# Patient Record
Sex: Male | Born: 1940 | Race: White | Hispanic: No | Smoking: Former smoker
Health system: Southern US, Community
[De-identification: ages and names within clinical notes are randomized; demographics above are authoritative.]

## PROBLEM LIST (undated history)

## (undated) DIAGNOSIS — E559 Vitamin D deficiency, unspecified: Secondary | ICD-10-CM

## (undated) DIAGNOSIS — E875 Hyperkalemia: Secondary | ICD-10-CM

## (undated) DIAGNOSIS — IMO0001 Reserved for inherently not codable concepts without codable children: Secondary | ICD-10-CM

## (undated) DIAGNOSIS — I10 Essential (primary) hypertension: Secondary | ICD-10-CM

## (undated) DIAGNOSIS — Q539 Undescended testicle, unspecified: Secondary | ICD-10-CM

## (undated) DIAGNOSIS — N184 Chronic kidney disease, stage 4 (severe): Secondary | ICD-10-CM

## (undated) DIAGNOSIS — E119 Type 2 diabetes mellitus without complications: Secondary | ICD-10-CM

## (undated) DIAGNOSIS — C61 Malignant neoplasm of prostate: Secondary | ICD-10-CM

## (undated) HISTORY — DX: Undescended testicle, unspecified: Q53.9

## (undated) HISTORY — PX: COLONOSCOPY: SHX174

## (undated) HISTORY — DX: Essential (primary) hypertension: I10

## (undated) HISTORY — DX: Malignant neoplasm of prostate: C61

## (undated) HISTORY — DX: Reserved for inherently not codable concepts without codable children: IMO0001

## (undated) HISTORY — DX: Hyperkalemia: E87.5

## (undated) HISTORY — DX: Vitamin D deficiency, unspecified: E55.9

## (undated) HISTORY — DX: Type 2 diabetes mellitus without complications: E11.9

---

## 1999-01-24 HISTORY — PX: REFRACTIVE SURGERY: SHX103

## 2008-05-25 HISTORY — PX: FOOT AMPUTATION: SHX951

## 2009-01-30 ENCOUNTER — Ambulatory Visit: Payer: Self-pay | Admitting: Family Medicine

## 2009-01-30 ENCOUNTER — Inpatient Hospital Stay (HOSPITAL_COMMUNITY): Admission: EM | Admit: 2009-01-30 | Discharge: 2009-02-08 | Payer: Self-pay | Admitting: Family Medicine

## 2009-02-01 ENCOUNTER — Encounter (INDEPENDENT_AMBULATORY_CARE_PROVIDER_SITE_OTHER): Payer: Self-pay | Admitting: Orthopedic Surgery

## 2009-02-06 ENCOUNTER — Encounter (INDEPENDENT_AMBULATORY_CARE_PROVIDER_SITE_OTHER): Payer: Self-pay | Admitting: Orthopedic Surgery

## 2009-12-10 ENCOUNTER — Encounter: Payer: Self-pay | Admitting: Gastroenterology

## 2010-01-06 ENCOUNTER — Encounter (INDEPENDENT_AMBULATORY_CARE_PROVIDER_SITE_OTHER): Payer: Self-pay | Admitting: *Deleted

## 2010-01-09 ENCOUNTER — Encounter (INDEPENDENT_AMBULATORY_CARE_PROVIDER_SITE_OTHER): Payer: Self-pay | Admitting: *Deleted

## 2010-01-09 ENCOUNTER — Ambulatory Visit: Payer: Self-pay | Admitting: Gastroenterology

## 2010-01-23 ENCOUNTER — Ambulatory Visit: Payer: Self-pay | Admitting: Gastroenterology

## 2010-02-03 ENCOUNTER — Telehealth: Payer: Self-pay | Admitting: Gastroenterology

## 2010-02-04 ENCOUNTER — Ambulatory Visit: Payer: Self-pay | Admitting: Gastroenterology

## 2010-02-04 ENCOUNTER — Encounter (INDEPENDENT_AMBULATORY_CARE_PROVIDER_SITE_OTHER): Payer: Self-pay | Admitting: *Deleted

## 2010-02-18 ENCOUNTER — Ambulatory Visit: Payer: Self-pay | Admitting: Gastroenterology

## 2010-02-19 LAB — HM COLONOSCOPY

## 2010-06-24 NOTE — Procedures (Signed)
Summary: Colonoscopy  Patient: Phillip Herring Note: All result statuses are Final unless otherwise noted.  Tests: (1) Colonoscopy (COL)   COL Colonoscopy           DONE     [facility]     [address]     [place], [place]  [address]           COLONOSCOPY PROCEDURE REPORT           PATIENT:  [name], [name]  MR#:  [id number]     BIRTHDATE:  1941-02-01, 69 yrs. old  GENDER:  male     ENDOSCOPIST:  Judie Petit T. Russella Dar, MD, [facility]     Referred by:  Lynnea Ferrier, M.D.     PROCEDURE DATE:  02/18/2010     PROCEDURE:  Average-risk screening colonoscopy G0121     ASA CLASS:  Class II     INDICATIONS:  1) Routine Risk Screening     MEDICATIONS:   Fentanyl 50 mcg IV, Versed 8 mg IV     DESCRIPTION OF PROCEDURE:   After the risks benefits and     alternatives of the procedure were thoroughly explained, informed     consent was obtained.  Digital rectal exam was performed and     revealed no abnormalities.  The LB PCF-H180AL [id number] endoscope     was introduced through the anus and advanced to the cecum, which     was identified by both the appendix and ileocecal valve, limited     by a tortuous and redundant colon, fair prep.    The quality of     the prep was fair, using Colyte, after extensive rinsing and     suctioning during the procedure. The instrument was then slowly     withdrawn as the colon was fully examined.     <<PROCEDUREIMAGES>>     FINDINGS:  A normal appearing cecum, ileocecal valve, and     appendiceal orifice were identified. The ascending, hepatic     flexure, transverse, splenic flexure, descending, sigmoid colon,     and rectum appeared unremarkable.   Retroflexed views in the     rectum revealed no abnormalities.    The time to cecum =  16.67     minutes. The scope was then withdrawn (time =  18  min) from the     patient and the procedure completed.           COMPLICATIONS:  None           ENDOSCOPIC IMPRESSION:     1) Normal colon            RECOMMENDATIONS:     1) Repeat Colonoscopy in 5 years for routine CRC screening, due     to fair prep, with an extended bowel prep     2) High fiber diet with liberal fluid intake.           Venita Lick. Russella Dar, MD, [facility]           n.     eSIGNED:   Venita Lick. Stark at 02/18/2010 03:29 PM           Hammack, [name], [id number]  Note: An exclamation mark (!) indicates a result that was not dispersed into the flowsheet. Document Creation Date: 02/18/2010 3:30 PM _______________________________________________________________________  (1) Order result status: Final Collection or observation date-time: 02/18/2010 15:25 Requested date-time:  Receipt date-time:  Reported date-time:  Referring Physician:   Ordering Physician: Judie Petit  Russella Dar [id number]) Specimen Source:  Source: Launa Grill Order Number: [id number] Lab site:   Appended Document: Colonoscopy    Clinical Lists Changes  Observations: Added new observation of COLONNXTDUE: 01/2015 (02/18/2010 16:00)

## 2010-06-24 NOTE — Miscellaneous (Signed)
Summary: [facility] Previsit/prep  Clinical Lists Changes  Medications: Added new medication of COLYTE WITH FLAVOR PACKS 240 GM  SOLR (PEG 3350-KCL-NABCB-NACL-NASULF) As per prep instructions. - Signed Rx of COLYTE WITH FLAVOR PACKS 240 GM  SOLR (PEG 3350-KCL-NABCB-NACL-NASULF) As per prep instructions.;  #1 x 0;  Signed;  Entered by: Wyona Almas RN;  Authorized by: Meryl Dare MD [facility];  Method used: [facility]. [phone]*, [address], [place], [place], [place]  [address], Ph: [phone] or [phone], Fax: [phone] Observations: Added new observation of NKA: T (02/04/2010 13:48)    Prescriptions: COLYTE WITH FLAVOR PACKS 240 GM  SOLR (PEG 3350-KCL-NABCB-NACL-NASULF) As per prep instructions.  #1 x 0   Entered by:   Wyona Almas RN   Authorized by:   Meryl Dare MD [facility]   Signed by:   Wyona Almas RN on 02/04/2010   Method used:   Electronically to        [facility]. [phone]* (retail)       [address]       [place], [place]  [address]       Ph: [phone] or [phone]       Fax: [phone]   RxID:   [id number]

## 2010-06-24 NOTE — Letter (Signed)
Summary: Previsit letter  [facility]  [address] [place], [place] [address]   Phone: [phone]  Fax: [phone]       12/10/2009 MRN: [id number]  [facility] [address] [place], [place]  [address]  Dear Mr. Berringer,  Welcome to the Gastroenterology Division at [facility].    You are scheduled to see a nurse for your pre-procedure visit on 01-09-10 at 9am on the 3rd floor at [facility], [address]  We ask that you try to arrive at our office 15 minutes prior to your appointment time to allow for check-in.  Your nurse visit will consist of discussing your medical and surgical history, your immediate family medical history, and your medications.    Please bring a complete list of all your medications or, if you prefer, bring the medication bottles and we will list them.  We will need to be aware of both prescribed and over the counter drugs.  We will need to know exact dosage information as well.  If you are on blood thinners (Coumadin, Plavix, Aggrenox, Ticlid, etc.) please call our office today/prior to your appointment, as we need to consult with your physician about holding your medication.   Please be prepared to read and sign documents such as consent forms, a financial agreement, and acknowledgement forms.  If necessary, and with your consent, a friend or relative is welcome to sit-in on the nurse visit with you.  Please bring your insurance card so that we may make a copy of it.  If your insurance requires a referral to see a specialist, please bring your referral form from your primary care physician.  No co-pay is required for this nurse visit.     If you cannot keep your appointment, please call [phone] to cancel or reschedule prior to your appointment date.  This allows [place] the opportunity to schedule an appointment for another patient in need of care.    Thank you for choosing [facility] for your medical needs.  We  appreciate the opportunity to care for you.  Please visit [place] at our website  to learn more about our practice.                     Sincerely.                                                                                                                   The Gastroenterology Division

## 2010-06-24 NOTE — Letter (Signed)
Summary: Diabetic Instructions  [facility]  [address] [place], [place] [address]   Phone: [phone]  Fax: [phone]    KYI Leard March 03, 1941 MRN: [id number]   _X  _   ORAL DIABETIC MEDICATION INSTRUCTIONS  The day before your procedure:   Take your diabetic pill as you do normally  The day of your procedure:   Do not take your diabetic pill    We will check your blood sugar levels during the admission process and again in Recovery before discharging you home  ________________________________________________________________________

## 2010-06-24 NOTE — Procedures (Signed)
Summary: Colonoscopy  Patient: Phillip Herring Note: All result statuses are Final unless otherwise noted.  Tests: (1) Colonoscopy (COL)   COL Colonoscopy           DONE (C)     [facility]     [address]     [place], [place]  [address]           COLONOSCOPY PROCEDURE REPORT           PATIENT:  [name], [name]  MR#:  [id number]     BIRTHDATE:  1941/02/22, 69 yrs. old  GENDER:  male     ENDOSCOPIST:  Judie Petit T. Russella Dar, MD, [facility]     Referred by:  Lynnea Ferrier, M.D.     PROCEDURE DATE:  01/23/2010     PROCEDURE:  Colonoscopy 09[address]     ASA CLASS:  Class II     INDICATIONS:  1) Routine Risk Screening     MEDICATIONS:   Fentanyl 50 mcg IV, Versed 6 mg IV     DESCRIPTION OF PROCEDURE:   After the risks benefits and     alternatives of the procedure were thoroughly explained, informed     consent was obtained.  Digital rectal exam was performed and     revealed no abnormalities.  The LB PCF-H180AL [id number] endoscope     was introduced through the anus and advanced to the mid transverse     colon, limited by poor preparation.  The quality of the prep was     poor, using MoviPrep. The instrument was then slowly withdrawn as     the colon was examined.     <<PROCEDUREIMAGES>>     FINDINGS:  A normal appearing transverse, splenic flexure,     descending, sigmoid colon, and rectum appeared unremarkable     however the views were severely limited by retained solid and     liquid stool such that the exam was stopped at the transverse     colon. Although large lesions in the sigmoid and rectum were     adeqautely excluded, the exam from the descending to the mid     transverse was inadequate.  Retroflexed views in the rectum     revealed no abnormalities. The time to cecum =  minutes. The scope     was then withdrawn (time =  min) from the patient and the     procedure completed.           COMPLICATIONS:  None           ENDOSCOPIC IMPRESSION:     1) Incomplete colonoscopy exam due to  a poor prep           RECOMMENDATIONS:     1) Repeat Colonoscopy with a more extensive bowel prep           Malcolm T. Russella Dar, MD, [facility]           n.     REVISED:  01/28/2010 08:36 AM     eSIGNED:   Venita Lick. Stark at 01/28/2010 08:36 AM           [name], [name], [phone]  Note: An exclamation mark (!) indicates a result that was not dispersed into the flowsheet. Document Creation Date: 01/28/2010 8:37 AM _______________________________________________________________________  (1) Order result status: Final Collection or observation date-time: 01/23/2010 12:05 Requested date-time:  Receipt date-time:  Reported date-time:  Referring Physician:   Ordering Physician: Claudette Head [phone]) Specimen Source:  Source: End[facility]  Filler Order Number: [id number] Lab site:

## 2010-06-24 NOTE — Miscellaneous (Signed)
Summary: [facility] Previsit/prep  Clinical Lists Changes  Medications: Added new medication of MOVIPREP 100 GM  SOLR (PEG-KCL-NACL-NASULF-NA ASC-C) As per prep instructions. - Signed Rx of MOVIPREP 100 GM  SOLR (PEG-KCL-NACL-NASULF-NA ASC-C) As per prep instructions.;  #1 x 0;  Signed;  Entered by: Wyona Almas RN;  Authorized by: Meryl Dare MD [facility];  Method used: [facility] to [facility]. [phone]*, [address], [place], [place], [place]  [address], Ph: [phone] or [phone], Fax: [phone] Observations: Added new observation of NKA: T (01/09/2010 8:54)    Prescriptions: MOVIPREP 100 GM  SOLR (PEG-KCL-NACL-NASULF-NA ASC-C) As per prep instructions.  #1 x 0   Entered by:   Wyona Almas RN   Authorized by:   Meryl Dare MD [facility]   Signed by:   Wyona Almas RN on 01/09/2010   Method used:   Electronically to        [facility]. [phone]* (retail)       [address]       [place], [place]  [address]       Ph: [phone] or [phone]       Fax: [phone]   RxID:   [id number]

## 2010-06-24 NOTE — Progress Notes (Signed)
Summary: more extensive bowel prep  Phone Note Other Incoming   Summary of Call: Mag Citirate, one bottle, in the evening 3 days before colonoscopy Clear liquid diet and one bottle of Mag Citrate starting after 4pm on the day before the full day of clear liquids, 2 days before colonoscopy Mag Citrate the morning of the full day of clear liquids, 1 day before colonoscopy GoLytely one gallon over 3-4 hours the evening before colonoscopy If stool not completely clear after above take one bottle of Mag Citrate 4-6 hours before the colonoscopy Initial call taken by: Meryl Dare MD FACG,  February 03, 2010 8:08 PM    Dr. Russella Dar, Mr. Seymour is coming in for his previsit tomorrow 02/04/10 at 2:00pm.  You said in his colon report to give him more extensive prep this time.  What do you suggest?  Go-Lytely with dulcolax, and extra day of clear liquids, Two day prep with Miralax 2 days before and Movi the day before and morning of exam ...? Thank you, Alvino Chapel

## 2010-06-24 NOTE — Letter (Signed)
Summary: [facility] Instructions  [facility]  [address] [place], [place] [address]   Phone: [phone]  Fax: [phone]       Phillip Herring    1941-04-15    MRN: [id number]       Procedure Day Dorna Bloom:  Jake Shark  02/18/10     Arrival Time: 2:00PM     Procedure Time:  3:00PM     Location of Procedure:                    _X _  [facility] (4th Floor)    PREPARATION FOR COLONOSCOPY WITH EXTENSIVE PREP  Starting 5 days prior to your procedure 02/13/10 do not eat nuts, seeds, popcorn, corn, beans, peas,  salads, or any raw vegetables.  Do not take any fiber supplements (e.g. Metamucil, Citrucel, and Benefiber). ____________________________________________________________________________________________________   02/15/10 SATURDAY  1.  5:00PM drink 1 bottle of Magnesium Citrate.    02/16/10 SUNDAY  1. Drink clear liquids the entire day -NO SOLID FOOD                               2. 5:00PM  Drink 1 bottle of Magnesium Citrate.    THE DAY BEFORE YOUR PROCEDURE         DATE: 02/17/10 DAY: MONDAY  1   Drink clear liquids the entire day-NO SOLID FOOD  2   Do not drink anything colored red or purple.  Avoid juices with pulp.  No orange juice.  3   Drink at least 64 oz. (8 glasses) of fluid/clear liquids during the day to prevent dehydration and help the prep work efficiently.  CLEAR LIQUIDS INCLUDE: Water Jello Ice Popsicles Tea (sugar ok, no milk/cream) Powdered fruit flavored drinks Coffee (sugar ok, no milk/cream) Gatorade Juice: apple, white grape, white cranberry  Lemonade Clear bullion, consomm, broth Carbonated beverages (any kind) Strained chicken noodle soup Hard Candy  4   Mix the solution (GoLytely) with water to the fill line   in the morning and put in the refrigerator to chill.  5   At 3:00  drink the GoLytely over a 3-4 hour period.  Drink all of the liquid.         THE DAY OF YOUR PROCEDURE      DATE:  02/18/10  DAY:  TUESDAY  If your stool is not completely clear, drink 1 bottle of Magnesium Citrate at 10:00AM.  You may drink clear liquids until 1:00pm  (2 HOURS BEFORE PROCEDURE).   MEDICATION INSTRUCTIONS  Unless otherwise instructed, you should take regular prescription medications with a small sip of water as early as possible the morning of your procedure.  Diabetic patients - see separate instructions.          OTHER INSTRUCTIONS  You will need a responsible adult at least 68 years of age to accompany you and drive you home.   This person must remain in the waiting room during your procedure.  Wear loose fitting clothing that is easily removed.  Leave jewelry and other valuables at home.  However, you may wish to bring a book to read or an iPod/MP3 player to listen to music as you wait for your procedure to start.  Remove all body piercing jewelry and leave at home.  Total time from sign-in until discharge is approximately 2-3 hours.  You should go home directly after your procedure and rest.  You  can resume normal activities the day after your procedure.  The day of your procedure you should not:   Drive   Make legal decisions   Operate machinery   Drink alcohol   Return to work  You will receive specific instructions about eating, activities and medications before you leave.   The above instructions have been reviewed and explained to me by  Wyona Almas RN  February 04, 2010 2:25 PM     I fully understand and can verbalize these instructions _____________________________ Date _______

## 2010-06-24 NOTE — Letter (Signed)
Summary: Diabetic Instructions  [facility]  [address] [place], [place] [address]   Phone: [phone]  Fax: [phone]    Phillip Herring Wheat [birth year] MRN: [id number]   _x  _   ORAL DIABETIC MEDICATION INSTRUCTIONS  The day before your procedure:   Take your diabetic pill as you do normally  The day of your procedure:   Do not take your diabetic pill    We will check your blood sugar levels during the admission process and again in Recovery before discharging you home  ________________________________________________________________________

## 2010-06-24 NOTE — Letter (Signed)
Summary: [facility] Instructions  [facility]  [address] [place], [place] [address]   Phone: [phone]  Fax: [phone]       Phillip Herring    09-14-1940    MRN: [id number]        Procedure Day /Date:  01/23/10 Thursday     Arrival Time:  10:30am     Procedure Time:  11:30am     Location of Procedure:                    _ x_   [facility] (4th Floor)                        PREPARATION FOR COLONOSCOPY WITH MOVIPREP   Starting 5 days prior to your procedure 01/18/10  do not eat nuts, seeds, popcorn, corn, beans, peas,  salads, or any raw vegetables.  Do not take any fiber supplements (e.g. Metamucil, Citrucel, and Benefiber).  THE DAY BEFORE YOUR PROCEDURE         DATE:  01/22/10  DAY:  Wednesday  1.  Drink clear liquids the entire day-NO SOLID FOOD  2.  Do not drink anything colored red or purple.  Avoid juices with pulp.  No orange juice.  3.  Drink at least 64 oz. (8 glasses) of fluid/clear liquids during the day to prevent dehydration and help the prep work efficiently.  CLEAR LIQUIDS INCLUDE: Water Jello Ice Popsicles Tea (sugar ok, no milk/cream) Powdered fruit flavored drinks Coffee (sugar ok, no milk/cream) Gatorade Juice: apple, white grape, white cranberry  Lemonade Clear bullion, consomm, broth Carbonated beverages (any kind) Strained chicken noodle soup Hard Candy                             4.  In the morning, mix first dose of MoviPrep solution:    Empty 1 Pouch A and 1 Pouch B into the disposable container    Add lukewarm drinking water to the top line of the container. Mix to dissolve    Refrigerate (mixed solution should be used within 24 hrs)  5.  Begin drinking the prep at 5:00 p.m. The MoviPrep container is divided by 4 marks.   Every 15 minutes drink the solution down to the next mark (approximately 8 oz) until the full liter is complete.   6.  Follow completed prep with 16 oz of clear liquid of your choice  (Nothing red or purple).  Continue to drink clear liquids until bedtime.  7.  Before going to bed, mix second dose of MoviPrep solution:    Empty 1 Pouch A and 1 Pouch B into the disposable container    Add lukewarm drinking water to the top line of the container. Mix to dissolve    Refrigerate  THE DAY OF YOUR PROCEDURE      DATE:  01/23/10  DAY:  Thursday  Beginning at    6:30 a.m. (5 hours before procedure):         1. Every 15 minutes, drink the solution down to the next mark (approx 8 oz) until the full liter is complete.  2. Follow completed prep with 16 oz. of clear liquid of your choice.    3. You may drink clear liquids until  9:30am  (2 HOURS BEFORE PROCEDURE).   MEDICATION INSTRUCTIONS  Unless otherwise instructed, you should take regular prescription medications with a small sip  of water   as early as possible the morning of your procedure.  Diabetic patients - see separate instructions.        OTHER INSTRUCTIONS  You will need a responsible adult at least 68 years of age to accompany you and drive you home.   This person must remain in the waiting room during your procedure.  Wear loose fitting clothing that is easily removed.  Leave jewelry and other valuables at home.  However, you may wish to bring a book to read or  an iPod/MP3 player to listen to music as you wait for your procedure to start.  Remove all body piercing jewelry and leave at home.  Total time from sign-in until discharge is approximately 2-3 hours.  You should go home directly after your procedure and rest.  You can resume normal activities the  day after your procedure.  The day of your procedure you should not:   Drive   Make legal decisions   Operate machinery   Drink alcohol   Return to work  You will receive specific instructions about eating, activities and medications before you leave.    The above instructions have been reviewed and explained to me by   Wyona Almas RN  January 09, 2010 9:23 AM     I fully understand and can verbalize these instructions _____________________________ Date _________

## 2010-08-07 LAB — GLUCOSE, CAPILLARY
Glucose-Capillary: 101 mg/dL — ABNORMAL HIGH (ref 70–99)
Glucose-Capillary: 92 mg/dL (ref 70–99)
Glucose-Capillary: 80 mg/dL (ref 70–99)

## 2010-08-26 ENCOUNTER — Encounter: Payer: Self-pay | Admitting: Physician Assistant

## 2010-08-29 LAB — BASIC METABOLIC PANEL
BUN: 10 mg/dL (ref 6–23)
BUN: 36 mg/dL — ABNORMAL HIGH (ref 6–23)
BUN: 40 mg/dL — ABNORMAL HIGH (ref 6–23)
CO2: 28 mEq/L (ref 19–32)
Calcium: 8.4 mg/dL (ref 8.4–10.5)
Chloride: 101 mEq/L (ref 96–112)
Chloride: 102 mEq/L (ref 96–112)
Creatinine, Ser: 1.1 mg/dL (ref 0.4–1.5)
Creatinine, Ser: 1.1 mg/dL (ref 0.4–1.5)
GFR calc Af Amer: >60 mL/min (ref >60)
GFR calc Af Amer: >60 mL/min (ref >60)
GFR calc Af Amer: >60 mL/min (ref >60)
GFR calc non Af Amer: 41 mL/min — ABNORMAL LOW (ref >60)
GFR calc non Af Amer: >60 mL/min (ref >60)
GFR calc non Af Amer: >60 mL/min (ref >60)
GFR calc non Af Amer: >60 mL/min (ref >60)
Glucose, Bld: 158 mg/dL — ABNORMAL HIGH (ref 70–99)
Glucose, Bld: 175 mg/dL — ABNORMAL HIGH (ref 70–99)
Glucose, Bld: 352 mg/dL — ABNORMAL HIGH (ref 70–99)
Potassium: 4.1 mEq/L (ref 3.5–5.1)
Potassium: 4.2 mEq/L (ref 3.5–5.1)
Potassium: 4.4 mEq/L (ref 3.5–5.1)
Potassium: 4.4 mEq/L (ref 3.5–5.1)
Sodium: 134 mEq/L — ABNORMAL LOW (ref 135–145)
Sodium: 138 mEq/L (ref 135–145)

## 2010-08-29 LAB — GLUCOSE, CAPILLARY
Glucose-Capillary: 127 mg/dL — ABNORMAL HIGH (ref 70–99)
Glucose-Capillary: 144 mg/dL — ABNORMAL HIGH (ref 70–99)
Glucose-Capillary: 188 mg/dL — ABNORMAL HIGH (ref 70–99)
Glucose-Capillary: 130 mg/dL — ABNORMAL HIGH (ref 70–99)
Glucose-Capillary: 130 mg/dL — ABNORMAL HIGH (ref 70–99)
Glucose-Capillary: 135 mg/dL — ABNORMAL HIGH (ref 70–99)
Glucose-Capillary: 137 mg/dL — ABNORMAL HIGH (ref 70–99)
Glucose-Capillary: 142 mg/dL — ABNORMAL HIGH (ref 70–99)
Glucose-Capillary: 155 mg/dL — ABNORMAL HIGH (ref 70–99)
Glucose-Capillary: 157 mg/dL — ABNORMAL HIGH (ref 70–99)
Glucose-Capillary: 159 mg/dL — ABNORMAL HIGH (ref 70–99)
Glucose-Capillary: 177 mg/dL — ABNORMAL HIGH (ref 70–99)
Glucose-Capillary: 184 mg/dL — ABNORMAL HIGH (ref 70–99)
Glucose-Capillary: 191 mg/dL — ABNORMAL HIGH (ref 70–99)
Glucose-Capillary: 210 mg/dL — ABNORMAL HIGH (ref 70–99)

## 2010-08-29 LAB — WOUND CULTURE

## 2010-08-29 LAB — CBC
HCT: 28.3 % — ABNORMAL LOW (ref 39.0–52.0)
HCT: 30.5 % — ABNORMAL LOW (ref 39.0–52.0)
Hemoglobin: 10.4 g/dL — ABNORMAL LOW (ref 13.0–17.0)
MCHC: 33.6 g/dL (ref 30.0–36.0)
MCV: 94.4 fL (ref 78.0–100.0)
MCV: 94.4 fL (ref 78.0–100.0)
MCV: 94.9 fL (ref 78.0–100.0)
MCV: 95.2 fL (ref 78.0–100.0)
Platelets: 244 10*3/uL (ref 150–400)
Platelets: 244 10*3/uL (ref 150–400)
Platelets: 372 10*3/uL (ref 150–400)
Platelets: 423 10*3/uL — ABNORMAL HIGH (ref 150–400)
RBC: 3.32 MIL/uL — ABNORMAL LOW (ref 4.22–5.81)
RBC: 3.38 MIL/uL — ABNORMAL LOW (ref 4.22–5.81)
RDW: 12.2 % (ref 11.5–15.5)
RDW: 12.6 % (ref 11.5–15.5)
WBC: 12.5 10*3/uL — ABNORMAL HIGH (ref 4.0–10.5)
WBC: 14.8 10*3/uL — ABNORMAL HIGH (ref 4.0–10.5)
WBC: 20.1 10*3/uL — ABNORMAL HIGH (ref 4.0–10.5)

## 2010-08-29 LAB — CULTURE, BLOOD (ROUTINE X 2)
Culture: NO GROWTH
Culture: NO GROWTH

## 2010-08-29 LAB — CULTURE, ROUTINE-ABSCESS

## 2010-08-29 LAB — ANAEROBIC CULTURE

## 2010-10-17 ENCOUNTER — Encounter (INDEPENDENT_AMBULATORY_CARE_PROVIDER_SITE_OTHER): Payer: Medicare Other

## 2010-10-17 DIAGNOSIS — I739 Peripheral vascular disease, unspecified: Secondary | ICD-10-CM

## 2010-10-19 IMAGING — CR DG CHEST 2V
2 series · 2 of 2 positions shown · non-contrast
Comparison: None available.

CLINICAL DATA: Fever, abnormal physical exam.

CHEST - 2 VIEW

[w chest pa]
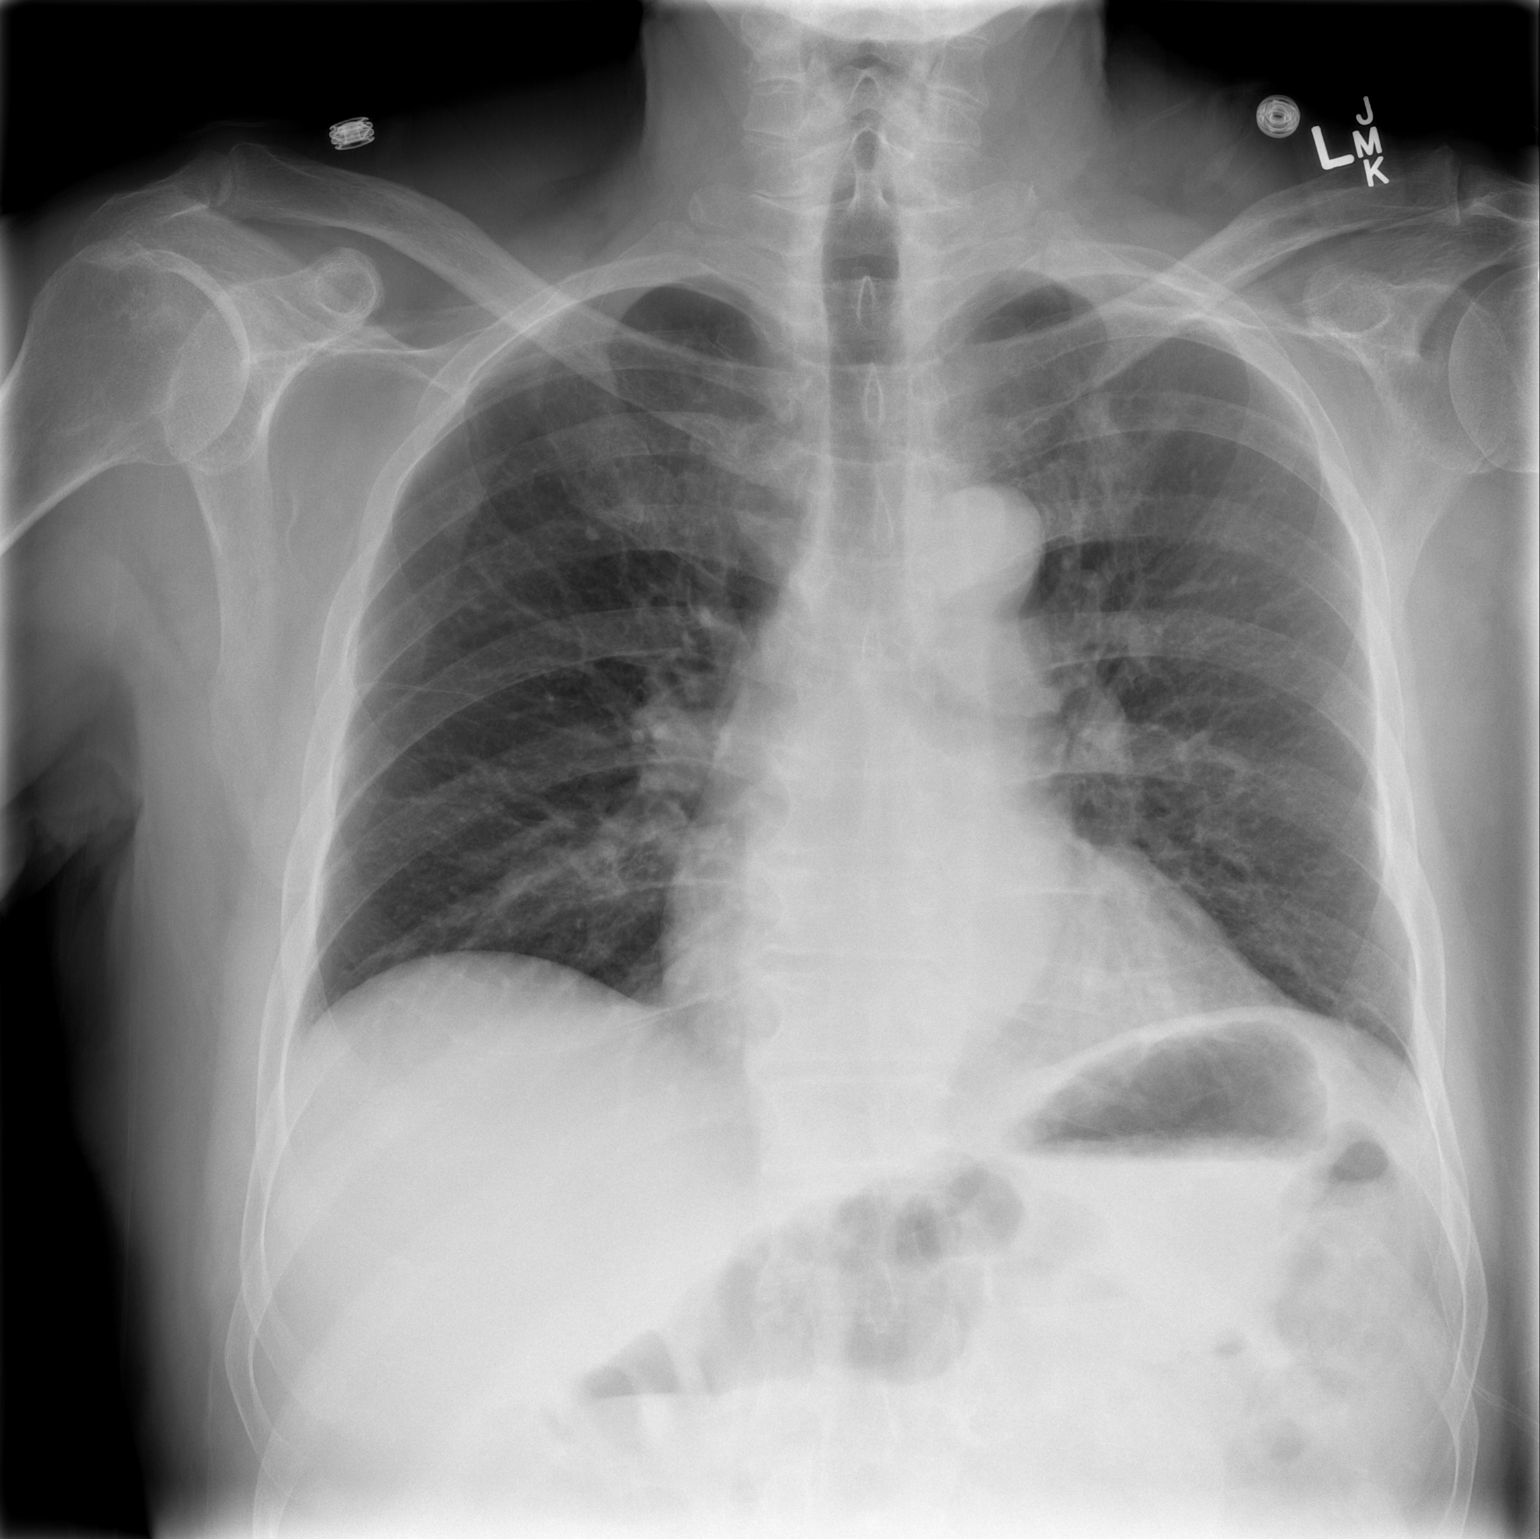

[w chest lat]
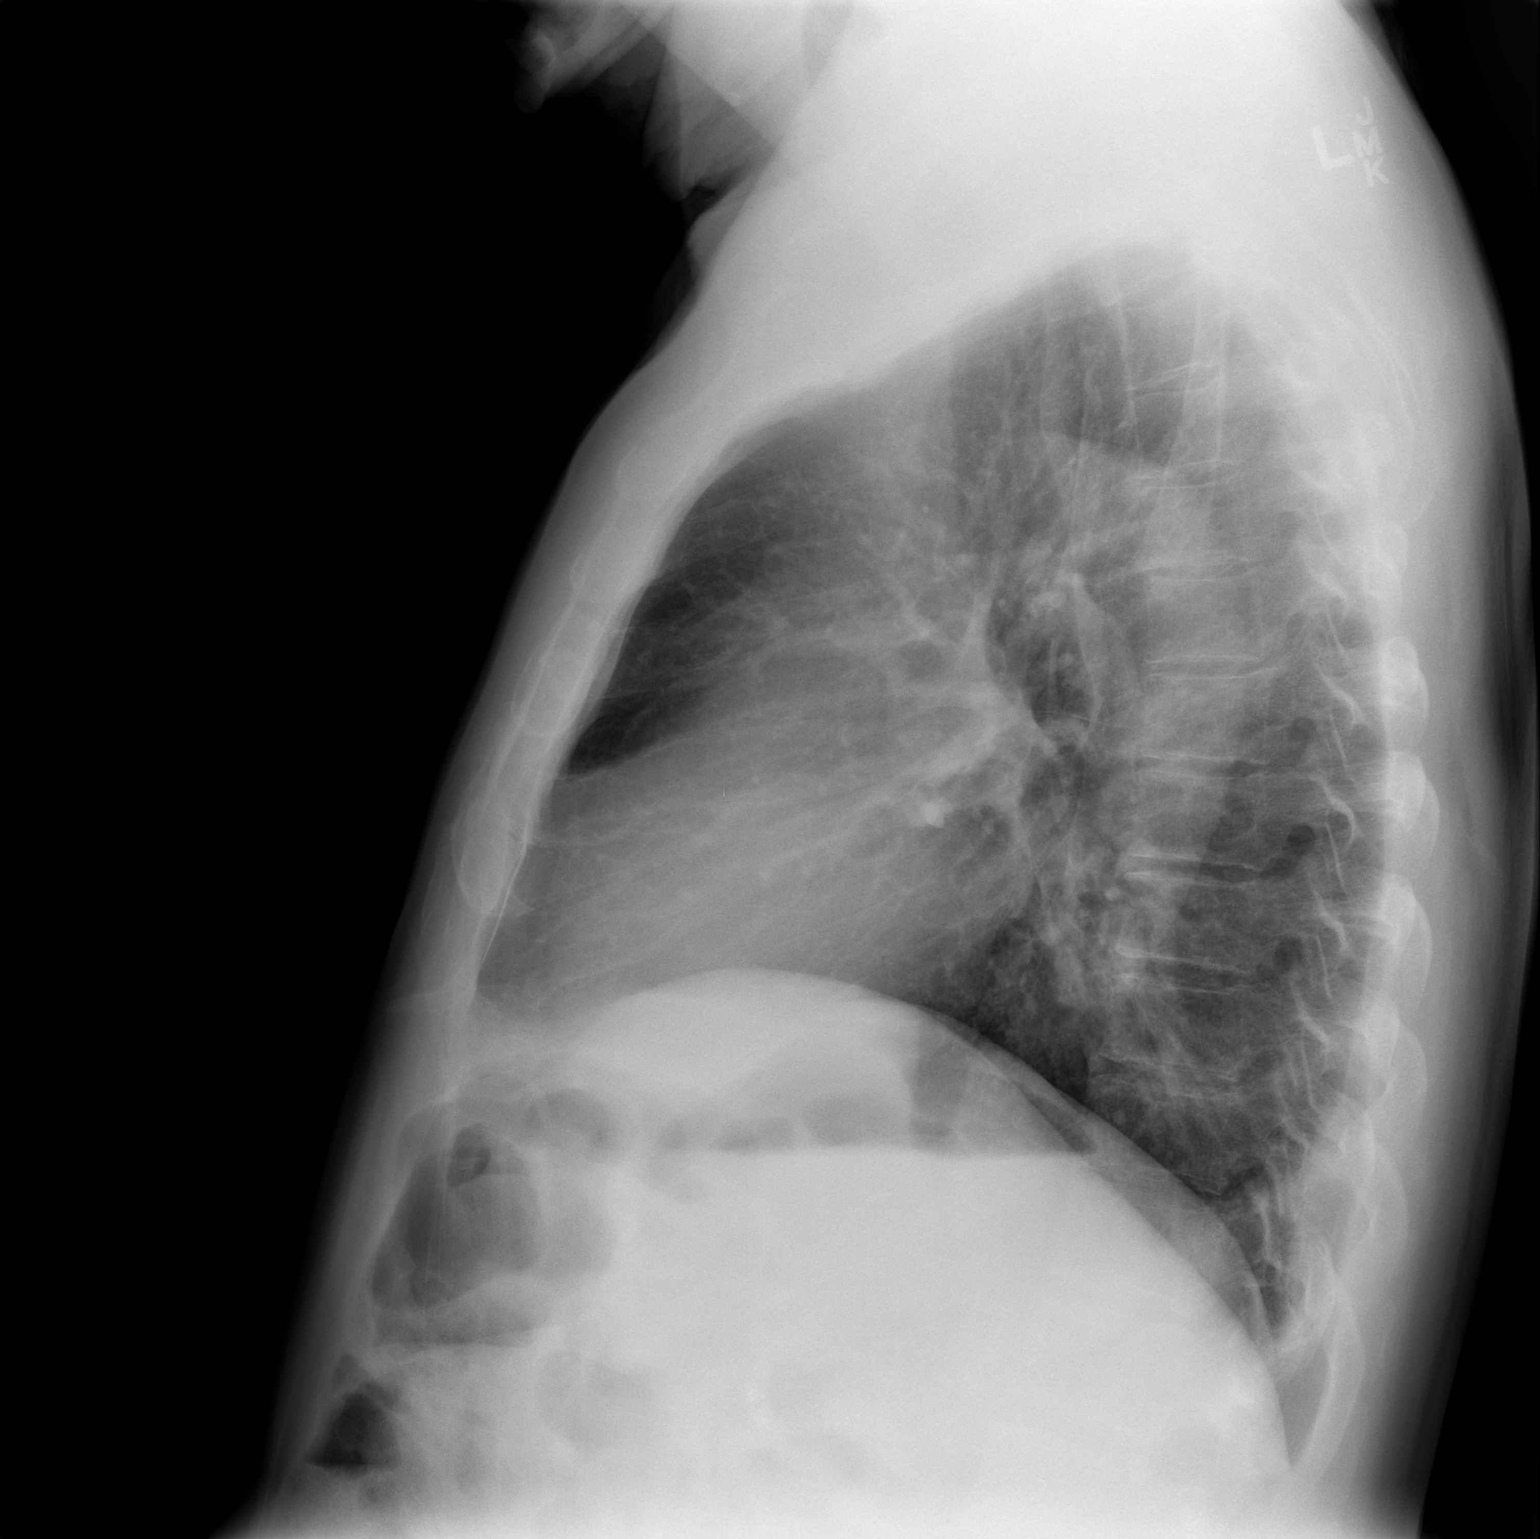

[2 of 2 positions shown; findings below may reference images not displayed]

FINDINGS: The lungs are clear.  Heart size is upper normal.  No
pleural effusion or focal bony abnormality.
IMPRESSION: No acute disease.

## 2010-11-25 ENCOUNTER — Encounter: Payer: Self-pay | Admitting: Family Medicine

## 2010-11-25 DIAGNOSIS — E118 Type 2 diabetes mellitus with unspecified complications: Secondary | ICD-10-CM | POA: Insufficient documentation

## 2010-11-25 DIAGNOSIS — Q539 Undescended testicle, unspecified: Secondary | ICD-10-CM | POA: Insufficient documentation

## 2010-11-25 DIAGNOSIS — C61 Malignant neoplasm of prostate: Secondary | ICD-10-CM | POA: Insufficient documentation

## 2010-11-25 DIAGNOSIS — I10 Essential (primary) hypertension: Secondary | ICD-10-CM | POA: Insufficient documentation

## 2011-02-09 ENCOUNTER — Encounter (HOSPITAL_BASED_OUTPATIENT_CLINIC_OR_DEPARTMENT_OTHER): Payer: Medicare Other | Attending: Internal Medicine

## 2011-02-09 DIAGNOSIS — G609 Hereditary and idiopathic neuropathy, unspecified: Secondary | ICD-10-CM | POA: Insufficient documentation

## 2011-02-09 DIAGNOSIS — E1169 Type 2 diabetes mellitus with other specified complication: Secondary | ICD-10-CM | POA: Insufficient documentation

## 2011-02-09 DIAGNOSIS — L97509 Non-pressure chronic ulcer of other part of unspecified foot with unspecified severity: Secondary | ICD-10-CM | POA: Insufficient documentation

## 2011-02-09 DIAGNOSIS — Z79899 Other long term (current) drug therapy: Secondary | ICD-10-CM | POA: Insufficient documentation

## 2011-02-16 ENCOUNTER — Ambulatory Visit (HOSPITAL_COMMUNITY)
Admission: RE | Admit: 2011-02-16 | Discharge: 2011-02-16 | Disposition: A | Discharge status: Home / Self Care | Payer: Medicare Other | Source: Ambulatory Visit | Attending: Internal Medicine | Admitting: Internal Medicine

## 2011-02-16 ENCOUNTER — Other Ambulatory Visit (HOSPITAL_BASED_OUTPATIENT_CLINIC_OR_DEPARTMENT_OTHER): Payer: Self-pay | Admitting: Internal Medicine

## 2011-02-16 DIAGNOSIS — X58XXXA Exposure to other specified factors, initial encounter: Secondary | ICD-10-CM | POA: Insufficient documentation

## 2011-02-16 DIAGNOSIS — M869 Osteomyelitis, unspecified: Secondary | ICD-10-CM

## 2011-02-16 DIAGNOSIS — M899 Disorder of bone, unspecified: Secondary | ICD-10-CM | POA: Insufficient documentation

## 2011-02-16 DIAGNOSIS — IMO0002 Reserved for concepts with insufficient information to code with codable children: Secondary | ICD-10-CM | POA: Insufficient documentation

## 2011-02-23 ENCOUNTER — Encounter (HOSPITAL_BASED_OUTPATIENT_CLINIC_OR_DEPARTMENT_OTHER): Payer: Medicare Other | Attending: Internal Medicine

## 2011-02-23 DIAGNOSIS — Z79899 Other long term (current) drug therapy: Secondary | ICD-10-CM | POA: Insufficient documentation

## 2011-02-23 DIAGNOSIS — L97509 Non-pressure chronic ulcer of other part of unspecified foot with unspecified severity: Secondary | ICD-10-CM | POA: Insufficient documentation

## 2011-02-23 DIAGNOSIS — E1169 Type 2 diabetes mellitus with other specified complication: Secondary | ICD-10-CM | POA: Insufficient documentation

## 2011-02-23 DIAGNOSIS — G609 Hereditary and idiopathic neuropathy, unspecified: Secondary | ICD-10-CM | POA: Insufficient documentation

## 2011-04-13 NOTE — H&P (Signed)
  NAME:  Phillip Herring, Phillip Herring NO.:  [id number]  MEDICAL RECORD NO.:  [id number]  LOCATION:  FOOT                         FACILITY:  [facility]  PHYSICIAN:  Ardath Sax, M.D.     DATE OF BIRTH:  24-Apr-1941  DATE OF ADMISSION:  02/09/2011 DATE OF DISCHARGE:                             HISTORY & PHYSICAL   The patient came here on February 09, 2011, for evaluation of his ulcers on the tip of his toes on the left foot.  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  On the tips of all the other toes are little scabs of Wagner I ulcers and I did not debride these off.  He is very edematous, and I gave him a prescription for Lasix and we noted that his ABI was about 1 and we will plan on wrapping him in Unna boots when he comes back next time.  We will also on this occasion put Silvercel on the diabetic foot ulcers.  He will come back in 1 week.  He is on several medicines including metoprolol.  He is also on gabapentin for his peripheral neuropathy and he has also been placed on Keflex by his private doctor, I listened to him with the Doppler and he has got good pulses, so he will come back in 1 week and we will see how he does with the Lasix and elevation and perhaps we can wrap him on his next visit.     Ardath Sax, M.D.     PP/MEDQ  D:  02/09/2011  T:  02/09/2011  Job:  [id number]  Electronically Signed by Ardath Sax  on 04/13/2011 03:08:36 PM

## 2012-07-29 ENCOUNTER — Encounter: Payer: Self-pay | Admitting: Family Medicine

## 2012-07-29 DIAGNOSIS — E559 Vitamin D deficiency, unspecified: Secondary | ICD-10-CM | POA: Insufficient documentation

## 2012-08-09 ENCOUNTER — Telehealth: Payer: Self-pay | Admitting: Family Medicine

## 2012-08-09 DIAGNOSIS — I10 Essential (primary) hypertension: Secondary | ICD-10-CM

## 2012-08-09 DIAGNOSIS — E119 Type 2 diabetes mellitus without complications: Secondary | ICD-10-CM

## 2012-08-09 MED ORDER — AMLODIPINE BESYLATE 10 MG PO TABS: 10.0000 mg | ORAL_TABLET | Freq: Every day | ORAL | Status: DC

## 2012-08-09 MED ORDER — GLIPIZIDE ER 10 MG PO TB24: 10.0000 mg | ORAL_TABLET | Freq: Every day | ORAL | Status: DC

## 2012-08-09 MED ORDER — NEBIVOLOL HCL 5 MG PO TABS: 5.0000 mg | ORAL_TABLET | Freq: Every day | ORAL | Status: DC

## 2012-08-09 NOTE — Telephone Encounter (Signed)
Medication refilled per protocol. 

## 2012-10-18 ENCOUNTER — Encounter: Payer: Self-pay | Admitting: Physician Assistant

## 2012-10-27 ENCOUNTER — Ambulatory Visit (INDEPENDENT_AMBULATORY_CARE_PROVIDER_SITE_OTHER): Payer: Medicare PPO | Admitting: Physician Assistant

## 2012-10-27 ENCOUNTER — Encounter: Payer: Self-pay | Admitting: Physician Assistant

## 2012-10-27 VITALS — BP 140/94 | HR 68 | Temp 98.0°F | Resp 18 | Ht 69.0 in | Wt 226.0 lb

## 2012-10-27 DIAGNOSIS — C61 Malignant neoplasm of prostate: Secondary | ICD-10-CM

## 2012-10-27 DIAGNOSIS — E1159 Type 2 diabetes mellitus with other circulatory complications: Secondary | ICD-10-CM

## 2012-10-27 DIAGNOSIS — N184 Chronic kidney disease, stage 4 (severe): Secondary | ICD-10-CM

## 2012-10-27 DIAGNOSIS — I10 Essential (primary) hypertension: Secondary | ICD-10-CM

## 2012-10-27 DIAGNOSIS — E559 Vitamin D deficiency, unspecified: Secondary | ICD-10-CM

## 2012-10-27 LAB — COMPLETE METABOLIC PANEL WITH GFR
ALT: 9 U/L (ref 0–53)
AST: 13 U/L (ref 0–37)
Albumin: 3.9 g/dL (ref 3.5–5.2)
BUN: 36 mg/dL — ABNORMAL HIGH (ref 6–23)
CO2: 23 mEq/L (ref 19–32)
Calcium: 9.2 mg/dL (ref 8.4–10.5)
Chloride: 109 mEq/L (ref 96–112)
Creat: 2.14 mg/dL — ABNORMAL HIGH (ref 0.50–1.35)
GFR, Est African American: 35 mL/min — ABNORMAL LOW
Potassium: 6.4 mEq/L (ref 3.5–5.3)

## 2012-10-27 LAB — LIPID PANEL
HDL: 34 mg/dL — ABNORMAL LOW (ref >39)
LDL Cholesterol: 70 mg/dL (ref 0–99)
Triglycerides: 271 mg/dL — ABNORMAL HIGH (ref <150)
VLDL: 54 mg/dL — ABNORMAL HIGH (ref 0–40)

## 2012-10-27 LAB — HEMOGLOBIN A1C: Hgb A1c MFr Bld: 6.7 % — ABNORMAL HIGH (ref <5.7)

## 2012-10-27 MED ORDER — NEBIVOLOL HCL 10 MG PO TABS: 10.0000 mg | ORAL_TABLET | Freq: Every day | ORAL | Status: DC

## 2012-10-27 NOTE — Progress Notes (Signed)
Patient ID: Phillip Herring MRN: [id number], DOB: 1941-03-11, 72 y.o. Date of Encounter: @DATE @  Chief Complaint:  Chief Complaint  Patient presents with  . 3 mth check up    HPI: 72 y.o. year old white male  presents for routine f/u OV. He says he has been feeling good. Has no complaints today. At LOV his mother had recently passed away so he had busy taking care of her etc. Today he says everything has settled back down to "normal." Has had no chest pressure, heaviness, tightness, or SOB even with exertion.   1-DM: Checks BS at different times of day on different days. Did not bring BS log with him. Says BS has been good. Fasting a.m. 90s. 120-130 at other times of day.   He DID add Actos after last labs-as directed.   He is very compliant with diet. Says he "can tell what foods to limit-eats only part of a apple b/c knows if eats whole apple sugar will go up."   2-HTN: Taking meds as directed. No adv effects.   Past Medical History  Diagnosis Date  . NIDDM (non-insulin dependent diabetes mellitus)   . Hypertension   . Undescended testicle   . CKD (chronic kidney disease)   . Vitamin D deficiency   . Prostate cancer      Home Meds: See attached medication section for current medication list. Any medications entered into computer today will not appear on this note's list. The medications listed below were entered prior to today. Current Outpatient Prescriptions on File Prior to Visit  Medication Sig Dispense Refill  . amLODipine (NORVASC) 10 MG tablet Take 1 tablet (10 mg total) by mouth daily.  30 tablet  5  . aspirin 81 MG tablet Take 81 mg by mouth daily.        . finasteride (PROSCAR) 5 MG tablet Take 5 mg by mouth daily.        Marland Kitchen glipiZIDE (GLUCOTROL XL) 10 MG 24 hr tablet Take 1 tablet (10 mg total) by mouth daily.  30 tablet  5  . pioglitazone (ACTOS) 15 MG tablet Take 15 mg by mouth daily.      . ergocalciferol (VITAMIN D2) 50000 UNITS capsule Take 50,000 Units by mouth  once a week.       No current facility-administered medications on file prior to visit.    Allergies:  Allergies  Allergen Reactions  . Enalapril     Hyperkalemia.  . Metformin And Related Other (See Comments)    Renal insufficiency   . Tekturna (Aliskiren)     hyperkalemia    History   Social History  . Marital Status: Single    Spouse Name: N/A    Number of Children: N/A  . Years of Education: N/A   Occupational History  . Not on file.   Social History Main Topics  . Smoking status: Former Smoker    Quit date: 10/28/1982  . Smokeless tobacco: Never Used  . Alcohol Use: No  . Drug Use: No  . Sexually Active: Not on file   Other Topics Concern  . Not on file   Social History Narrative  . No narrative on file    History reviewed. No pertinent family history.   Review of Systems:  See HPI for pertinent ROS. All other ROS negative.    Physical Exam: Blood pressure 140/94, pulse 68, temperature 98 F (36.7 C), temperature source Oral, resp. rate 18, height 5\' 9"  (1.753 m), weight 226  lb (102.513 kg)., Body mass index is 33.36 kg/(m^2). General:WNWD WM. Appears in no acute distress. Neck: Supple. No thyromegaly. No lymphadenopathy. No carotid bruits. Lungs: Clear bilaterally to auscultation without wheezes, rales, or rhonchi. Breathing is unlabored. Heart: RRR with S1 S2. No murmurs, rubs, or gallops. Abdomen: Soft, non-tender, non-distended with normoactive bowel sounds. No hepatomegaly. No rebound/guarding. No obvious abdominal masses. Musculoskeletal:  Strength and tone normal for age. Extremities/Skin: Warm and dry. No clubbing or cyanosis. No edema. No rashes or suspicious lesions. Neuro: Alert and oriented X 3. Moves all extremities spontaneously. Gait is normal. CNII-XII grossly in tact. Psych:  Responds to questions appropriately with a normal affect. DIABETIC FOOT EXAM;SEE ATTACHED SECTION     ASSESSMENT AND PLAN:  72 y.o. year old male with  1.  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 No ACE Inh or ARB sec to hyperkalemia  2. Hypertension BP suboptimal. It has been borderline high at recent OVs as well. Will increase Bystolic to 10mg  and cont all other meds same.  Avoid ACE Inh and ARB sec to hyperkalemia.  - nebivolol (BYSTOLIC) 10 MG tablet; Take 1 tablet (10 mg total) by mouth daily.  Dispense: 30 tablet; Refill: 11 - COMPLETE METABOLIC PANEL WITH GFR  3. Type II or unspecified type diabetes mellitus with peripheral circulatory disorders, uncontrolled(250.72) - COMPLETE METABOLIC PANEL WITH GFR - Hemoglobin A1c Last MicroAlbumin 06/2012 very elevated.  He still sees Dr.Sanders Environmental consultant) routinely-has f/u appt scheduled.  He reports that he has some tingling in toes when he first wakes up but says this goes away once he gets up and starts walking around. I discussed that there are meds that will treat this. He defers now. "Not that bad."   4. H/O Right Foot "Pirgoff" Amputation.  5. H/O Wounds Left foot 5 /2012--LE Arterial Dopplers 09/2010 showed "no evidence of arterial insufficiency"   6. H/O Favorable FLP 09/2010. Recheck now.   7. Prostate cancer Sees Urology Q 6 months still per pt  8. Vitamin D deficiency      06/2012 Rx Vit D-pt says he has been taking this as directed. Will recheck level then tell him what dose to take now.  - Vitamin D 25 hydroxy  9. Screening Colonoscopy 01/2010-Repeat 5 years sec to poor prep 10. Immunizations:   Pneumovax: 2010  Tetanus: 2010  Zostavax: Discussed 07/22/12. He was to check with his insurance reg cost. He reports that he forgot. Will f/u again at next ov.   [address] Rafael Gonzalez, [place], [facility] 10/27/2012 1:52 PM

## 2012-10-28 ENCOUNTER — Ambulatory Visit: Payer: Self-pay | Admitting: Physician Assistant

## 2012-10-28 ENCOUNTER — Telehealth: Payer: Self-pay | Admitting: Family Medicine

## 2012-10-28 DIAGNOSIS — E875 Hyperkalemia: Secondary | ICD-10-CM

## 2012-10-28 MED ORDER — SODIUM POLYSTYRENE SULFONATE PO POWD: Freq: Once | ORAL | Status: DC

## 2012-10-28 NOTE — Telephone Encounter (Signed)
Trying to reach pt.  Home phone No Answer. Left mess at emergency contact number

## 2012-10-28 NOTE — Telephone Encounter (Signed)
Message copied by Donne Anon on Fri Oct 28, 2012  9:14 AM ------      Message from: Lynnea Ferrier      Created: Fri Oct 28, 2012  7:20 AM       In case MBD does not see, start Kayexalate 15 g pox1 and recheck potassium Monday.  His potassium is too high.  Also increase water consumption. ------

## 2012-10-28 NOTE — Telephone Encounter (Signed)
Pt came to office.  Informed of high potassium.  Avoid high potassium foods and take kayexalate as ordered.  Return Monday for repeat labs.  Pt acknowledged understanding

## 2012-10-31 ENCOUNTER — Other Ambulatory Visit: Payer: Medicare PPO

## 2012-11-01 ENCOUNTER — Other Ambulatory Visit (INDEPENDENT_AMBULATORY_CARE_PROVIDER_SITE_OTHER): Payer: Medicare PPO

## 2012-11-01 DIAGNOSIS — E875 Hyperkalemia: Secondary | ICD-10-CM

## 2012-11-01 LAB — BASIC METABOLIC PANEL
Calcium: 8.8 mg/dL (ref 8.4–10.5)
Creat: 2.09 mg/dL — ABNORMAL HIGH (ref 0.50–1.35)
Sodium: 142 mEq/L (ref 135–145)

## 2012-11-04 ENCOUNTER — Other Ambulatory Visit: Payer: Self-pay | Admitting: Nephrology

## 2012-11-04 NOTE — Progress Notes (Signed)
Mailbox has not been set up yet06/03/14/ss

## 2012-11-07 ENCOUNTER — Ambulatory Visit
Admission: RE | Admit: 2012-11-07 | Discharge: 2012-11-07 | Disposition: A | Discharge status: Home / Self Care | Payer: Medicare PPO | Source: Ambulatory Visit | Attending: Nephrology | Admitting: Nephrology

## 2012-11-07 ENCOUNTER — Encounter: Payer: Self-pay | Admitting: Family Medicine

## 2012-11-08 ENCOUNTER — Telehealth: Payer: Self-pay | Admitting: Family Medicine

## 2012-11-08 NOTE — Telephone Encounter (Signed)
Message copied by Donne Anon on Tue Nov 08, 2012  8:47 AM ------      Message from: Lynnea Ferrier      Created: Tue Nov 08, 2012  7:30 AM       There are no blockages or masses on his kidneys.  Follow up with nephrology as planned.  Prostate gland is enlarged on [place].  Follow up as planned with urology. ------

## 2012-11-08 NOTE — Telephone Encounter (Signed)
Pt was called.  Told about renal studies.  States has seen nephrologist on June 13th.  Has renal ultrasounds done this week.  Has follow up appt with them.  Also has had labs done at Urology and provider appt pending

## 2012-11-17 ENCOUNTER — Other Ambulatory Visit (INDEPENDENT_AMBULATORY_CARE_PROVIDER_SITE_OTHER): Payer: Medicare PPO

## 2012-11-17 DIAGNOSIS — E875 Hyperkalemia: Secondary | ICD-10-CM

## 2012-11-17 LAB — BASIC METABOLIC PANEL
CO2: 24 mEq/L (ref 19–32)
Chloride: 111 mEq/L (ref 96–112)
Potassium: 5.6 mEq/L — ABNORMAL HIGH (ref 3.5–5.3)
Sodium: 142 mEq/L (ref 135–145)

## 2012-11-18 ENCOUNTER — Telehealth: Payer: Self-pay | Admitting: Family Medicine

## 2012-11-18 NOTE — Telephone Encounter (Signed)
Pt called.  He has seen Nephrology.  Has follow up appt for beginning of August.  Is taking 40 mg furosemide daily

## 2012-11-18 NOTE — Telephone Encounter (Signed)
Message copied by Donne Anon on Fri Nov 18, 2012 12:38 PM ------      Message from: Allayne Butcher      Created: Thu Nov 17, 2012  5:54 PM       I looked in Epic ([facility] not on Epic apparently)-No notes from Renal in Epic. I looked in paper chart-no notes by renal in paper chart. Has pt seen kidney doctor? If so, when? And when is next appt there? Actually, at some point, I did see somehting from Renal b/c I think I remember them starting some furosemide. Find out what dose of furosemide he is on in addition to above information also. ------

## 2012-12-23 DIAGNOSIS — E875 Hyperkalemia: Secondary | ICD-10-CM

## 2012-12-23 HISTORY — DX: Hyperkalemia: E87.5

## 2013-02-01 ENCOUNTER — Encounter: Payer: Self-pay | Admitting: Physician Assistant

## 2013-02-01 ENCOUNTER — Ambulatory Visit (INDEPENDENT_AMBULATORY_CARE_PROVIDER_SITE_OTHER): Payer: Medicare PPO | Admitting: Physician Assistant

## 2013-02-01 ENCOUNTER — Telehealth: Payer: Self-pay | Admitting: Family Medicine

## 2013-02-01 VITALS — BP 138/84 | HR 76 | Temp 98.5°F | Resp 18 | Wt 223.0 lb

## 2013-02-01 DIAGNOSIS — I10 Essential (primary) hypertension: Secondary | ICD-10-CM

## 2013-02-01 DIAGNOSIS — Q539 Undescended testicle, unspecified: Secondary | ICD-10-CM

## 2013-02-01 DIAGNOSIS — C61 Malignant neoplasm of prostate: Secondary | ICD-10-CM

## 2013-02-01 DIAGNOSIS — E1159 Type 2 diabetes mellitus with other circulatory complications: Secondary | ICD-10-CM

## 2013-02-01 DIAGNOSIS — E559 Vitamin D deficiency, unspecified: Secondary | ICD-10-CM

## 2013-02-01 DIAGNOSIS — N189 Chronic kidney disease, unspecified: Secondary | ICD-10-CM

## 2013-02-01 MED ORDER — PIOGLITAZONE HCL 45 MG PO TABS: 45.0000 mg | ORAL_TABLET | Freq: Every day | ORAL | Status: DC

## 2013-02-01 NOTE — Progress Notes (Signed)
Patient ID: Phillip Herring MRN: [id number], DOB: Nov 27, 1940, 72 y.o. Date of Encounter: @DATE @  Chief Complaint:  Chief Complaint  Patient presents with  . 3 month check up    wants to ask about Phillip Herring note    HPI: 72 y.o. year old male  presents for routine followup office visit. He says he's been feeling good. He has no complaints today.  When he does some physical exertion he has no chest pressure heaviness tightness or shortness of breath/dyspnea on exertion.  He has been seeing Dr. Eliott Herring regarding his severe kidney disease. He says his last office visit with her was on 01/16/2013. As well she has been checking labs and adjusting medications. She has been sending me her notes and lab results and a should be scanned in. She has been adjusting his Lasix dose his Kayexalate and his sodium bicarbonate. He is taking these as directed. As well he says he has made diet changes as she had recommended. I do recall that her night mentioned giving him a list of foods containing high potassium to avoid. He is very compliant. He says his last visit with her was 01/16/13 and that he was told to followup with her in 2 months for followup visit. He has had labs in the interim.  Diabetes: He checks his blood sugar at different times on different days. He did not bring in the blood sugar log with him. However he does report that the blood sugars are continued to be good. Fasting morning readings are in the 90s. He is still getting 120 to 1:30 at other times of the day. He is taking all medications as directed. He is very compliant with his diet  Hypertension: He is taking medicines as directed. I reviewed that at the last office visit with me on 10/27/12 his blood pressure was suboptimal and we increased nonsolid to 10 mg. We reviewed this is specifically today and he did increase this dose and is taking as directed he is having no adverse effects and no lightheadedness and no lower extremity edema.  Lipids: These  have been good in the past. I did repeat these at his last visit in June and they were still excellent with LDL of 70 despite being on no cholesterol meds.  Vitamin D deficiency: February 2014 his level was low and I gave him prescription vitamin D to take weekly for 3 months. He says he completed that prescription he has been taking a vitamin D.   Past Medical History  Diagnosis Date  . NIDDM (non-insulin dependent diabetes mellitus)   . Hypertension   . Undescended testicle   . CKD (chronic kidney disease)   . Vitamin D deficiency   . Prostate cancer      Home Meds: See attached medication section for current medication list. Any medications entered into computer today will not appear on this note's list. The medications listed below were entered prior to today. Current Outpatient Prescriptions on File Prior to Visit  Medication Sig Dispense Refill  . amLODipine (NORVASC) 10 MG tablet Take 1 tablet (10 mg total) by mouth daily.  30 tablet  5  . aspirin 81 MG tablet Take 81 mg by mouth daily.        . finasteride (PROSCAR) 5 MG tablet Take 5 mg by mouth daily.        Marland Kitchen glipiZIDE (GLUCOTROL XL) 10 MG 24 hr tablet Take 1 tablet (10 mg total) by mouth daily.  30 tablet  5  .  nebivolol (BYSTOLIC) 10 MG tablet Take 1 tablet (10 mg total) by mouth daily.  30 tablet  11  . pioglitazone (ACTOS) 15 MG tablet Take 15 mg by mouth daily.       No current facility-administered medications on file prior to visit.    Allergies:  Allergies  Allergen Reactions  . Enalapril     Hyperkalemia.  . Metformin And Related Other (See Comments)    Renal insufficiency   . Tekturna [Aliskiren]     hyperkalemia    History   Social History  . Marital Status: Single    Spouse Name: N/A    Number of Children: N/A  . Years of Education: N/A   Occupational History  . Not on file.   Social History Main Topics  . Smoking status: Former Smoker    Quit date: 10/28/1982  . Smokeless tobacco: Never  Used  . Alcohol Use: No  . Drug Use: No  . Sexual Activity: Not on file   Other Topics Concern  . Not on file   Social History Narrative  . No narrative on file    History reviewed. No pertinent family history.   Review of Systems:  See HPI for pertinent ROS. All other ROS negative.    Physical Exam: Blood pressure 138/84, pulse 76, temperature 98.5 F (36.9 C), temperature source Oral, resp. rate 18, weight 223 lb (101.152 kg)., Body mass index is 32.92 kg/(m^2). General: Well-nourished well-developed white male very pleasant .Appears in no acute distress.  Neck: Supple. No thyromegaly. No lymphadenopathy. No carotid bruits. Lungs: Clear bilaterally to auscultation without wheezes, rales, or rhonchi. Breathing is unlabored. Heart: RRR with S1 S2. No murmurs, rubs, or gallops. Abdomen: Soft, non-tender, non-distended with normoactive bowel sounds. No hepatomegaly. No rebound/guarding. No obvious abdominal masses. Musculoskeletal:  Strength and tone normal for age. Extremities/Skin: Warm and dry. No clubbing or cyanosis. No edema. No rashes or suspicious lesions. Neuro: Alert and oriented X 3. Moves all extremities spontaneously. Gait is normal. CNII-XII grossly in tact. Psych:  Responds to questions appropriately with a normal affect. Diabetic foot exam: The attached section. 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 Left foot has good sensation. There are no lesions or calluses on problem areas. His posterior tibial pulse is 2+ and bounding. However I cannot palpate a dorsalis pedis pulse.      ASSESSMENT AND PLAN:  72 y.o. year old male with  1. Type II or unspecified type diabetes mellitus with peripheral circulatory disorders, uncontrolled(250.72) - Hemoglobin A1C, fingerstick  Diabetic nephropathy: Dr. Eliott Herring is managing this. He is taking his Lasix Kayexalate and sodium bicarbonate as she directed. She has discussed with him upcoming dialysis in the future. Diabetic retinopathy  evaluation: He [facility]ne [facility]. Diabetic neuropathy: He does report that the bottom of his left foot feels somewhat numb and tingly when he first stands up. However he says this improves when seen his around. I have discussed with him that there is medication to help with this but he continues to defer this again today.  No metformin secondary to elevated creatinine/chronic kidney disease No ACE inhibitor or ARB secondary to hyperkalemia  2. CKD (chronic kidney disease) Per Dr. Eliott Herring. She is managing this. He is on Lasix Kayexalate and sodium bicarbonate and she has discussed possible future dialysis with him.  3. Hypertension At goal. He did increase his [facility] as directed at the last office visit.  4. Vitamin D deficiency He was  given prescription vitamin D after I reviewed his labs in February 2014. He did take his prescription weekly for the 3 months. However when he completed that prescription he has been on and vitamin D. On lab 10/28/2012 vitamin D was 33. I will have him start taking over-the-counter vitamin D 1000 units daily.  5. Prostate cancer     #5 and #60 per urology 6. Undescended testicle  #7 lipids: Surprisingly his lipid profile has always been excellent. Doubt medication. I repeated is to followup again on 10/28/12. Total cholesterol 158 triglyceride 271 HDL 34 LDL 70.  #8 history of right foot g amputation  #9 history of wounds to the left foot 09/2010. Lower surety arterial Dopplers 09/2010 showed no evidence of arterial insufficiency.  #10 screening colonoscopy: Done 01/2010. Repeat 5 years secondary to poor prep  #11 immunizations: Pneumovax: 2010 Tetanus: 2010 Zostavax I discussed this on 07/22/2012. He was to check with his insurance regarding cost. He has forgotten to check on this. We'll need to follow up with [place] in the future.  Diabetic foot exam, fall risk screen, depression screen were all  entered.   Murray Hodgkins [place], [place], [facility] 02/01/2013 8:43 AM

## 2013-02-01 NOTE — Telephone Encounter (Signed)
Message copied by Donne Anon on Wed Feb 01, 2013  3:54 PM ------      Message from: Allayne Butcher      Created: Wed Feb 01, 2013  2:07 PM       A1C has gotten higher.       Last check 10/27/12 A1C was 6.7      Now A1C is 7.7            Increase Actos to 45mg  one po QD  # 30 / 5      (currnetly on 15 mg--if has a lot of these in current bottle, cna take 3 a day then take one a day whne starts this new Rx bottle            Cont all other meds the same. ------

## 2013-02-01 NOTE — Telephone Encounter (Signed)
Pt aware of elevated A1C.  To start taking Actos 45 mg daily. (If still has 15 mg at home can take three a day to use them up)  New Rx to pharmacy.  Needs to be seen again 3 months

## 2013-02-13 ENCOUNTER — Encounter: Payer: Self-pay | Admitting: Physician Assistant

## 2013-02-13 DIAGNOSIS — E875 Hyperkalemia: Secondary | ICD-10-CM | POA: Insufficient documentation

## 2013-03-27 ENCOUNTER — Other Ambulatory Visit: Payer: Self-pay | Admitting: *Deleted

## 2013-03-27 DIAGNOSIS — N184 Chronic kidney disease, stage 4 (severe): Secondary | ICD-10-CM

## 2013-03-27 DIAGNOSIS — Z01810 Encounter for preprocedural cardiovascular examination: Secondary | ICD-10-CM

## 2013-04-10 ENCOUNTER — Encounter: Payer: Self-pay | Admitting: Vascular Surgery

## 2013-04-11 ENCOUNTER — Other Ambulatory Visit: Payer: Self-pay

## 2013-04-11 ENCOUNTER — Ambulatory Visit (INDEPENDENT_AMBULATORY_CARE_PROVIDER_SITE_OTHER): Payer: Medicare PPO | Admitting: Vascular Surgery

## 2013-04-11 ENCOUNTER — Encounter (INDEPENDENT_AMBULATORY_CARE_PROVIDER_SITE_OTHER): Payer: Self-pay

## 2013-04-11 ENCOUNTER — Encounter: Payer: Self-pay | Admitting: Vascular Surgery

## 2013-04-11 ENCOUNTER — Ambulatory Visit (HOSPITAL_COMMUNITY)
Admission: RE | Admit: 2013-04-11 | Discharge: 2013-04-11 | Disposition: A | Discharge status: Home / Self Care | Payer: Medicare PPO | Source: Ambulatory Visit | Attending: Vascular Surgery | Admitting: Vascular Surgery

## 2013-04-11 ENCOUNTER — Ambulatory Visit (INDEPENDENT_AMBULATORY_CARE_PROVIDER_SITE_OTHER)
Admission: RE | Admit: 2013-04-11 | Discharge: 2013-04-11 | Disposition: A | Discharge status: Home / Self Care | Payer: Medicare PPO | Source: Ambulatory Visit | Attending: Vascular Surgery | Admitting: Vascular Surgery

## 2013-04-11 VITALS — BP 142/85 | HR 76 | Ht 69.0 in | Wt 222.4 lb

## 2013-04-11 DIAGNOSIS — Z01810 Encounter for preprocedural cardiovascular examination: Secondary | ICD-10-CM | POA: Insufficient documentation

## 2013-04-11 DIAGNOSIS — N184 Chronic kidney disease, stage 4 (severe): Secondary | ICD-10-CM

## 2013-04-11 DIAGNOSIS — N186 End stage renal disease: Secondary | ICD-10-CM

## 2013-04-11 NOTE — Progress Notes (Signed)
Subjective:     Patient ID: Phillip Herring, male   DOB: 01-26-1941, 72 y.o.   MRN: [id number]  HPI this 72 year old male was referred by Dr. Camille Bal for evaluation for vascular access. The patient is right-handed. He has chronic renal insufficiency but has never been on hemodialysis. He does have a history of hypertension and type 2 diabetes mellitus. He denies any pain or numbness in either upper extremity.  Past Medical History  Diagnosis Date  . NIDDM (non-insulin dependent diabetes mellitus)   . Hypertension   . Undescended testicle   . CKD (chronic kidney disease)   . Vitamin D deficiency   . Prostate cancer   . Chronic hyperkalemia 12/23/2012    Sees Renal    History  Substance Use Topics  . Smoking status: Former Smoker    Quit date: 10/28/1982  . Smokeless tobacco: Never Used  . Alcohol Use: No    Family History  Problem Relation Age of Onset  . Cancer Sister     Allergies  Allergen Reactions  . Enalapril     Hyperkalemia.  . Metformin And Related Other (See Comments)    Renal insufficiency   . Tekturna [Aliskiren]     hyperkalemia    Current outpatient prescriptions:amLODipine (NORVASC) 10 MG tablet, Take 1 tablet (10 mg total) by mouth daily., Disp: 30 tablet, Rfl: 5;  aspirin 81 MG tablet, Take 81 mg by mouth daily.  , Disp: , Rfl: ;  cholecalciferol (VITAMIN D) 1000 UNITS tablet, Take 1,000 Units by mouth daily., Disp: , Rfl: ;  finasteride (PROSCAR) 5 MG tablet, Take 5 mg by mouth daily.  , Disp: , Rfl:  furosemide (LASIX) 80 MG tablet, Take 1 tablet by mouth daily., Disp: , Rfl: ;  glipiZIDE (GLUCOTROL XL) 10 MG 24 hr tablet, Take 1 tablet (10 mg total) by mouth daily., Disp: 30 tablet, Rfl: 5;  nebivolol (BYSTOLIC) 10 MG tablet, Take 1 tablet (10 mg total) by mouth daily., Disp: 30 tablet, Rfl: 11;  pioglitazone (ACTOS) 45 MG tablet, Take 1 tablet (45 mg total) by mouth daily., Disp: 30 tablet, Rfl: 5 sodium bicarbonate 650 MG tablet, Take 1 tablet by mouth 3  (three) times daily., Disp: , Rfl: ;  sodium polystyrene (KAYEXALATE) powder, Take 15 g by mouth once. Take 15 grams by mouth 2 times per week--on Mondays and Fridays, Disp: , Rfl:   BP 142/85  Pulse 76  Ht 5\' 9"  (1.753 m)  Wt 222 lb 6.4 oz (100.88 kg)  BMI 32.83 kg/m2  SpO2 97%  Body mass index is 32.83 kg/(m^2).         Review of Systems patient denies chest pain, dyspnea on exertion, PND, orthopnea, hemoptysis. Patient does complain of bilateral lower extremity edema. All other systems negative and a complete review of systems    Objective:   Physical Exam BP 142/85  Pulse 76  Ht 5\' 9"  (1.753 m)  Wt 222 lb 6.4 oz (100.88 kg)  BMI 32.83 kg/m2  SpO2 97%  Gen.-alert and oriented x3 in no apparent distress HEENT normal for age Lungs no rhonchi or wheezing Cardiovascular regular rhythm no murmurs carotid pulses 3+ palpable no bruits audible Abdomen soft nontender no palpable masses Musculoskeletal free of  major deformities Skin clear -no rashes Neurologic normal Lower extremities 3+ femoral and dorsalis pedis pulses palpable bilaterally with no edema Bilateral cephalic veins appear adequate on physical exam for AV fistula creation  Today I ordered bilateral upper extremity vein mapping which  are reviewed and interpreted. Cephalic veins appear adequate for fistula creation bilaterally.        Assessment:     Chronic renal insufficiency-stage IV needs vascular access    Plan:     Plan creation left radial cephalic AV fistula on Monday, November 24 by Dr. early Risks and benefits thoroughly discussed with patient including lack of maturation of fistula

## 2013-04-13 ENCOUNTER — Encounter (HOSPITAL_COMMUNITY)
Admission: RE | Admit: 2013-04-13 | Discharge: 2013-04-13 | Disposition: A | Discharge status: Home / Self Care | Payer: Medicare PPO | Source: Ambulatory Visit | Attending: Vascular Surgery | Admitting: Vascular Surgery

## 2013-04-13 ENCOUNTER — Encounter (HOSPITAL_COMMUNITY): Payer: Self-pay

## 2013-04-13 ENCOUNTER — Other Ambulatory Visit (HOSPITAL_COMMUNITY): Payer: Medicare PPO

## 2013-04-13 ENCOUNTER — Encounter (HOSPITAL_COMMUNITY)
Admission: RE | Admit: 2013-04-13 | Discharge: 2013-04-13 | Disposition: A | Discharge status: Home / Self Care | Payer: Medicare PPO | Source: Ambulatory Visit | Attending: Anesthesiology | Admitting: Anesthesiology

## 2013-04-13 DIAGNOSIS — Z01810 Encounter for preprocedural cardiovascular examination: Secondary | ICD-10-CM | POA: Insufficient documentation

## 2013-04-13 DIAGNOSIS — Z01818 Encounter for other preprocedural examination: Secondary | ICD-10-CM | POA: Insufficient documentation

## 2013-04-13 DIAGNOSIS — Z01812 Encounter for preprocedural laboratory examination: Secondary | ICD-10-CM | POA: Insufficient documentation

## 2013-04-13 NOTE — Pre-Procedure Instructions (Signed)
Phillip Herring  04/13/2013   Your procedure is scheduled on:  Monday November 24 th at 0924 AM  Report to [facility] Main Entrance "A" at [phone] AM.  Call this number if you have problems the morning of surgery: [phone]   Remember:   Do not eat food or drink liquids after midnight.   Take these medicines the morning of surgery with A SIP OF WATER: Amlodipine(Norvasc), and Nebivolol(Bystolic)   Do not wear jewelry.  Do not wear lotions, powders, or perfumes. You may wear deodorant.  Do not shave 48 hours prior to surgery. Men may shave face and neck.  Do not bring valuables to the hospital.  [facility] is not responsible for any belongings or valuables.               Contacts, dentures or bridgework may not be worn into surgery.  Leave suitcase in the car. After surgery it may be brought to your room.  For patients admitted to the hospital, discharge time is determined by your  treatment team.               Patients discharged the day of surgery will not be allowed to drive home.  Name and phone number of your driver:   Special Instructions: Shower using CHG 2 nights before surgery and the night before surgery.  If you shower the day of surgery use CHG.  Use special wash - you have one bottle of CHG for all showers.  You should use approximately 1/3 of the bottle for each shower.   Please read over the following fact sheets that you were given: Pain Booklet, Coughing and Deep Breathing and Surgical Site Infection Prevention

## 2013-04-14 NOTE — Progress Notes (Signed)
Anesthesia Chart Review:  Patient is a 72 year old male scheduled for creation of left radiocephalic AVF on 04/17/13 by Dr. Arbie Cookey.  History includes CKD not yet on hemodialysis, DM2, HTN, prostate cancer, undescended testicle, former smoker, right foot amputation '10.  Nephrologist is Dr. Camille Bal. PCP is Allayne Butcher, PA-C.  EKG on 04/13/13 showed NSR, PAC's, cannot rule out anterior infarct (age undetermined).  Consider V2-3 lead reversal.  No CV symptoms documented at PAT or H&P visit.  CXR on 04/13/13 showed: No active cardiopulmonary disease. Linear scarring or atelectasis at the left lung base.  He will get an ISTAT on arrival. A1C on 10/27/12 was 6.7.  If labs are acceptable and no new CV symptoms then I would anticipate that he could proceed as planned.  Velna Ochs [facility] Short Stay Center/Anesthesiology Phone [phone] 04/14/2013 10:19 AM

## 2013-04-16 MED ORDER — DEXTROSE 5 % IV SOLN
1.5000 g | INTRAVENOUS | Status: AC
  Administered 2013-04-17: 1.5 g via INTRAVENOUS
  Filled 2013-04-16: qty 1.5

## 2013-04-17 ENCOUNTER — Ambulatory Visit (HOSPITAL_COMMUNITY): Payer: Medicare PPO | Admitting: Anesthesiology

## 2013-04-17 ENCOUNTER — Encounter (HOSPITAL_COMMUNITY)
Admission: RE | Disposition: A | Discharge status: Home / Self Care | Payer: Self-pay | Source: Ambulatory Visit | Attending: Vascular Surgery

## 2013-04-17 ENCOUNTER — Encounter (HOSPITAL_COMMUNITY): Payer: Medicare PPO | Admitting: Vascular Surgery

## 2013-04-17 ENCOUNTER — Telehealth: Payer: Self-pay | Admitting: Vascular Surgery

## 2013-04-17 ENCOUNTER — Encounter (HOSPITAL_COMMUNITY): Payer: Self-pay | Admitting: Anesthesiology

## 2013-04-17 ENCOUNTER — Ambulatory Visit (HOSPITAL_COMMUNITY)
Admission: RE | Admit: 2013-04-17 | Discharge: 2013-04-17 | Disposition: A | Discharge status: Home / Self Care | Payer: Medicare PPO | Source: Ambulatory Visit | Attending: Vascular Surgery | Admitting: Vascular Surgery

## 2013-04-17 DIAGNOSIS — N186 End stage renal disease: Secondary | ICD-10-CM

## 2013-04-17 DIAGNOSIS — E119 Type 2 diabetes mellitus without complications: Secondary | ICD-10-CM | POA: Insufficient documentation

## 2013-04-17 DIAGNOSIS — Z87891 Personal history of nicotine dependence: Secondary | ICD-10-CM | POA: Insufficient documentation

## 2013-04-17 DIAGNOSIS — N184 Chronic kidney disease, stage 4 (severe): Secondary | ICD-10-CM | POA: Insufficient documentation

## 2013-04-17 DIAGNOSIS — I129 Hypertensive chronic kidney disease with stage 1 through stage 4 chronic kidney disease, or unspecified chronic kidney disease: Secondary | ICD-10-CM | POA: Insufficient documentation

## 2013-04-17 HISTORY — PX: AV FISTULA PLACEMENT: SHX1204

## 2013-04-17 LAB — GLUCOSE, CAPILLARY: Glucose-Capillary: 100 mg/dL — ABNORMAL HIGH (ref 70–99)

## 2013-04-17 LAB — POCT I-STAT 4, (NA,K, GLUC, HGB,HCT)
Glucose, Bld: 99 mg/dL (ref 70–99)
HCT: 36 % — ABNORMAL LOW (ref 39.0–52.0)
Hemoglobin: 12.2 g/dL — ABNORMAL LOW (ref 13.0–17.0)
Sodium: 143 mEq/L (ref 135–145)

## 2013-04-17 SURGERY — ARTERIOVENOUS (AV) FISTULA CREATION
Anesthesia: Monitor Anesthesia Care | Site: Arm Lower | Laterality: Left | Wound class: Clean

## 2013-04-17 MED ORDER — PROPOFOL INFUSION 10 MG/ML OPTIME
INTRAVENOUS | Status: DC | PRN
  Administered 2013-04-17: 75 ug/kg/min via INTRAVENOUS

## 2013-04-17 MED ORDER — FENTANYL CITRATE 0.05 MG/ML IJ SOLN
INTRAMUSCULAR | Status: DC | PRN
  Administered 2013-04-17 (×3): 25 ug via INTRAVENOUS

## 2013-04-17 MED ORDER — 0.9 % SODIUM CHLORIDE (POUR BTL) OPTIME
TOPICAL | Status: DC | PRN
  Administered 2013-04-17: 1000 mL

## 2013-04-17 MED ORDER — OXYCODONE HCL 5 MG PO TABS: 5.0000 mg | ORAL_TABLET | Freq: Four times a day (QID) | ORAL | Status: DC | PRN

## 2013-04-17 MED ORDER — MIDAZOLAM HCL 5 MG/5ML IJ SOLN
INTRAMUSCULAR | Status: DC | PRN
  Administered 2013-04-17: 2 mg via INTRAVENOUS

## 2013-04-17 MED ORDER — LIDOCAINE-EPINEPHRINE 0.5 %-1:200000 IJ SOLN
INTRAMUSCULAR | Status: DC | PRN
  Administered 2013-04-17: 5 mL via INTRADERMAL

## 2013-04-17 MED ORDER — LIDOCAINE-EPINEPHRINE 0.5 %-1:200000 IJ SOLN
INTRAMUSCULAR | Status: AC
  Filled 2013-04-17: qty 1

## 2013-04-17 MED ORDER — SODIUM CHLORIDE 0.9 % IV SOLN
INTRAVENOUS | Status: DC | PRN
  Administered 2013-04-17: 09:00:00 via INTRAVENOUS

## 2013-04-17 MED ORDER — SODIUM CHLORIDE 0.9 % IV SOLN
INTRAVENOUS | Status: DC
  Administered 2013-04-17: 08:00:00 via INTRAVENOUS

## 2013-04-17 MED ORDER — SODIUM CHLORIDE 0.9 % IR SOLN
Status: DC | PRN
  Administered 2013-04-17: 10:00:00

## 2013-04-17 SURGICAL SUPPLY — 33 items
ARMBAND PINK RESTRICT EXTREMIT (MISCELLANEOUS) ×2 IMPLANT
BENZOIN TINCTURE PRP APPL 2/3 (GAUZE/BANDAGES/DRESSINGS) ×2 IMPLANT
CANISTER SUCTION 2500CC (MISCELLANEOUS) ×2 IMPLANT
CLIP LIGATING EXTRA MED SLVR (CLIP) ×2 IMPLANT
CLIP LIGATING EXTRA SM BLUE (MISCELLANEOUS) ×2 IMPLANT
CLSR STERI-STRIP ANTIMIC 1/2X4 (GAUZE/BANDAGES/DRESSINGS) ×2 IMPLANT
COVER PROBE W GEL 5X96 (DRAPES) ×2 IMPLANT
COVER SURGICAL LIGHT HANDLE (MISCELLANEOUS) ×2 IMPLANT
DECANTER SPIKE VIAL GLASS SM (MISCELLANEOUS) ×2 IMPLANT
ELECT REM PT RETURN 9FT ADLT (ELECTROSURGICAL) ×2
ELECTRODE REM PT RTRN 9FT ADLT (ELECTROSURGICAL) ×1 IMPLANT
GEL ULTRASOUND 20GR AQUASONIC (MISCELLANEOUS) IMPLANT
GLOVE SS BIOGEL STRL SZ 7.5 (GLOVE) ×1 IMPLANT
GLOVE SUPERSENSE BIOGEL SZ 7.5 (GLOVE) ×1
GOWN STRL NON-REIN LRG LVL3 (GOWN DISPOSABLE) ×6 IMPLANT
KIT BASIN OR (CUSTOM PROCEDURE TRAY) ×2 IMPLANT
KIT ROOM TURNOVER OR (KITS) ×2 IMPLANT
NEEDLE HYPO 25GX1X1/2 BEV (NEEDLE) ×2 IMPLANT
NS IRRIG 1000ML POUR BTL (IV SOLUTION) ×2 IMPLANT
PACK CV ACCESS (CUSTOM PROCEDURE TRAY) ×2 IMPLANT
PAD ARMBOARD 7.5X6 YLW CONV (MISCELLANEOUS) ×4 IMPLANT
SPONGE GAUZE 4X4 12PLY (GAUZE/BANDAGES/DRESSINGS) ×2 IMPLANT
STRIP CLOSURE SKIN 1/2X4 (GAUZE/BANDAGES/DRESSINGS) ×2 IMPLANT
SUT PROLENE 6 0 CC (SUTURE) ×2 IMPLANT
SUT SILK 2 0 (SUTURE) ×1
SUT SILK 2-0 18XBRD TIE 12 (SUTURE) ×1 IMPLANT
SUT VIC AB 3-0 SH 27 (SUTURE) ×1
SUT VIC AB 3-0 SH 27X BRD (SUTURE) ×1 IMPLANT
TAPE CLOTH SURG 4X10 WHT LF (GAUZE/BANDAGES/DRESSINGS) ×2 IMPLANT
TOWEL OR 17X24 6PK STRL BLUE (TOWEL DISPOSABLE) ×2 IMPLANT
TOWEL OR 17X26 10 PK STRL BLUE (TOWEL DISPOSABLE) ×2 IMPLANT
UNDERPAD 30X30 INCONTINENT (UNDERPADS AND DIAPERS) ×2 IMPLANT
WATER STERILE IRR 1000ML POUR (IV SOLUTION) ×2 IMPLANT

## 2013-04-17 NOTE — Op Note (Signed)
    OPERATIVE REPORT  DATE OF SURGERY: 04/17/2013  PATIENT: Phillip Herring, 72 y.o. male MRN: [id number]  DOB: 03/21/1941  PRE-OPERATIVE DIAGNOSIS: Chronic renal insufficiency  POST-OPERATIVE DIAGNOSIS:  Same  PROCEDURE: Left radiocephalic Cimino fistula  SURGEON:  Gretta Began, M.D.  PHYSICIAN ASSISTANT: Rhyne  ANESTHESIA:  Local with sedation  EBL: Minimal ml  Total I/O In: 300 [I.V.:300] Out: -   BLOOD ADMINISTERED: None  DRAINS: None  SPECIMEN: None  COUNTS CORRECT:  YES  PLAN OF CARE: PACU   PATIENT DISPOSITION:  PACU - hemodynamically stable  PROCEDURE DETAILS: The patient was taken to the operating placed supine position where the area of the left arm straight AV sterile fashion. SonoSite ultrasound revealed good caliber cephalic vein at the wrist and forearm. Using local anesthesia incision made to the level of the radial artery and the cephalic vein. The cephalic vein was mobilized with stricture branches being ligated with 30 and 4-0 silk ties and divided. The vein was ligated distally and was gently dilated and was of good caliber. The vein was mobilized to the level of the radial artery. The radial artery was occluded proximal and distally was opened with an 11 blade and sent longitudinally with Potts scissors. The vein was cut to appropriate length and spatulated and sewn end-to-side to the artery with a running 6-0 Prolene suture. Clamps removed and excellent thrill was noted. Wound irrigated with saline. Hemostasis was obtained with cautery. Wounds were closed with 3-0 Vicryl in the subcutaneous and subcuticular tissue. Benzoin and Steri-Strips were applied.   Gretta Began, M.D. 04/17/2013 10:43 AM

## 2013-04-17 NOTE — Telephone Encounter (Addendum)
Message copied by Fredrich Birks on Mon Apr 17, 2013  2:22 PM ------      Message from: Melene Plan      Created: Mon Apr 17, 2013 12:02 PM       There are 2 pts on here and I could not delete Apple. Sorry      ----- Message -----         From: Larina Earthly, MD         Sent: 04/17/2013  10:45 AM           To: Reuel Derby, Melene Plan, RN            Left cephalic vein fistula. Risk manager. He does see him in one month.                ------  04/17/13: voicemail is not set up- mailed letter, dpm

## 2013-04-17 NOTE — Anesthesia Postprocedure Evaluation (Signed)
Anesthesia Post Note  Patient: Phillip Herring  Procedure(s) Performed: Procedure(s) (LRB): ARTERIOVENOUS (AV) FISTULA CREATION- LEFT RADIOCEPHALIC (Left)  Anesthesia type: general  Patient location: PACU  Post pain: Pain level controlled  Post assessment: Patient's Cardiovascular Status Stable  Last Vitals:  Filed Vitals:   04/17/13 1055  BP: 120/73  Pulse: 71  Temp:   Resp: 15    Post vital signs: Reviewed and stable  Level of consciousness: sedated  Complications: No apparent anesthesia complications

## 2013-04-17 NOTE — Transfer of Care (Signed)
Immediate Anesthesia Transfer of Care Note  Patient: Phillip Herring  Procedure(s) Performed: Procedure(s): ARTERIOVENOUS (AV) FISTULA CREATION- LEFT RADIOCEPHALIC (Left)  Patient Location: PACU  Anesthesia Type:MAC  Level of Consciousness: awake, alert , oriented and patient cooperative  Airway & Oxygen Therapy: Patient Spontanous Breathing and Patient connected to nasal cannula oxygen  Post-op Assessment: Report given to PACU RN and Post -op Vital signs reviewed and stable  Post vital signs: Reviewed and stable  Complications: No apparent anesthesia complications

## 2013-04-17 NOTE — Anesthesia Procedure Notes (Signed)
Procedure Name: MAC Date/Time: 04/17/2013 9:20 AM Performed by: Tyrone Nine Pre-anesthesia Checklist: Patient identified, Emergency Drugs available, Suction available, Patient being monitored and Timeout performed Patient Re-evaluated:Patient Re-evaluated prior to inductionOxygen Delivery Method: Nasal cannula Intubation Type: IV induction Placement Confirmation: positive ETCO2

## 2013-04-17 NOTE — Preoperative (Addendum)
Beta Blockers  Bystolic 5mg  po taken on 04-17-13 @6 :20

## 2013-04-17 NOTE — Interval H&P Note (Signed)
History and Physical Interval Note:  04/17/2013 8:49 AM  Phillip Herring  has presented today for surgery, with the diagnosis of End Stage Renal Disease  The various methods of treatment have been discussed with the patient and family. After consideration of risks, benefits and other options for treatment, the patient has consented to  Procedure(s): ARTERIOVENOUS (AV) FISTULA CREATION- LEFT RADIOCEPHALIC (Left) as a surgical intervention .  The patient's history has been reviewed, patient examined, no change in status, stable for surgery.  I have reviewed the patient's chart and labs.  Questions were answered to the patient's satisfaction.     EARLY, TODD

## 2013-04-17 NOTE — H&P (View-Only) (Signed)
Subjective:     Patient ID: Phillip Herring, male   DOB: 03/04/1941, 72 y.o.   MRN: [id number]  HPI this 72-year-old male was referred by Dr. Cynthia Dunham for evaluation for vascular access. The patient is right-handed. He has chronic renal insufficiency but has never been on hemodialysis. He does have a history of hypertension and type 2 diabetes mellitus. He denies any pain or numbness in either upper extremity.  Past Medical History  Diagnosis Date  . NIDDM (non-insulin dependent diabetes mellitus)   . Hypertension   . Undescended testicle   . CKD (chronic kidney disease)   . Vitamin D deficiency   . Prostate cancer   . Chronic hyperkalemia 12/23/2012    Sees Renal    History  Substance Use Topics  . Smoking status: Former Smoker    Quit date: 10/28/1982  . Smokeless tobacco: Never Used  . Alcohol Use: No    Family History  Problem Relation Age of Onset  . Cancer Sister     Allergies  Allergen Reactions  . Enalapril     Hyperkalemia.  . Metformin And Related Other (See Comments)    Renal insufficiency   . Tekturna [Aliskiren]     hyperkalemia    Current outpatient prescriptions:amLODipine (NORVASC) 10 MG tablet, Take 1 tablet (10 mg total) by mouth daily., Disp: 30 tablet, Rfl: 5;  aspirin 81 MG tablet, Take 81 mg by mouth daily.  , Disp: , Rfl: ;  cholecalciferol (VITAMIN D) 1000 UNITS tablet, Take 1,000 Units by mouth daily., Disp: , Rfl: ;  finasteride (PROSCAR) 5 MG tablet, Take 5 mg by mouth daily.  , Disp: , Rfl:  furosemide (LASIX) 80 MG tablet, Take 1 tablet by mouth daily., Disp: , Rfl: ;  glipiZIDE (GLUCOTROL XL) 10 MG 24 hr tablet, Take 1 tablet (10 mg total) by mouth daily., Disp: 30 tablet, Rfl: 5;  nebivolol (BYSTOLIC) 10 MG tablet, Take 1 tablet (10 mg total) by mouth daily., Disp: 30 tablet, Rfl: 11;  pioglitazone (ACTOS) 45 MG tablet, Take 1 tablet (45 mg total) by mouth daily., Disp: 30 tablet, Rfl: 5 sodium bicarbonate 650 MG tablet, Take 1 tablet by mouth 3  (three) times daily., Disp: , Rfl: ;  sodium polystyrene (KAYEXALATE) powder, Take 15 g by mouth once. Take 15 grams by mouth 2 times per week--on Mondays and Fridays, Disp: , Rfl:   BP 142/85  Pulse 76  Ht 5' 9" (1.753 m)  Wt 222 lb 6.4 oz (100.88 kg)  BMI 32.83 kg/m2  SpO2 97%  Body mass index is 32.83 kg/(m^2).         Review of Systems patient denies chest pain, dyspnea on exertion, PND, orthopnea, hemoptysis. Patient does complain of bilateral lower extremity edema. All other systems negative and a complete review of systems    Objective:   Physical Exam BP 142/85  Pulse 76  Ht 5' 9" (1.753 m)  Wt 222 lb 6.4 oz (100.88 kg)  BMI 32.83 kg/m2  SpO2 97%  Gen.-alert and oriented x3 in no apparent distress HEENT normal for age Lungs no rhonchi or wheezing Cardiovascular regular rhythm no murmurs carotid pulses 3+ palpable no bruits audible Abdomen soft nontender no palpable masses Musculoskeletal free of  major deformities Skin clear -no rashes Neurologic normal Lower extremities 3+ femoral and dorsalis pedis pulses palpable bilaterally with no edema Bilateral cephalic veins appear adequate on physical exam for AV fistula creation  Today I ordered bilateral upper extremity vein mapping which   are reviewed and interpreted. Cephalic veins appear adequate for fistula creation bilaterally.        Assessment:     Chronic renal insufficiency-stage IV needs vascular access    Plan:     Plan creation left radial cephalic AV fistula on Monday, November 24 by Dr. early Risks and benefits thoroughly discussed with patient including lack of maturation of fistula      

## 2013-04-17 NOTE — Anesthesia Preprocedure Evaluation (Addendum)
Anesthesia Evaluation  Patient identified by MRN, date of birth, ID band Patient awake    Reviewed: Allergy & Precautions, H&P , NPO status , Patient's Chart, lab work & pertinent test results, reviewed documented beta blocker date and time   Airway Mallampati: II TM Distance: >3 FB Neck ROM: Full    Dental  (+) Edentulous Upper and Edentulous Lower   Pulmonary former smoker,    Pulmonary exam normal       Cardiovascular Exercise Tolerance: Good hypertension, Pt. on medications and Pt. on home beta blockers + Peripheral Vascular Disease Rhythm:Regular Rate:Normal  13-Apr-2013 15:57:36 [facility] ROUTINE RECORD Sinus rhythm with Premature atrial complexes Cannot rule out Anterior infarct , age undetermined   Neuro/Psych  Headaches,    GI/Hepatic negative GI ROS, Neg liver ROS,   Endo/Other  diabetes, Well Controlled, Type 2, Oral Hypoglycemic Agents  Renal/GU ESRFRenal disease     Musculoskeletal  (+) Arthritis -, Osteoarthritis,    Abdominal   Peds  Hematology   Anesthesia Other Findings   Reproductive/Obstetrics                        Anesthesia Physical Anesthesia Plan  ASA: III  Anesthesia Plan: MAC   Post-op Pain Management:    Induction: Intravenous  Airway Management Planned: Nasal Cannula  Additional Equipment:   Intra-op Plan:   Post-operative Plan:   Informed Consent: I have reviewed the patients History and Physical, chart, labs and discussed the procedure including the risks, benefits and alternatives for the proposed anesthesia with the patient or authorized representative who has indicated his/her understanding and acceptance.   Dental advisory given  Plan Discussed with: CRNA and Anesthesiologist  Anesthesia Plan Comments:         Anesthesia Quick Evaluation

## 2013-04-19 ENCOUNTER — Encounter (HOSPITAL_COMMUNITY): Payer: Self-pay | Admitting: Vascular Surgery

## 2013-05-02 ENCOUNTER — Other Ambulatory Visit: Payer: Self-pay | Admitting: *Deleted

## 2013-05-02 DIAGNOSIS — N186 End stage renal disease: Secondary | ICD-10-CM

## 2013-05-02 DIAGNOSIS — Z4931 Encounter for adequacy testing for hemodialysis: Secondary | ICD-10-CM

## 2013-05-04 ENCOUNTER — Ambulatory Visit (INDEPENDENT_AMBULATORY_CARE_PROVIDER_SITE_OTHER): Payer: Medicare PPO | Admitting: Physician Assistant

## 2013-05-04 ENCOUNTER — Encounter: Payer: Self-pay | Admitting: Physician Assistant

## 2013-05-04 VITALS — BP 126/78 | HR 80 | Temp 98.4°F | Resp 18 | Wt 220.0 lb

## 2013-05-04 DIAGNOSIS — E559 Vitamin D deficiency, unspecified: Secondary | ICD-10-CM

## 2013-05-04 DIAGNOSIS — Z23 Encounter for immunization: Secondary | ICD-10-CM

## 2013-05-04 DIAGNOSIS — Q539 Undescended testicle, unspecified: Secondary | ICD-10-CM

## 2013-05-04 DIAGNOSIS — C61 Malignant neoplasm of prostate: Secondary | ICD-10-CM

## 2013-05-04 DIAGNOSIS — E1159 Type 2 diabetes mellitus with other circulatory complications: Secondary | ICD-10-CM

## 2013-05-04 DIAGNOSIS — N186 End stage renal disease: Secondary | ICD-10-CM

## 2013-05-04 DIAGNOSIS — E875 Hyperkalemia: Secondary | ICD-10-CM

## 2013-05-04 DIAGNOSIS — I10 Essential (primary) hypertension: Secondary | ICD-10-CM

## 2013-05-04 DIAGNOSIS — N189 Chronic kidney disease, unspecified: Secondary | ICD-10-CM

## 2013-05-04 NOTE — Progress Notes (Signed)
Patient ID: Phillip Herring MRN: [id number], DOB: 12-28-1940, 72 y.o. Date of Encounter: @DATE @  Chief Complaint: No chief complaint on file.   HPI: 72 y.o. year old male  Presents for a regular office visit.  He says he's been feeling good. Asked what he had done for Thanksgiving and what he will be going for Christmas. He says that if his fistula continues to heal ok he plans to go down to the beach for a few days. He has a good friend who used to live here who now [place] at  [place]-- he goes and stays with him fairly often. Also he has 4 brothers and 3 sisters who all live very close by.   He recently had AV fistula placed. He says he has follow up appointment with Dr. Arbie Cookey regarding this on 05/16/13. Says his next appointment with Dr. Eliott Nine is 05/26/13.  Says his blood pressure has been running 120 to 130 systolic. Says he is eating quit eating pork and other things that he knows was causing it to be elevated. Also continues to be compliant with low carbohydrate diet and oral potassium/renal diet.  He says that even with exertion he still has no chest pressure heaviness tightness or increased shortness of breath. He has seen no sores on his foot.  He did not bring in her blood sugar log sheet. However again he reports that he continues to check his sugar at different times different days. Says that he always gets it reading close to 100 no matter what time of day. Has had no problems with hypoglycemia and has not been getting any real high readings.   Past Medical History  Diagnosis Date  . NIDDM (non-insulin dependent diabetes mellitus)   . Hypertension   . Undescended testicle   . CKD (chronic kidney disease)   . Vitamin D deficiency   . Prostate cancer   . Chronic hyperkalemia 12/23/2012    Sees Renal  . Headache(784.0)      Home Meds: See attached medication section for current medication list. Any medications entered into computer today will not appear on this note's  list. The medications listed below were entered prior to today. Current Outpatient Prescriptions on File Prior to Visit  Medication Sig Dispense Refill  . amLODipine (NORVASC) 10 MG tablet Take 10 mg by mouth daily.      Marland Kitchen aspirin 81 MG chewable tablet Chew 81 mg by mouth daily.      . calcitRIOL (ROCALTROL) 0.25 MCG capsule Take 0.25 mcg by mouth every Monday, Wednesday, and Friday.      . cholecalciferol (VITAMIN D) 1000 UNITS tablet Take 1,000 Units by mouth daily.      . finasteride (PROSCAR) 5 MG tablet Take 5 mg by mouth daily.        . furosemide (LASIX) 80 MG tablet Take 80 mg by mouth daily.       Marland Kitchen glipiZIDE (GLUCOTROL XL) 10 MG 24 hr tablet Take 10 mg by mouth daily with breakfast.      . nebivolol (BYSTOLIC) 5 MG tablet Take 5 mg by mouth daily.      Marland Kitchen oxyCODONE (ROXICODONE) 5 MG immediate release tablet Take 1 tablet (5 mg total) by mouth every 6 (six) hours as needed for severe pain.  30 tablet  0  . pioglitazone (ACTOS) 45 MG tablet Take 45 mg by mouth daily.      . sodium bicarbonate 650 MG tablet Take 650 mg by mouth  3 (three) times daily.       . sodium polystyrene (KAYEXALATE) 15 GM/60ML suspension Take 15 g by mouth 2 (two) times a week. *does on Monday and Friday       No current facility-administered medications on file prior to visit.    Allergies:  Allergies  Allergen Reactions  . Enalapril     Hyperkalemia.  . Metformin And Related Other (See Comments)    Renal insufficiency   . Tekturna [Aliskiren]     hyperkalemia    History   Social History  . Marital Status: Single    Spouse Name: N/A    Number of Children: N/A  . Years of Education: N/A   Occupational History  . Not on file.   Social History Main Topics  . Smoking status: Former Smoker -- 1.00 packs/day for 25 years    Types: Cigarettes    Quit date: 10/28/1982  . Smokeless tobacco: Never Used  . Alcohol Use: No  . Drug Use: No  . Sexual Activity: Not on file   Other Topics Concern  .  Not on file   Social History Narrative  . No narrative on file    Family History  Problem Relation Age of Onset  . Cancer Sister      Review of Systems:  See HPI for pertinent ROS. All other ROS negative.    Physical Exam: There were no vitals taken for this visit., There is no weight on file to calculate BMI. General: WNWD WM Appears in no acute distress. Neck: Supple. No thyromegaly. No lymphadenopathy. No carotid bruits. Lungs: Clear bilaterally to auscultation without wheezes, rales, or rhonchi. Breathing is unlabored. Heart: RRR with S1 S2. No murmurs, rubs, or gallops. Abdomen: Soft, non-tender, non-distended with normoactive bowel sounds. No hepatomegaly. No rebound/guarding. No obvious abdominal masses. Musculoskeletal:  Strength and tone normal for age. Extremities/Skin: Warm and dry. No clubbing or cyanosis. No edema. No rashes or suspicious lesions. Neuro: Alert and oriented X 3. Moves all extremities spontaneously. Gait is normal. CNII-XII grossly in tact. Psych:  Responds to questions appropriately with a normal affect. Diabetic foot exam: See attached section. 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 Left foot has good sensation. There are no lesions or calluses her problem areas. He does have severe onychomycosis and also has some scaling and desquamation of his entire foot. His posterior tibial pulses 2+ and bounding. However I cannot palpate a dorsalis pedis pulse.      ASSESSMENT AND PLAN:  72 y.o. year old male with  1. Type II or unspecified type diabetes mellitus with peripheral circulatory disorders, uncontrolled(250.72) - Hemoglobin A1C, fingerstick  Diabetic nephropathy: Dr. Eliott Nine is managing this. See #3 below Diabetic retinopathy evaluation: He sees Dr. Allyne Gee at the retina specialist routinely. Diabetic neuropathy: He does report that the bottom of his foot left foot feels somewhat numb and tingly when he first stands up. However he says this improves when he  walks around. I have discussed with him that there is medication available to help with the symptoms but he continues to defer this again today.  No metformin secondary to kidney disease No ACE inhibitor or ARB secondary to hyperkalemia/kidney disease  2. Hypertension Blood pressure is at goal and well-controlled. No 80s or orbit secondary to hyperkalemia.  3. CKD (chronic kidney disease)  Dr. Eliott Nine has been sending me copies of her office notes and labs. Her last office note was dated 03/22/13. It Documents the following: Chronic kidney disease stage IV.  Secondary to diabetes. Disease complications include: Hyperkalemia  Metabolic acidosis  Hyperparathyroidism. Recent medication changes have included:  increasing  furosemide from 40 mg to 80 mg per day. Kayexalate twice a week. Sodium bicarbonate 3 times a day.  He had renal ultrasound 11/07/2012 which was within normal limits. Slightly enlarged prostate gland. Normal appearing kidneys. Right 11.9 cm left 10.8 cm  She sent a copy of his labs dated 03/22/13. CBC was completely normal. Hemoglobin 12.5 hematocrit 37.4. BMET showed sodium 138 potassium 4.7. Chloride and CO2 were normal. Calcium was borderline low at 8.5. Phosphorus  And Albumin were normal. BUN 45    Creatinine 2.2        GFR 22. PTH was 128 which is elevated.   4. Chronic hyperkalemia Controlled and managed by Dr. Eliott Nine as above.  5. End stage renal disease See #3 above. As well he recently got AV fistula. Has follow up with Dr. early 12/23.  6. H/O Prostate cancer  7.H/O Undescended testicle   8. Vitamin D deficiency Verified with patient he is now taking over-the-counter vitamin D 1000 units daily. This was started at the last office visit. He had been given prescription vitamin D after he had labs that were 2014. Did take that prescription weekly for 3 months. However when he completed the prescription he was taking no over-the-counter vitamin D until  last office visit. Followup lab on 10/28/12 had shown vitamin D 33.  9. lipids: Presently his lipid profile has always been excellent. This is even without medication.  I repeated this again for followup on 10/28/12. Total cholesterol 158 triglycerides 271 HDL 34 LDL 70  10. History of right foot Pirgoff amputation  11. History of wounds on the left foot May 2012. Lower extremity arterial Dopplers May 2012 showed no evidence of arterial insufficiency.  12. Screening colonoscopy: Done 01/2010. Repeat 5 years secondary to poor prep.  13. Immunizations:  Influenza vaccine: He has not had one for this season. He is agreeable to get one today. Pneumonia vaccine: He had Pneumovax in 2010. Up-date with the Prevnar 13 today. Tetanus: Given in 2010 Zostavax: I discussed this with him 07/22/12 and again on 02/01/13. He is still forgotten to followup with checking with insurance. I wrote on his AVS today for him to call his insurance company and find out the cause of this.  Diabetic foot exam will be entered into the attached section.   Routine office visit with me in 3 months or sooner if needed.   Murray Hodgkins [place], [place], [facility] 05/04/2013 8:26 AM

## 2013-05-15 ENCOUNTER — Encounter: Payer: Self-pay | Admitting: Vascular Surgery

## 2013-05-16 ENCOUNTER — Ambulatory Visit (INDEPENDENT_AMBULATORY_CARE_PROVIDER_SITE_OTHER): Payer: Self-pay | Admitting: Vascular Surgery

## 2013-05-16 ENCOUNTER — Encounter: Payer: Self-pay | Admitting: Vascular Surgery

## 2013-05-16 VITALS — BP 163/82 | HR 64 | Resp 16 | Ht 72.0 in | Wt 224.0 lb

## 2013-05-16 DIAGNOSIS — N186 End stage renal disease: Secondary | ICD-10-CM

## 2013-05-16 NOTE — Progress Notes (Signed)
The patient presents today for followup of his left Cimino AV fistula creation by myself on 04/17/2013. He is complete healing of his wrist incision. He does have good early dilatation of the cephalic vein. He does have an area that appears to be more pulsatile near the wrist anastomosis. I feel that there may be some narrowing in the vein just above this area. He does have a thrill and bruit throughout the forearm fistula. He will continue his usual activities without limitations. We will see him again in one month for further followup. I explained that he may require a shuntogram and possible angioplasty of this area that appears to be slowing his maturation.

## 2013-06-19 ENCOUNTER — Encounter: Payer: Self-pay | Admitting: Vascular Surgery

## 2013-06-20 ENCOUNTER — Encounter: Payer: Self-pay | Admitting: *Deleted

## 2013-06-20 ENCOUNTER — Other Ambulatory Visit: Payer: Self-pay | Admitting: *Deleted

## 2013-06-20 ENCOUNTER — Encounter: Payer: Self-pay | Admitting: Vascular Surgery

## 2013-06-20 ENCOUNTER — Ambulatory Visit (INDEPENDENT_AMBULATORY_CARE_PROVIDER_SITE_OTHER): Payer: Self-pay | Admitting: Vascular Surgery

## 2013-06-20 VITALS — BP 156/95 | HR 78 | Ht 72.0 in | Wt 225.0 lb

## 2013-06-20 DIAGNOSIS — N186 End stage renal disease: Secondary | ICD-10-CM

## 2013-06-20 NOTE — Progress Notes (Signed)
The patient presents today for continued followup of his left radiocephalic AV fistula placement by myself on 04/17/2013. I seen in one month ago. He had good Taison Celani maturation but did have an area of possible stenosis in the vein in the distal forearm above the venous anastomosis. He is seen today for further followup. He has had complete resolution of any soreness around the time of surgery. He does return to his usual activities. He reports that he has had no progression of his renal insufficiency  Past Medical History  Diagnosis Date  . NIDDM (non-insulin dependent diabetes mellitus)   . Hypertension   . Undescended testicle   . CKD (chronic kidney disease)   . Vitamin D deficiency   . Prostate cancer   . Chronic hyperkalemia 12/23/2012    Sees Renal  . Headache(784.0)     History  Substance Use Topics  . Smoking status: Former Smoker -- 1.00 packs/day for 25 years    Types: Cigarettes    Quit date: 10/28/1982  . Smokeless tobacco: Never Used  . Alcohol Use: No    Family History  Problem Relation Age of Onset  . Cancer Sister     Allergies  Allergen Reactions  . Enalapril     Hyperkalemia.  . Metformin And Related Other (See Comments)    Renal insufficiency   . Tekturna [Aliskiren]     hyperkalemia    Current outpatient prescriptions:amLODipine (NORVASC) 10 MG tablet, Take 10 mg by mouth daily., Disp: , Rfl: ;  aspirin 81 MG chewable tablet, Chew 81 mg by mouth daily., Disp: , Rfl: ;  calcitRIOL (ROCALTROL) 0.25 MCG capsule, Take 0.25 mcg by mouth every Monday, Wednesday, and Friday., Disp: , Rfl: ;  cholecalciferol (VITAMIN D) 1000 UNITS tablet, Take 1,000 Units by mouth daily., Disp: , Rfl:  finasteride (PROSCAR) 5 MG tablet, Take 5 mg by mouth daily.  , Disp: , Rfl: ;  furosemide (LASIX) 80 MG tablet, Take 80 mg by mouth daily. , Disp: , Rfl: ;  glipiZIDE (GLUCOTROL XL) 10 MG 24 hr tablet, Take 10 mg by mouth daily with breakfast., Disp: , Rfl: ;  nebivolol (BYSTOLIC) 5 MG  tablet, Take 5 mg by mouth daily., Disp: , Rfl:  oxyCODONE (ROXICODONE) 5 MG immediate release tablet, Take 1 tablet (5 mg total) by mouth every 6 (six) hours as needed for severe pain., Disp: 30 tablet, Rfl: 0;  pioglitazone (ACTOS) 45 MG tablet, Take 45 mg by mouth daily., Disp: , Rfl: ;  sodium bicarbonate 650 MG tablet, Take 650 mg by mouth 3 (three) times daily. , Disp: , Rfl:  sodium polystyrene (KAYEXALATE) 15 GM/60ML suspension, Take 15 g by mouth 2 (two) times a week. *does on Monday and Friday, Disp: , Rfl:   BP 156/95  Pulse 78  Ht 6' (1.829 m)  Wt 225 lb (102.059 kg)  BMI 30.51 kg/m2  SpO2 96%  Body mass index is 30.51 kg/(m^2).       On physical exam his wrist incision is completely healed. He does have pulsatile flow in the proximal portion of the fistula. He does have good size maturation and the mid forearm. There is obviously a sclerotic area of vein proximally 3 cm from the arteriovenous anastomosis.  Impression and plan: Chronic renal insufficiency with AV fistula creation on 04/17/2013. I am quite concerned that this area of narrowing and significant. He has a pulsatile nature of the proximal vein and slight thrill in the distal vein. I have recommended  angioplasty of this area as an outpatient to improve his long-term chances of success with this fistula. I explained we would use a very slight amount of iodinated contrast and it studies have shown despite is no significant risk for worsening renal function. We will schedule this as an outpatient on 06/28/2013 at [facility] [place]

## 2013-06-21 ENCOUNTER — Encounter (HOSPITAL_COMMUNITY): Payer: Self-pay | Admitting: Pharmacy Technician

## 2013-06-22 ENCOUNTER — Other Ambulatory Visit: Payer: Self-pay | Admitting: Family Medicine

## 2013-06-22 MED ORDER — NEBIVOLOL HCL 5 MG PO TABS: 5.0000 mg | ORAL_TABLET | Freq: Every day | ORAL | Status: DC

## 2013-06-28 ENCOUNTER — Encounter (HOSPITAL_COMMUNITY): Admission: RE | Payer: Self-pay | Source: Ambulatory Visit

## 2013-06-28 SURGERY — ASSESSMENT, SHUNT FUNCTION, WITH CONTRAST RADIOGRAPHIC STUDY
Anesthesia: LOCAL

## 2013-06-29 ENCOUNTER — Ambulatory Visit (HOSPITAL_COMMUNITY): Admission: RE | Admit: 2013-06-29 | Payer: Medicare PPO | Source: Ambulatory Visit | Admitting: Vascular Surgery

## 2013-07-01 ENCOUNTER — Other Ambulatory Visit: Payer: Self-pay | Admitting: Family Medicine

## 2013-07-03 ENCOUNTER — Other Ambulatory Visit: Payer: Self-pay | Admitting: *Deleted

## 2013-07-13 ENCOUNTER — Encounter (HOSPITAL_COMMUNITY): Payer: Self-pay | Admitting: Pharmacy Technician

## 2013-08-02 ENCOUNTER — Telehealth: Payer: Self-pay | Admitting: Family Medicine

## 2013-08-02 ENCOUNTER — Ambulatory Visit (INDEPENDENT_AMBULATORY_CARE_PROVIDER_SITE_OTHER): Payer: Medicare PPO | Admitting: Physician Assistant

## 2013-08-02 ENCOUNTER — Encounter: Payer: Self-pay | Admitting: Physician Assistant

## 2013-08-02 VITALS — BP 112/70 | HR 84 | Temp 97.4°F | Resp 18 | Ht 69.75 in | Wt 229.0 lb

## 2013-08-02 DIAGNOSIS — N186 End stage renal disease: Secondary | ICD-10-CM

## 2013-08-02 DIAGNOSIS — E559 Vitamin D deficiency, unspecified: Secondary | ICD-10-CM

## 2013-08-02 DIAGNOSIS — Q539 Undescended testicle, unspecified: Secondary | ICD-10-CM

## 2013-08-02 DIAGNOSIS — E875 Hyperkalemia: Secondary | ICD-10-CM

## 2013-08-02 DIAGNOSIS — N189 Chronic kidney disease, unspecified: Secondary | ICD-10-CM

## 2013-08-02 DIAGNOSIS — C61 Malignant neoplasm of prostate: Secondary | ICD-10-CM

## 2013-08-02 DIAGNOSIS — I10 Essential (primary) hypertension: Secondary | ICD-10-CM

## 2013-08-02 DIAGNOSIS — E1159 Type 2 diabetes mellitus with other circulatory complications: Secondary | ICD-10-CM

## 2013-08-02 LAB — HEMOGLOBIN A1C, FINGERSTICK: Hgb A1C (fingerstick): 8.6 % — ABNORMAL HIGH (ref <5.7)

## 2013-08-02 NOTE — Telephone Encounter (Signed)
Message copied by Olena Mater on Wed Aug 02, 2013  4:21 PM ------      Message from: Dena Billet      Created: Wed Aug 02, 2013  2:55 PM       At his visit today patient did not have an actual blood sugar log sheet to look at.      However he reported that his blood sugar readings were good.      Tell him that his A1c is indicating high blood sugars.      Tell him to schedule a followup office visit with me for one week.      Between now and then, he needs to check blood sugars at least 2 times every day.      Each day one of these readings needs to be fasting morning blood sugar.      He can alternate checking his blood sugars 2 hours after different meals on different days. One day he can check it 2 hours after breakfast, 1 day check it 2 hours after lunch, or check it 2 hours after supper etc.      Document these readings on a log sheet or piece of paper. Bring this to his appointment in [place]. ------

## 2013-08-02 NOTE — Telephone Encounter (Signed)
Pt aware of lab results and provider recommendations.  Explained about how to take blood sugars.  States he will call back to make appointment for next week.

## 2013-08-02 NOTE — Progress Notes (Signed)
Patient ID: Phillip Herring MRN: [id number], DOB: Jan 12, 1941, 73 y.o. Date of Encounter: @DATE @  Chief Complaint:  Chief Complaint  Patient presents with  . 3 month check up    is fasting    HPI: 73 y.o. year old male  presents in routine follow up office visit.  He says that he has been feeling good. He says that he has [place] it to the beach to visit his friend who used to live here but has moved to the beach. He probably will go back and visit him again here in the next few weeks.  He has 4 brothers and 3 sisters who all live very close by. One of his sisters helps him manage his medicines and medical care.  He has continued to see Dr. Lorrene Reid regarding his chronic kidney disease. She sends me her office visit notes and lab results routinely.  Since his last visit with me in December, she has sent me a copy of her notes from 05/26/13 as well as 07/28/13.  He had left AV fistula done in 04/17/13. On followup there was a bruit and thrill but a pulsatile area and suggested of narrowing. Patient states that he has followup fistulogram scheduled for tomorrow.  It was originally scheduled for February but has gotten postponed secondary to scheduling issues and weather.  Says his blood pressure has been running really good whenever he goes to appointments. Says he has quit eating bacon and sausage and is really careful with what he is eating now. Also has low potassium/renal diet. Also has been compliant with low carbohydrate diet.  Says that even with exertion he still has the chest pressure, heaviness, tightness, or increased shortness of breath.   He has been no sores on his foot.   He did not bring in a blood sugar log sheet. However he reports that his sugars continue to run good. Visit fasting morning sugars are always around 100. This is after a meal they may be around 120. Says he has had no hypoglycemia.   Past Medical History  Diagnosis Date  . NIDDM (non-insulin dependent diabetes  mellitus)   . Hypertension   . Undescended testicle   . CKD (chronic kidney disease)   . Vitamin D deficiency   . Prostate cancer   . Chronic hyperkalemia 12/23/2012    Sees Renal  . Headache(784.0)      Home Meds: See attached medication section for current medication list. Any medications entered into computer today will not appear on this note's list. The medications listed below were entered prior to today. Current Outpatient Prescriptions on File Prior to Visit  Medication Sig Dispense Refill  . amLODipine (NORVASC) 10 MG tablet Take 10 mg by mouth daily.      Marland Kitchen aspirin 81 MG chewable tablet Chew 81 mg by mouth daily.      . calcitRIOL (ROCALTROL) 0.25 MCG capsule Take 0.25 mcg by mouth every Monday, Wednesday, and Friday.      . cholecalciferol (VITAMIN D) 1000 UNITS tablet Take 1,000 Units by mouth daily.      . finasteride (PROSCAR) 5 MG tablet Take 5 mg by mouth daily.        . furosemide (LASIX) 80 MG tablet Take 80 mg by mouth daily.       Marland Kitchen glipiZIDE (GLUCOTROL XL) 10 MG 24 hr tablet Take 10 mg by mouth daily with breakfast.      . nebivolol (BYSTOLIC) 5 MG tablet Take 1 tablet (  5 mg total) by mouth daily.  30 tablet  5  . pioglitazone (ACTOS) 45 MG tablet Take 45 mg by mouth daily.      . sodium bicarbonate 650 MG tablet Take 650 mg by mouth 3 (three) times daily.       . sodium polystyrene (KAYEXALATE) 15 GM/60ML suspension Take 15 g by mouth 2 (two) times a week. *does on Monday and Friday       No current facility-administered medications on file prior to visit.    Allergies:  Allergies  Allergen Reactions  . Enalapril Other (See Comments)    Hyperkalemia.  . Metformin And Related Other (See Comments)    Renal insufficiency   . Tekturna [Aliskiren] Other (See Comments)    hyperkalemia    History   Social History  . Marital Status: Single    Spouse Name: N/A    Number of Children: N/A  . Years of Education: N/A   Occupational History  . Not on file.    Social History Main Topics  . Smoking status: Former Smoker -- 1.00 packs/day for 25 years    Types: Cigarettes    Quit date: 10/28/1982  . Smokeless tobacco: Never Used  . Alcohol Use: No  . Drug Use: No  . Sexual Activity: Not on file   Other Topics Concern  . Not on file   Social History Narrative  . No narrative on file    Family History  Problem Relation Age of Onset  . Cancer Sister      Review of Systems:  See HPI for pertinent ROS. All other ROS negative.    Physical Exam: Blood pressure 112/70, pulse 84, temperature 97.4 F (36.3 C), temperature source Oral, resp. rate 18, height 5' 9.75" (1.772 m), weight 229 lb (103.874 kg)., Body mass index is 33.08 kg/(m^2). General: WNWD WM. Appears in no acute distress.  Neck: Supple. No thyromegaly. No lymphadenopathy. no carotid bruits.  Lungs: Clear bilaterally to auscultation without wheezes, rales, or rhonchi. Breathing is unlabored. Heart: RRR with S1 S2. No murmurs, rubs, or gallops. Abdomen: Soft, non-tender, non-distended with normoactive bowel sounds. No hepatomegaly. No rebound/guarding. No obvious abdominal masses. Musculoskeletal:  Strength and tone normal for age. Extremities/Skin: Warm and dry. No clubbing or cyanosis. No edema. No rashes or suspicious lesions. Neuro: Alert and oriented X 3. Moves all extremities spontaneously. Gait is normal. CNII-XII grossly in tact. Psych:  Responds to questions appropriately with a normal affect. 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 Left foot has good sensation. There are no lesions or calluses or problem areas. He does have severe onychomycosis and has scaling and desquamation of the entire foot. His posterior tibial pulse is 2+ and bounding. However I cannot palpate a dorsalis pedis pulse.      ASSESSMENT AND PLAN:  73 y.o. year old male with   1. diabetes mellitus type 2 -- finger stick A1c Diabetic nephropathy: Dr. Lorrene Reid is managing this. See #3  below. Diabetic retinopathy evaluation: He sees Dr. Baird Cancer at the retina specialist routinely. Diabetic neuropathy: He does report that the problem is that of his left foot feels somewhat numb and tingly when he first stands up. However he states this improves when he walks around. I have offered him prescription medication to help the symptoms but he defers this again today.  No metformin secondary to kidney disease. No ACE inhibitor or ARB secondary to hyperkalemia/kidney disease.  2. Hypertension Blood pressure is at goal and well-controlled.  No ACE Inh or ARB secondary to hyperkalemia.   3. CKD (chronic kidney disease)   Dr. Lorrene Reid has been sending me copies of her office notes and labs. Her last office note was dated 07/28/13. It Documents the following: Chronic kidney disease stage IV. Secondary to diabetes. Disease complications include: Hyperkalemia   Metabolic acidosis   Hyperparathyroidism. Recent medication changes have included:  increasing  furosemide from 40 mg to 80 mg per day. Kayexalate twice a week. Sodium bicarbonate 3 times a day.   He had renal ultrasound 11/07/2012 which was within normal limits. Slightly enlarged prostate gland. Normal appearing kidneys. Right 11.9 cm left 10.8 cm   She sent a copy of his labs dated 03/22/13. CBC was completely normal. Hemoglobin 12.5 hematocrit 37.4. BMET showed sodium 138 potassium 4.7. Chloride and CO2 were normal. Calcium was borderline low at 8.5. Phosphorus  And Albumin were normal. BUN 45    Creatinine 2.2        GFR 22. PTH was 128 which is elevated.  MOST RECENTLY,  Only lab that she sent me from her visit 07/28/2013 is a BMET which shows BUN 59   creatinine 2.72   potassium 4.7   . She made no changes in medications based on these labs. Plan for followup office visit in 2 months.  SHE ALSO SENT ME A COPY OF LABS DONE 05/26/13 WHICH INCLUDED CBC, BMET, PTH   4. Chronic hyperkalemia Controlled and managed by Dr.  Lorrene Reid as above.   5. End stage renal disease See #3 above. As well he recently got AV fistula. Has follow up with Dr. early 12/23.   6. H/O Prostate cancer   7.H/O Undescended testicle  8. Vitamin D deficiency Verified with patient he is now taking over-the-counter vitamin D 1000 units daily. This was started at [facility] 9/14. He had been given prescription vitamin D after he had labs that were 2014. Did take that prescription weekly for 3 months. However when he completed the prescription he was taking no over-the-counter vitamin D until 9/14 office visit. Followup lab on 10/28/12 had shown vitamin D 33.   9. lipids: Presently his lipid profile has always been excellent. This is even without medication.  I repeated this again for followup on 10/28/12. Total cholesterol 158 triglycerides 271 HDL 34 LDL 70   10. 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 History of wounds on the left foot May 2012. Lower extremity arterial Dopplers May 2012 showed no evidence of arterial insufficiency.   12. Screening colonoscopy: Done 01/2010. Repeat 5 years secondary to poor prep.   13. Immunizations:   Influenza vaccine: He received that at his office visit here 05/04/13 Pneumonia vaccine: He had Pneumovax in 2010. Up-dated with  Prevnar 13  At [place] here 12/ 2014 Tetanus: Given in 2010 Zostavax: I discussed this with him 07/22/12 and again on 02/01/13.Today, again, he states He  still forgot to followup with checking with insurance. I wrote on his AVS at [place] for him to call his insurance company and find out the cause of this. If him about this again today but he says that he forgot all about it.   Diabetic foot exam will be entered into the attached section.     Routine office visit with me in 3 months or sooner if needed.        Marin Olp [place], [place], [facility] 08/02/2013 2:59 PM

## 2013-08-03 ENCOUNTER — Encounter (HOSPITAL_COMMUNITY)
Admission: RE | Disposition: A | Discharge status: Home / Self Care | Payer: Self-pay | Source: Ambulatory Visit | Attending: Vascular Surgery

## 2013-08-03 ENCOUNTER — Ambulatory Visit (HOSPITAL_COMMUNITY)
Admission: RE | Admit: 2013-08-03 | Discharge: 2013-08-03 | Disposition: A | Discharge status: Home / Self Care | Payer: Medicare PPO | Source: Ambulatory Visit | Attending: Vascular Surgery | Admitting: Vascular Surgery

## 2013-08-03 ENCOUNTER — Telehealth: Payer: Self-pay | Admitting: Vascular Surgery

## 2013-08-03 DIAGNOSIS — N189 Chronic kidney disease, unspecified: Secondary | ICD-10-CM | POA: Insufficient documentation

## 2013-08-03 DIAGNOSIS — E559 Vitamin D deficiency, unspecified: Secondary | ICD-10-CM | POA: Insufficient documentation

## 2013-08-03 DIAGNOSIS — Z87891 Personal history of nicotine dependence: Secondary | ICD-10-CM | POA: Insufficient documentation

## 2013-08-03 DIAGNOSIS — I129 Hypertensive chronic kidney disease with stage 1 through stage 4 chronic kidney disease, or unspecified chronic kidney disease: Secondary | ICD-10-CM | POA: Insufficient documentation

## 2013-08-03 DIAGNOSIS — Y832 Surgical operation with anastomosis, bypass or graft as the cause of abnormal reaction of the patient, or of later complication, without mention of misadventure at the time of the procedure: Secondary | ICD-10-CM | POA: Insufficient documentation

## 2013-08-03 DIAGNOSIS — E875 Hyperkalemia: Secondary | ICD-10-CM | POA: Insufficient documentation

## 2013-08-03 DIAGNOSIS — I871 Compression of vein: Secondary | ICD-10-CM | POA: Insufficient documentation

## 2013-08-03 DIAGNOSIS — Z7982 Long term (current) use of aspirin: Secondary | ICD-10-CM | POA: Insufficient documentation

## 2013-08-03 DIAGNOSIS — Z8546 Personal history of malignant neoplasm of prostate: Secondary | ICD-10-CM | POA: Insufficient documentation

## 2013-08-03 DIAGNOSIS — T82898A Other specified complication of vascular prosthetic devices, implants and grafts, initial encounter: Secondary | ICD-10-CM

## 2013-08-03 DIAGNOSIS — E119 Type 2 diabetes mellitus without complications: Secondary | ICD-10-CM | POA: Insufficient documentation

## 2013-08-03 DIAGNOSIS — Z992 Dependence on renal dialysis: Secondary | ICD-10-CM | POA: Insufficient documentation

## 2013-08-03 DIAGNOSIS — T82598A Other mechanical complication of other cardiac and vascular devices and implants, initial encounter: Secondary | ICD-10-CM | POA: Insufficient documentation

## 2013-08-03 HISTORY — PX: SHUNTOGRAM: SHX5491

## 2013-08-03 LAB — POCT I-STAT, CHEM 8
BUN: 70 mg/dL — ABNORMAL HIGH (ref 6–23)
CALCIUM ION: 1.15 mmol/L (ref 1.13–1.30)
Chloride: 106 mEq/L (ref 96–112)
Creatinine, Ser: 2.7 mg/dL — ABNORMAL HIGH (ref 0.50–1.35)
GLUCOSE: 81 mg/dL (ref 70–99)
HEMATOCRIT: 39 % (ref 39.0–52.0)
Hemoglobin: 13.3 g/dL (ref 13.0–17.0)
Potassium: 4.2 mEq/L (ref 3.7–5.3)
Sodium: 143 mEq/L (ref 137–147)
TCO2: 24 mmol/L (ref 0–100)

## 2013-08-03 LAB — GLUCOSE, CAPILLARY: Glucose-Capillary: 87 mg/dL (ref 70–99)

## 2013-08-03 SURGERY — ASSESSMENT, SHUNT FUNCTION, WITH CONTRAST RADIOGRAPHIC STUDY
Anesthesia: LOCAL

## 2013-08-03 MED ORDER — LIDOCAINE HCL (PF) 1 % IJ SOLN
INTRAMUSCULAR | Status: AC
  Filled 2013-08-03: qty 30

## 2013-08-03 MED ORDER — HEPARIN SODIUM (PORCINE) 1000 UNIT/ML IJ SOLN
INTRAMUSCULAR | Status: AC
  Filled 2013-08-03: qty 1

## 2013-08-03 MED ORDER — HEPARIN (PORCINE) IN NACL 2-0.9 UNIT/ML-% IJ SOLN
INTRAMUSCULAR | Status: AC
  Filled 2013-08-03: qty 500

## 2013-08-03 MED ORDER — SODIUM CHLORIDE 0.9 % IV SOLN: 1.0000 mL/kg/h | INTRAVENOUS | Status: DC

## 2013-08-03 MED ORDER — SODIUM CHLORIDE 0.9 % IV SOLN
INTRAVENOUS | Status: DC
Start: 2013-08-03 — End: 2013-08-03

## 2013-08-03 MED ORDER — ACETAMINOPHEN 325 MG PO TABS: 650.0000 mg | ORAL_TABLET | ORAL | Status: DC | PRN

## 2013-08-03 NOTE — Telephone Encounter (Addendum)
Message copied by Gena Fray on Thu Aug 03, 2013  2:16 PM ------      Message from: Peter Minium K      Created: Thu Aug 03, 2013  9:35 AM      Regarding: Schedule                   ----- Message -----         From: Conrad Corwith, MD         Sent: 08/03/2013   9:18 AM           To: [facility] [address]            Phillip Herring      [id number]      [birth year]            PROCEDURE:      1.  left radiocephalic arteriovenous fistula cannulation under ultrasound guidance      2.  left arm fistulogram      3.  Venoplasty of forearm cephalic vein x 2 (4 mm x 40 mm, 6 mm x 60 mm)            Follow-up: 4 weeks with Dr. Donnetta Hutching ------  08/03/13: spoke with pt, dpm

## 2013-08-03 NOTE — H&P (Signed)
Brief History and Physical  History of Present Illness  Phillip Herring is a 73 y.o. 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  The patient presents today for L arm fistulogram, possible intervention.    Past Medical History  Diagnosis Date  . NIDDM (non-insulin dependent diabetes mellitus)   . Hypertension   . Undescended testicle   . CKD (chronic kidney disease)   . Vitamin D deficiency   . Prostate cancer   . Chronic hyperkalemia 12/23/2012    Sees Renal  . [id number](132.4)     Past Surgical History  Procedure Laterality Date  . Foot amputation    . Eye surgery Bilateral     laser  . Av fistula placement Left 04/17/2013    Procedure: ARTERIOVENOUS (AV) FISTULA CREATION- LEFT RADIOCEPHALIC;  Surgeon: Rosetta Posner, MD;  Location: [facility];  Service: Vascular;  Laterality: Left;    History   Social History  . Marital Status: Single    Spouse Name: N/A    Number of Children: N/A  . Years of Education: N/A   Occupational History  . Not on file.   Social History Main Topics  . Smoking status: Former Smoker -- 1.00 packs/day for 25 years    Types: Cigarettes    Quit date: 10/28/1982  . Smokeless tobacco: Never Used  . Alcohol Use: No  . Drug Use: No  . Sexual Activity: Not on file   Other Topics Concern  . Not on file   Social History Narrative  . No narrative on file    Family History  Problem Relation Age of Onset  . Cancer Sister     No current facility-administered medications on file prior to encounter.   Current Outpatient Prescriptions on File Prior to Encounter  Medication Sig Dispense Refill  . amLODipine (NORVASC) 10 MG tablet Take 10 mg by mouth daily.      Marland Kitchen aspirin 81 MG chewable tablet Chew 81 mg by mouth daily.      . calcitRIOL (ROCALTROL) 0.25 MCG capsule Take 0.25 mcg by mouth every Monday, Wednesday, and Friday.      . cholecalciferol (VITAMIN D) 1000 UNITS tablet Take 1,000 Units by mouth daily.      .  finasteride (PROSCAR) 5 MG tablet Take 5 mg by mouth daily.        . furosemide (LASIX) 80 MG tablet Take 80 mg by mouth daily.       Marland Kitchen glipiZIDE (GLUCOTROL XL) 10 MG 24 hr tablet Take 10 mg by mouth daily with breakfast.      . nebivolol (BYSTOLIC) 5 MG tablet Take 1 tablet (5 mg total) by mouth daily.  30 tablet  5  . pioglitazone (ACTOS) 45 MG tablet Take 45 mg by mouth daily.      . sodium bicarbonate 650 MG tablet Take 650 mg by mouth 3 (three) times daily.       . sodium polystyrene (KAYEXALATE) 15 GM/60ML suspension Take 15 g by mouth 2 (two) times a week. *does on Monday and Friday        Allergies  Allergen Reactions  . Enalapril Other (See Comments)    Hyperkalemia.  . Metformin And Related Other (See Comments)    Renal insufficiency   . Tekturna [Aliskiren] Other (See Comments)    hyperkalemia    Review of Systems: As listed above, otherwise negative.  Physical Examination  Filed Vitals:   08/03/13 0713  BP:  125/68  Pulse: 77  Temp: 97.4 F (36.3 C)  TempSrc: Oral  Resp: 20  Height: 6' (1.829 m)  Weight: 225 lb (102.059 kg)  SpO2: 97%    General: A&O x 3, WDWN  Pulmonary: Sym exp, good air movt, CTAB, no rales, rhonchi, & wheezing  Cardiac: RRR, Nl S1, S2, no Murmurs, rubs or gallops  Gastrointestinal: soft, NTND, -G/R, - HSM, - masses, - CVAT B  Musculoskeletal: M/S 5/5 throughout , Extremities without ischemic changes , L RC AVF somewhat pulsatile  Laboratory See iStat  Medical Decision Making  Phillip Herring is a 73 y.o. 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   The patient is scheduled for: L arm fistulogram, possible intervention  I discussed with the patient the nature of angiographic procedures, especially the limited patencies of any endovascular intervention.  The patient is aware of that the risks of an angiographic procedure include but are not limited to: bleeding, infection, access site complications, renal failure,  embolization, rupture of vessel, dissection, possible need for emergent surgical intervention, possible need for surgical procedures to treat the patient's pathology, and stroke and death.    The patient is aware of the risks and agrees to proceed.  Adele Barthel, MD [facility] Office: [phone] Pager: [phone]  08/03/2013, 7:34 AM

## 2013-08-03 NOTE — Op Note (Signed)
     OPERATIVE NOTE   PROCEDURE: 1.  left radiocephalic arteriovenous fistula cannulation under ultrasound guidance 2.  left arm fistulogram 3.  Venoplasty of forearm cephalic vein x 2 (4 mm x 40 mm, 6 mm x 60 mm)  PRE-OPERATIVE DIAGNOSIS: Malfunctioning left radiocephalic arteriovenous fistula  POST-OPERATIVE DIAGNOSIS: same as above   SURGEON: Adele Barthel, MD  ANESTHESIA: local  ESTIMATED BLOOD LOSS: 5 cc  FINDING(S): 1. Near occluded cephalic vein segment: mostly resolved after serial venoplasty, some evidence of frozen valve 2. Patent fistula otherwise with predominant brachial vein drainage  SPECIMEN(S):  None  CONTRAST: 33 cc  INDICATIONS: Phillip Herring is a 73 y.o. male who presents with malfunctioning left radiocephalic arteriovenous fistula.  The patient is scheduled for left arm fistulogram, possible intervention.  The patient is aware the risks include but are not limited to: bleeding, infection, thrombosis of the cannulated access, and possible anaphylactic reaction to the contrast.  The patient is aware of the risks of the procedure and elects to proceed forward.  DESCRIPTION: After full informed written consent was obtained, the patient was brought back to the angiography suite and placed supine upon the angiography table.  The patient was connected to monitoring equipment.  The left arm was prepped and draped in the standard fashion for a percutaneous access intervention.  Under ultrasound guidance, the left radiocephalic arteriovenous fistula was cannulated with a micropuncture needle.  The microwire was advanced into the fistula and the needle was exchanged for the a microsheath, which was lodged 2 cm into the access.  The wire was removed and the sheath was connected to the IV extension tubing.  Hand injections were completed to image the access from the antecubitum up to the level of axilla.  The central venous structures were also imaged by hand injections.  Based  on the images, this patient will need: venoplasty of the forearm cephalic vein.  A Glidewire was advanced into the axillary vein.  An endhole catheter was placed over the wire and the wire was exchanged for a [employer] wire.  A short 6-Fr sheath was placed over the wire.  3000 units of Heparin was given. Based on the the imaging, a 4 mm x 40 mm angioplasty balloon was selected.  The balloon was centered around the near occlusion and inflated to 10 atm for 1 minute.  On completion imaging, a >30% residual stenosis is present.  At this point, the balloon was exchanged for a 6 mm x 60 mm angioplasty balloon.  The balloon was centered around the stenosis and inflated to 10 atm for 1 minutes.  On completion imaging, a ~30% residual stenosis is present.  I replaced the balloon and inflated it centered on the near occlusion to 12 atm for 1 minute to break spasm.  Based on the completion imaging, no further intervention is necessary.  The wire and balloon were removed from the sheath.  A 4-0 Monocryl purse-string suture was sewn around the sheath.  The sheath was removed while tying down the suture.  A sterile bandage was applied to the puncture site.  COMPLICATIONS: none  CONDITION: stable  Adele Barthel, MD [facility] Office: [phone] Pager: [phone]  08/03/2013 9:13 AM

## 2013-08-03 NOTE — Discharge Instructions (Signed)
AV Fistula °Care After °Refer to this sheet in the next few weeks. These instructions provide you with information on caring for yourself after your procedure. Your caregiver may also give you more specific instructions. Your treatment has been planned according to current medical practices, but problems sometimes occur. Call your caregiver if you have any problems or questions after your procedure. °HOME CARE INSTRUCTIONS  °· Do not drive a car or take public transportation alone. °· Do not drink alcohol. °· Only take medicine that has been prescribed by your caregiver. °· Do not sign important papers or make important decisions. °· Have a responsible person with you. °· Ask your caregiver to show you how to check your access at home for a vibration (called a "thrill") or for a sound (called a "bruit" pronounced brew-ee). °· Your vein will need time to enlarge and mature so needles can be inserted for dialysis. Follow your caregiver's instructions about what you need to do to make this happen. °· Keep dressings clean and dry. °· Keep the arm elevated above your heart. Use a pillow. °· Rest. °· Use the arm as usual for all activities. °· Have the stitches or tape closures removed in 10 to 14 days, or as directed by your caregiver. °· Do not sleep or lie on the area of the fistula or that arm. This may decrease or stop the blood flow through your fistula. °· Do not allow blood pressures to be taken on this arm. °· Do not allow blood drawing to be done from the graft. °· Do not wear tight clothing around the access site or on the arm. °· Avoid lifting heavy objects with the arm that has the fistula. °· Do not use creams or lotions over the access site. °SEEK MEDICAL CARE IF:  °· You have a fever. °· You have swelling around the fistula that gets worse, or you have new pain. °· You have unusual bleeding at the fistula site or from any other area. °· You have pus or other drainage at the fistula site. °· You have skin  redness or red streaking on the skin around, above, or below the fistula site. °· Your access site feels warm. °· You have any flu-like symptoms. °SEEK IMMEDIATE MEDICAL CARE IF:  °· You have pain, numbness, or an unusual pale skin on the hand or on the side of your fistula. °· You have dizziness or weakness that you have not had before. °· You have shortness of breath. °· You have chest pain. °· Your fistula disconnects or breaks, and there is bleeding that cannot be easily controlled. °Call for local emergency medical help. Do not try to drive yourself to the hospital. °MAKE SURE YOU °· Understand these instructions. °· Will watch your condition. °· Will get help right away if you are not doing well or get worse. °Document Released: 05/11/2005 Document Revised: 08/03/2011 Document Reviewed: 10/29/2010 °ExitCare® Patient Information ©2014 [employer]. ° °

## 2013-08-29 ENCOUNTER — Other Ambulatory Visit (HOSPITAL_COMMUNITY): Payer: Medicare PPO

## 2013-08-29 ENCOUNTER — Encounter: Payer: Medicare PPO | Admitting: Vascular Surgery

## 2013-09-26 ENCOUNTER — Other Ambulatory Visit: Payer: Self-pay | Admitting: Physician Assistant

## 2013-10-13 ENCOUNTER — Encounter: Payer: Self-pay | Admitting: Vascular Surgery

## 2013-10-17 ENCOUNTER — Encounter: Payer: Self-pay | Admitting: Vascular Surgery

## 2013-10-17 ENCOUNTER — Ambulatory Visit (HOSPITAL_COMMUNITY)
Admission: RE | Admit: 2013-10-17 | Discharge: 2013-10-17 | Disposition: A | Discharge status: Home / Self Care | Payer: Medicare PPO | Source: Ambulatory Visit | Attending: Vascular Surgery | Admitting: Vascular Surgery

## 2013-10-17 ENCOUNTER — Ambulatory Visit (INDEPENDENT_AMBULATORY_CARE_PROVIDER_SITE_OTHER): Payer: Medicare PPO | Admitting: Vascular Surgery

## 2013-10-17 VITALS — BP 100/60 | HR 78 | Resp 14 | Ht 72.0 in | Wt 226.0 lb

## 2013-10-17 DIAGNOSIS — N186 End stage renal disease: Secondary | ICD-10-CM | POA: Insufficient documentation

## 2013-10-17 DIAGNOSIS — Z4931 Encounter for adequacy testing for hemodialysis: Secondary | ICD-10-CM | POA: Insufficient documentation

## 2013-10-17 NOTE — Progress Notes (Signed)
The patient presents today for continued followup of his left radiocephalic AV fistula created by myself on 04/17/2013. He did have some diminished flow to this and underwent venoplasty by Dr. Bridgett Larsson on 08/03/2013. He is here today for followup he reports that he is having stable renal function. He is not on hemodialysis.  On physical exam his fistula has an excellent thrill. This nicely dilated and runs of a very straight and superficial course  Venous duplex today was reviewed with the patient. This shows good size maturation throughout its course of the superficial location. There are some elevated velocities at the AV fistula and this is most likely related to angulation.  Impression and plan: Stable with a good maturation of his AV fistula. He will see [place] again on an as-needed basis. I feel he has an excellent chance of having excessive use of this fistula if he progresses to end-stage renal disease

## 2013-11-06 ENCOUNTER — Encounter: Payer: Self-pay | Admitting: Physician Assistant

## 2013-11-06 ENCOUNTER — Ambulatory Visit (INDEPENDENT_AMBULATORY_CARE_PROVIDER_SITE_OTHER): Payer: Medicare PPO | Admitting: Physician Assistant

## 2013-11-06 VITALS — BP 108/68 | HR 68 | Temp 97.0°F | Resp 18 | Wt 220.0 lb

## 2013-11-06 DIAGNOSIS — Q539 Undescended testicle, unspecified: Secondary | ICD-10-CM

## 2013-11-06 DIAGNOSIS — N186 End stage renal disease: Secondary | ICD-10-CM

## 2013-11-06 DIAGNOSIS — C61 Malignant neoplasm of prostate: Secondary | ICD-10-CM

## 2013-11-06 DIAGNOSIS — E875 Hyperkalemia: Secondary | ICD-10-CM

## 2013-11-06 DIAGNOSIS — I10 Essential (primary) hypertension: Secondary | ICD-10-CM

## 2013-11-06 DIAGNOSIS — E1159 Type 2 diabetes mellitus with other circulatory complications: Secondary | ICD-10-CM

## 2013-11-06 DIAGNOSIS — E559 Vitamin D deficiency, unspecified: Secondary | ICD-10-CM

## 2013-11-06 DIAGNOSIS — N189 Chronic kidney disease, unspecified: Secondary | ICD-10-CM

## 2013-11-06 LAB — COMPLETE METABOLIC PANEL WITH GFR
ALBUMIN: 4.2 g/dL (ref 3.5–5.2)
ALT: 12 U/L (ref 0–53)
AST: 16 U/L (ref 0–37)
Alkaline Phosphatase: 67 U/L (ref 39–117)
BILIRUBIN TOTAL: 0.4 mg/dL (ref 0.2–1.2)
BUN: 56 mg/dL — ABNORMAL HIGH (ref 6–23)
CALCIUM: 9.3 mg/dL (ref 8.4–10.5)
CHLORIDE: 109 meq/L (ref 96–112)
CO2: 25 meq/L (ref 19–32)
Creat: 2.9 mg/dL — ABNORMAL HIGH (ref 0.50–1.35)
GFR, EST AFRICAN AMERICAN: 24 mL/min — AB
GFR, Est Non African American: 21 mL/min — ABNORMAL LOW
GLUCOSE: 91 mg/dL (ref 70–99)
Potassium: 4.5 mEq/L (ref 3.5–5.3)
Sodium: 145 mEq/L (ref 135–145)
TOTAL PROTEIN: 7.6 g/dL (ref 6.0–8.3)

## 2013-11-06 LAB — HEMOGLOBIN A1C
Hgb A1c MFr Bld: 7.5 % — ABNORMAL HIGH (ref <5.7)
Mean Plasma Glucose: 169 mg/dL — ABNORMAL HIGH (ref <117)

## 2013-11-06 NOTE — Progress Notes (Signed)
Patient ID: Phillip Herring MRN: [id number], DOB: May 06, 1941, 73 y.o. Date of Encounter: @DATE @  Chief Complaint:  Chief Complaint  Patient presents with  . 3 month check up    is fasting   Patient's Home phone number--[phone] His sister--[phone]  HPI: 73 y.o. year old male  presents in routine follow up office visit.  He says that he has been feeling good. He says that he has made it to the beach to visit his friend who used to live here but has moved to the beach. He went to visit him several times recently while the weather has been nice but says he probably will not that much now the weather is getting so hot.   He has 4 brothers and 3 sisters who all live very close by. One of his sisters helps him manage his medicines and medical care.  He has continued to see Dr. Lorrene Reid regarding his chronic kidney disease. She sends me her office visit notes and lab results routinely.  His LOV with her was 10/02/2013. She did send me a copy of that office note.  He had left AV fistula done in 04/17/13. On followup there was a bruit and thrill but a pulsatile area and suggested of narrowing. He had followup fistulogram with PTA of a near occluded cephalic vein segment 7/56/43 with good results. He has remained stable with medications and has not required dialysis so far. After Lorrene Reid sees him for her office visits every 2 months. She has him on calcitriol, Kayexalate,  bicarbonate and diuretic.  Says his blood pressure has been running really good whenever he goes to appointments. Says he has quit eating bacon and sausage and is really careful with what he is eating now. Also has low potassium/renal diet. Also has been compliant with low carbohydrate diet.  Says that even with exertion he still has the chest pressure, heaviness, tightness, or increased shortness of breath.   He has been no sores on his foot. Today we discussed the fact that he had some problems with his foot last summer when  we get hot and sweaty. He tells me he has already thought about that and has planned to make sure he goes inside and airs out his foot frequently and does not let it get his sweaty and create sores.   He did not bring in a blood sugar log sheet. However he reports that his sugars continue to run good. Visit fasting morning sugars are always around 100. This is after a meal they may be around 120. Says he has had no hypoglycemia.   Past Medical History  Diagnosis Date  . NIDDM (non-insulin dependent diabetes mellitus)   . Hypertension   . Undescended testicle   . CKD (chronic kidney disease)   . Vitamin D deficiency   . Prostate cancer   . Chronic hyperkalemia 12/23/2012    Sees Renal  . Headache(784.0)      Home Meds: Outpatient Prescriptions Prior to Visit  Medication Sig Dispense Refill  . amLODipine (NORVASC) 10 MG tablet Take 10 mg by mouth daily.      Marland Kitchen aspirin 81 MG chewable tablet Chew 81 mg by mouth daily.      . calcitRIOL (ROCALTROL) 0.25 MCG capsule Take 0.25 mcg by mouth every Monday, Wednesday, and Friday.      . cholecalciferol (VITAMIN D) 1000 UNITS tablet Take 1,000 Units by mouth daily.      . finasteride (PROSCAR) 5 MG tablet Take  5 mg by mouth daily.        . furosemide (LASIX) 80 MG tablet Take 80 mg by mouth daily.       Marland Kitchen glipiZIDE (GLUCOTROL XL) 10 MG 24 hr tablet Take 10 mg by mouth daily with breakfast.      . nebivolol (BYSTOLIC) 5 MG tablet Take 1 tablet (5 mg total) by mouth daily.  30 tablet  5  . pioglitazone (ACTOS) 45 MG tablet Take 45 mg by mouth daily.      . sodium bicarbonate 650 MG tablet Take 650 mg by mouth 3 (three) times daily.       . sodium polystyrene (KAYEXALATE) 15 GM/60ML suspension Take 15 g by mouth 2 (two) times a week. *does on Monday and Friday      . pioglitazone (ACTOS) 45 MG tablet TAKE 1 TABLET (45 MG TOTAL) BY MOUTH DAILY.  30 tablet  3   No facility-administered medications prior to visit.     Allergies:  Allergies    Allergen Reactions  . Enalapril Other (See Comments)    Hyperkalemia.  . Metformin And Related Other (See Comments)    Renal insufficiency   . Tekturna [Aliskiren] Other (See Comments)    hyperkalemia    History   Social History  . Marital Status: Single    Spouse Name: N/A    Number of Children: N/A  . Years of Education: N/A   Occupational History  . Not on file.   Social History Main Topics  . Smoking status: Former Smoker -- 1.00 packs/day for 25 years    Types: Cigarettes    Quit date: 10/28/1982  . Smokeless tobacco: Never Used  . Alcohol Use: No  . Drug Use: No  . Sexual Activity: Not on file   Other Topics Concern  . Not on file   Social History Narrative  . No narrative on file    Family History  Problem Relation Age of Onset  . Cancer Sister      Review of Systems:  See HPI for pertinent ROS. All other ROS negative.    Physical Exam: Blood pressure 108/68, pulse 68, temperature 97 F (36.1 C), temperature source Oral, resp. rate 18, weight 220 lb (99.791 kg)., Body mass index is 29.83 kg/(m^2). General: WNWD WM. Appears in no acute distress.  Neck: Supple. No thyromegaly. No lymphadenopathy. no carotid bruits.  Lungs: Clear bilaterally to auscultation without wheezes, rales, or rhonchi. Breathing is unlabored. Heart: RRR with S1 S2. No murmurs, rubs, or gallops. Abdomen: Soft, non-tender, non-distended with normoactive bowel sounds. No hepatomegaly. No rebound/guarding. No obvious abdominal masses. Musculoskeletal:  Strength and tone normal for age. Extremities/Skin: Warm and dry. No clubbing or cyanosis. No edema. No rashes or suspicious lesions. Neuro: Alert and oriented X 3. Moves all extremities spontaneously. Gait is normal. CNII-XII grossly in tact. Psych:  Responds to questions appropriately with a normal affect.He is in very good spirits, as always!! 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 Left foot has good sensation. There are  no lesions or calluses or problem areas. He does have severe onychomycosis and has scaling and desquamation of the entire foot. His posterior tibial pulse is 2+ and bounding. However I cannot palpate a dorsalis pedis pulse.      ASSESSMENT AND PLAN:  73 y.o. year old male with   1. diabetes mellitus type 2 Recheck CMET and  A1c Diabetic nephropathy: Dr. Lorrene Reid is managing this. See #3 below. Diabetic retinopathy evaluation:  He sees Dr. Baird Cancer at the retina specialist routinely. Diabetic neuropathy: He does report that the problem is that of his left foot feels somewhat numb and tingly when he first stands up. However he states this improves when he walks around. I have offered him prescription medication to help the symptoms but he defers this again today.  No metformin secondary to kidney disease. No ACE inhibitor or ARB secondary to hyperkalemia/kidney disease.  2. Hypertension Blood pressure is at goal and well-controlled. No ACE Inh or ARB secondary to hyperkalemia.   3. CKD (chronic kidney disease)   Dr. Lorrene Reid has been sending me copies of her office notes and labs. Her last office note was dated 10/02/13. It Documents the following: Chronic kidney disease stage IV. Secondary to diabetes. Disease complications include: Hyperkalemia   Metabolic acidosis   Hyperparathyroidism. Medications that she prescribes:   furosemide  80 mg per day. Kayexalate twice a week. Sodium bicarbonate 3 times a day. Calcitriol 0.25 daily  He had renal ultrasound 11/07/2012 which was within normal limits. Slightly enlarged prostate gland. Normal appearing kidneys. Right 11.9 cm left 10.8 cm   She sent a copy of his labs dated 03/22/13. CBC was completely normal. Hemoglobin 12.5 hematocrit 37.4. BMET showed sodium 138 potassium 4.7. Chloride and CO2 were normal. Calcium was borderline low at 8.5. Phosphorus  And Albumin were normal. BUN 45    Creatinine 2.2        GFR 22. PTH was 128 which is  elevated.  Only lab that she sent me from her visit 07/28/2013 is a BMET which shows BUN 59   creatinine 2.72   potassium 4.7   . She made no changes in medications based on these labs. Plan for followup office visit in 2 months.  SHE ALSO SENT ME A COPY OF LABS DONE 05/26/13 WHICH INCLUDED CBC, BMET, PTH  Med copy of the labs done 10/02/13. CBC was the only lab done that day.   4. Chronic hyperkalemia Controlled and managed by Dr. Lorrene Reid as above.   5. End stage renal disease See #3 above. Has AV fistula. Has not yet required dialysis   6. H/O Prostate cancer   7.H/O Undescended testicle  8. Vitamin D deficiency Verified with patient he is now taking over-the-counter vitamin D 1000 units daily. This was [facility] at [facility] 9/14. He had been given prescription vitamin D after he had labs that were 2014. Did take that prescription weekly for 3 months. However when he completed the prescription he was taking no over-the-counter vitamin D until 9/14 office visit. Followup lab on 10/28/12 had shown vitamin D 33.   9. lipids: Presently his lipid profile has always been excellent. This is even without medication.  I repeated this again for followup on 10/28/12. Total cholesterol 158 triglycerides 271 HDL 34 LDL 70   10. 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 History of wounds on the left foot May 2012. Lower extremity arterial Dopplers May 2012 showed no evidence of arterial insufficiency.   12. Screening colonoscopy: Done 01/2010. Repeat 5 years secondary to poor prep.   13. Immunizations: Influenza vaccine: He received that at his office visit here 05/04/13 Pneumonia vaccine: He had Pneumovax in 2010. Up-dated with  Prevnar 13  At [facility] here 12/ 2014 Tetanus: Given in 2010 Zostavax: I discussed this with him 07/22/12 and again on 02/01/13.Today, again, he states He  still forgot to followup with checking with insurance. I wrote on his AVS at  LOV for him to call his insurance company and  find out the cause of this. If him about this again today but he says that he forgot all about it.   At [place] 10/2013 I am checking CMET and A1C. I will send a copy of office notes and the lab results and accurate medication list to Dr. Lorrene Reid. Her office note medicine list had an incorrect dose for Actos and Bystolic. I will have Kim contact Dr. Sanda Klein nurse.    Routine office visit with me in 3 months or sooner if needed.      Signed, [address] [place], [place], [facility] 11/06/2013 8:45 AM

## 2013-11-09 ENCOUNTER — Other Ambulatory Visit: Payer: Self-pay | Admitting: *Deleted

## 2013-11-09 MED ORDER — GLIPIZIDE ER 10 MG PO TB24: 20.0000 mg | ORAL_TABLET | Freq: Every day | ORAL | Status: DC

## 2014-01-13 ENCOUNTER — Other Ambulatory Visit: Payer: Self-pay | Admitting: Family Medicine

## 2014-01-13 NOTE — Telephone Encounter (Signed)
Refill appropriate and filled per protocol. 

## 2014-02-07 ENCOUNTER — Encounter: Payer: Self-pay | Admitting: Physician Assistant

## 2014-02-07 ENCOUNTER — Ambulatory Visit (INDEPENDENT_AMBULATORY_CARE_PROVIDER_SITE_OTHER): Payer: Medicare PPO | Admitting: Physician Assistant

## 2014-02-07 VITALS — BP 130/70 | HR 72 | Temp 98.3°F | Ht 72.0 in | Wt 219.0 lb

## 2014-02-07 DIAGNOSIS — I10 Essential (primary) hypertension: Secondary | ICD-10-CM

## 2014-02-07 DIAGNOSIS — N184 Chronic kidney disease, stage 4 (severe): Secondary | ICD-10-CM

## 2014-02-07 DIAGNOSIS — E559 Vitamin D deficiency, unspecified: Secondary | ICD-10-CM

## 2014-02-07 DIAGNOSIS — C61 Malignant neoplasm of prostate: Secondary | ICD-10-CM

## 2014-02-07 DIAGNOSIS — N186 End stage renal disease: Secondary | ICD-10-CM

## 2014-02-07 DIAGNOSIS — E875 Hyperkalemia: Secondary | ICD-10-CM

## 2014-02-07 DIAGNOSIS — E1159 Type 2 diabetes mellitus with other circulatory complications: Secondary | ICD-10-CM

## 2014-02-07 LAB — HEMOGLOBIN A1C, FINGERSTICK: HEMOGLOBIN A1C, FINGERSTICK: 8.5 % — AB (ref <5.7)

## 2014-02-07 MED ORDER — INSULIN DETEMIR 100 UNIT/ML ~~LOC~~ SOLN: 10.0000 [IU] | Freq: Every day | SUBCUTANEOUS | Status: DC

## 2014-02-07 NOTE — Progress Notes (Signed)
Patient ID: Phillip Herring MRN: [id number], DOB: 1940/07/23, 73 y.o. Date of Encounter: @DATE @  Chief Complaint:  Chief Complaint  Patient presents with  . Diabetes    3 momth f/u.   Patient's Home phone number--[phone] His sister--[phone]  HPI: 73 y.o. year old male  presents for routine follow up office visit.  He says that he has been feeling good.  At his visit 10/2013- he said that he has made it to the beach to visit his friend who used to live here but has moved to the beach. He went to visit him several times recently while the weather has been nice but says he probably will not that much now the weather is getting so hot.   He has 4 brothers and 3 sisters who all live very close by. One of his sisters helps him manage his medicines and medical care.  He has continued to see Dr. Lorrene Reid regarding his chronic kidney disease. She sends me her office visit notes and lab results routinely.  His LOV with her was 01/26/2014. She did send me a copy of that office note.  He had left AV fistula done in 04/17/13. On followup there was a bruit and thrill but a pulsatile area and suggested of narrowing. He had followup fistulogram with PTA of a near occluded cephalic vein segment 0/62/69 with good results. He has remained stable with medications and has not required dialysis so far. Dr. Lorrene Reid sees him for her office visits every 2 months. She has him on calcitriol,   bicarbonate and diuretic.  AT RECENT OV WITH ME, I HAD REALIZED THERE WERE SOME DESCEPANCIES IN THE MEDICATION LIST WE HAD COMPARED TO THE MEDICATION LIST ON DR. DUNHAM'S NOTES.  OUR OFFICE FOLLOWED UP WITH DR. DUNHAM'S OFFICE AND I THINK WE GOT THIS CORRECTED. ACCORDING TO THE LAB DONE BY DR. Lorrene Reid 01/26/2014 AND HER OV NOTE 01/26/2014--- HE IS STAYING OFF KAYEXELATE  (HE HAD ACCIDENTALLY STOPPED THIS BUT THEN LAB 01/2014 INDICATED THAT HE COULD STAY OFF THIS) ALSO AT DR.DUNHAM'S OV NOTE 01/2014--SHE REALIZED THAT SOMETIMES HIS  PHARMACY WAS DISPENSING CALCITRIOL 0.25 MCG INSTEAD OF CALCITRIOL 0.5 MCG.  TODAY--02/07/14--- I HAVE ADJUSTED OUR MEDICATION LIST---I REMOVED KAYEXELATE.           ----I CHANGED CALCITRIOL TO 0.5 MCG 1 PO QD  Says his blood pressure has been running really good whenever he goes to appointments. Says he has quit eating bacon and sausage and is really careful with what he is eating now. Also has low potassium/renal diet. Also has been compliant with low carbohydrate diet.  Says that even with exertion he still has the chest pressure, heaviness, tightness, or increased shortness of breath.   He has been no sores on his foot. At [place] 10/2013 we discussed the fact that he had some problems with his foot last summer when we get hot and sweaty. He tells me he has already thought about that and has planned to make sure he goes inside and airs out his foot frequently and does not let it get his sweaty and create sores.   He did not bring in a blood sugar log sheet. However he reports that his sugars continue to run good. Says fasting morning sugars are always around 100. This is after a meal they may be around 120. Says he has had no hypoglycemia. At lab 10/2013 A1C was 7.5 and my Result Note said to increase Glucotrol XL to 20mg  QD. He  says he knows nothing about this. Says he is still taking the 10mg . He brought all med bottles with him to OV today.   Past Medical History  Diagnosis Date  . NIDDM (non-insulin dependent diabetes mellitus)   . Hypertension   . Undescended testicle   . CKD (chronic kidney disease)   . Vitamin D deficiency   . Prostate cancer   . Chronic hyperkalemia 12/23/2012    Sees Renal  . Headache(784.0)      Home Meds: Outpatient Prescriptions Prior to Visit  Medication Sig Dispense Refill  . amLODipine (NORVASC) 10 MG tablet Take 10 mg by mouth daily.      Marland Kitchen aspirin 81 MG chewable tablet Chew 81 mg by mouth daily.      Marland Kitchen BYSTOLIC 5 MG tablet TAKE 1 TABLET BY MOUTH EVERY DAY   30 tablet  5  . finasteride (PROSCAR) 5 MG tablet Take 5 mg by mouth daily.        . furosemide (LASIX) 80 MG tablet Take 80 mg by mouth daily.       Marland Kitchen glipiZIDE (GLUCOTROL XL) 10 MG 24 hr tablet TAKE 1 TABLET (10 MG TOTAL) BY MOUTH DAILY.  30 tablet  5  . pioglitazone (ACTOS) 45 MG tablet Take 45 mg by mouth daily.      . sodium bicarbonate 650 MG tablet Take 650 mg by mouth 3 (three) times daily.       . calcitRIOL (ROCALTROL) 0.5 MCG capsule Take 0.5 mcg QD      . cholecalciferol (VITAMIN D) 1000 UNITS tablet Take 1,000 Units by mouth daily.      .         No facility-administered medications prior to visit.     Allergies:  Allergies  Allergen Reactions  . Enalapril Other (See Comments)    Hyperkalemia.  . Metformin And Related Other (See Comments)    Renal insufficiency   . Tekturna [Aliskiren] Other (See Comments)    hyperkalemia    History   Social History  . Marital Status: Single    Spouse Name: N/A    Number of Children: N/A  . Years of Education: N/A   Occupational History  . Not on file.   Social History Main Topics  . Smoking status: Former Smoker -- 1.00 packs/day for 25 years    Types: Cigarettes    Quit date: 10/28/1982  . Smokeless tobacco: Never Used  . Alcohol Use: No  . Drug Use: No  . Sexual Activity: Not on file   Other Topics Concern  . Not on file   Social History Narrative  . No narrative on file    Family History  Problem Relation Age of Onset  . Cancer Sister      Review of Systems:  See HPI for pertinent ROS. All other ROS negative.    Physical Exam: Blood pressure 130/70, pulse 72, temperature 98.3 F (36.8 C), temperature source Oral, height 6' (1.829 m), weight 219 lb (99.338 kg)., Body mass index is 29.7 kg/(m^2). General: WNWD WM. Appears in no acute distress.  Neck: Supple. No thyromegaly. No lymphadenopathy. no carotid bruits.  Lungs: Clear bilaterally to auscultation without wheezes, rales, or rhonchi. Breathing is  unlabored. Heart: RRR with S1 S2. No murmurs, rubs, or gallops. Abdomen: Soft, non-tender, non-distended with normoactive bowel sounds. No hepatomegaly. No rebound/guarding. No obvious abdominal masses. Musculoskeletal:  Strength and tone normal for age. Extremities/Skin: Warm and dry. No clubbing or cyanosis. No edema. No  rashes or suspicious lesions. Neuro: Alert and oriented X 3. Moves all extremities spontaneously. Gait is normal. CNII-XII grossly in tact. Psych:  Responds to questions appropriately with a normal affect.He is in very good spirits, as always!! 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 Left foot has good sensation. There are no lesions or calluses or problem areas. He does have severe onychomycosis and has scaling and desquamation of the entire foot. His posterior tibial pulse is 2+ and bounding. However I cannot palpate a dorsalis pedis pulse.       ASSESSMENT AND PLAN:  73 y.o. year old male with   1. diabetes mellitus type 2  BECAUSE THERE HAS BEEN PROBLEM WITH HIM TAKING MEDS AS DIRECTED, I HAD HIM WAIT HERE IN OFFICE FOR TODAY'S A1C RESULT SO I COULD DISCUSS IT WITH HIM FACE TO [place].   TODAY'S A1C IS 8.5 ---  I TOLD HIM TO STOP THE GLUCOTROL / GLIPIZIDE--I WROTE THIS ON HIS AVS I STARTED LEVEMIR AT 10 UNITS QHS---I GAVE HIM A SAMPLE AND ALSO SENT IN RX.   NURSE SHOWED HIM HOW TO USE THIS PEN AND HE VOICED UNDRSTANDING I GAVE HIM A BLOOD SUGAR LOG SHEET--TOLD HIM TO DOCUMENT FASTING MORNING BS EVERY MORNING AND ALSO DOCUMENT SOME OTHER BS READINGS AT OTHER TIMES OF DAY AS WELL.  SCHEDULE F/U OV WITH ME IN 1 WEEK. BRING BS LOG SHEET WITH HIM.   Diabetic nephropathy: Dr. Lorrene Reid is managing this. See #3 below. Diabetic retinopathy evaluation: He sees Dr. Baird Cancer at the retina specialist routinely. Diabetic neuropathy: He does report that the problem is that of his left foot feels somewhat numb and tingly when he first stands up. However he states this  improves when he walks around. I have offered him prescription medication to help the symptoms but he defers this again today.  No metformin secondary to kidney disease. No ACE inhibitor or ARB secondary to hyperkalemia/kidney disease.  2. Hypertension Blood pressure is at goal and well-controlled. No ACE Inh or ARB secondary to hyperkalemia.   3. CKD (chronic kidney disease)   Dr. Lorrene Reid has been sending me copies of her office notes and labs. Her last office note was dated 01/26/14. It Documents the following: Chronic kidney disease stage IV. Secondary to diabetes. Disease complications include: Hyperkalemia   Metabolic acidosis   Hyperparathyroidism. Medications that she prescribes:   furosemide  80 mg per day. Sodium bicarbonate 3 times a day. Calcitriol 0.50 daily  He had renal ultrasound 11/07/2012 which was within normal limits. Slightly enlarged prostate gland. Normal appearing kidneys. Right 11.9 cm left 10.8 cm    4. Chronic hyperkalemia Controlled and managed by Dr. Lorrene Reid as above.   5. End stage renal disease See #3 above. Has AV fistula. Has not yet required dialysis   6. H/O Prostate cancer   7.H/O Undescended testicle  8. Vitamin D deficiency Verified with patient he is now taking over-the-counter vitamin D 1000 units daily. This was started at [facility] 9/14. He had been given prescription vitamin D after he had labs that were 2014. Did take that prescription weekly for 3 months. However when he completed the prescription he was taking no over-the-counter vitamin D until 9/14 office visit. Followup lab on 10/28/12 had shown vitamin D 33.   9. lipids: Presently his lipid profile has always been excellent. This is even without medication.  I repeated this again for followup on 10/28/12. Total cholesterol 158 triglycerides 271 HDL 34 LDL 70  10. 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 History of wounds on the left foot May 2012. Lower extremity arterial  Dopplers May 2012 showed no evidence of arterial insufficiency.   12. Screening colonoscopy: Done 01/2010. Repeat 5 years secondary to poor prep.   13. Immunizations: Influenza vaccine: He received that at his office visit here 05/04/13 Pneumonia vaccine: He had Pneumovax in 2010. Up-dated with  Prevnar 13  At [place] here 12/ 2014 Tetanus: Given in 2010 Zostavax: I discussed this with him 07/22/12 and again on 02/01/13.Today, again, he states He  still forgot to followup with checking with insurance. I wrote on his AVS at [place] for him to call his insurance company and find out the cause of this. If him about this again today but he says that he forgot all about it.   TODAY--02/07/2014 I GAVE HIM 28 TABLETS OF BYSTOLIC SAMPLES , GAVE HIM ONE LEVEMIR PEN (ALL THAT WE HAD) WILL FAX DR. Lorrene Reid COPY OF TODAYS NOTE.  F/U OV ONE WEEK   Signed, [address] [place], [place], [facility] 02/07/2014 8:36 AM

## 2014-02-15 ENCOUNTER — Ambulatory Visit (INDEPENDENT_AMBULATORY_CARE_PROVIDER_SITE_OTHER): Payer: Medicare PPO | Admitting: Physician Assistant

## 2014-02-15 ENCOUNTER — Encounter: Payer: Self-pay | Admitting: Physician Assistant

## 2014-02-15 VITALS — BP 120/74 | HR 68 | Temp 98.2°F | Resp 18 | Wt 218.0 lb

## 2014-02-15 DIAGNOSIS — E1159 Type 2 diabetes mellitus with other circulatory complications: Secondary | ICD-10-CM

## 2014-02-15 DIAGNOSIS — N184 Chronic kidney disease, stage 4 (severe): Secondary | ICD-10-CM

## 2014-02-15 NOTE — Progress Notes (Signed)
Patient ID: EMIGDIO WILDEMAN MRN: [id number], DOB: 08-Apr-1941, 73 y.o. Date of Encounter: @DATE @  Chief Complaint:  Chief Complaint  Patient presents with  . one week follow up blood sugars   Patient's Home phone number--[phone] His sister--[phone]  HPI: 73 y.o. year old male  presents for 1 week f/u OV.  He was just seen for routine follow up office visit 02/07/2014. At that visit, I reviewed his last prior A1c and a result note attached to it. At lab 10/2013 A1C was 7.5 and my Result Note said to increase Glucotrol XL to 20mg  QD.At [place] 02/07/2014  he said that  he knew nothing about this. Said he was still taking the glipizide 10mg . He brought all med bottles with him to OV that day. This included glipizide.   At his office visit 02/07/2014, because there had been a problem with him getting lab results and instructions and taking medicines as directed,  I had him wait here in the office for that day's A1c results so that I could discuss it with him face-to-face. 02/07/2014 A1c was 8.5. At that time I told him to stop the Glucotrol/glipizide and also wrote this on his AVS. Started him on Levemir 10 units each bedtime.--Gave him a sample in the office in an Rx. Also the nurse showed him how to use the pen and he voiced understanding. I gave him a blood sugar log sheet and told him to document fasting morning readings every morning and also documents some other readings at other times a day. Told him to schedule followup office visit with what one week and bring the blood sugar log sheet with him.  Today--02/15/2014--patient states that he did stop the glipizide as directed. Patient states that he has been giving Levemir 10 units QHS  as directed. He says that he is having no difficulty doing these injections and is doing them correctly. He did bring in his blood sugar log sheet. All fasting readings are very consistent and they are all between 112-116. He only has a few other readings and these  or 2 hours after lunch and are very consistent at 126 and 127.   THE FOLLOWING IS COPIED FROM LOV NOTE, DATED 02/07/2014:  He says that he has been feeling good.  At his visit 10/2013- he said that he has made it to the beach to visit his friend who used to live here but has moved to the beach. He went to visit him several times recently while the weather has been nice but says he probably will not that much now the weather is getting so hot.   He has 4 brothers and 3 sisters who all live very close by. One of his sisters helps him manage his medicines and medical care.  He has continued to see Dr. Lorrene Reid regarding his chronic kidney disease. She sends me her office visit notes and lab results routinely.  His LOV with her was 01/26/2014. She did send me a copy of that office note.  He had left AV fistula done in 04/17/13. On followup there was a bruit and thrill but a pulsatile area and suggested of narrowing. He had followup fistulogram with PTA of a near occluded cephalic vein segment 1/76/16 with good results. He has remained stable with medications and has not required dialysis so far. Dr. Lorrene Reid sees him for her office visits every 2 months. She has him on calcitriol,   bicarbonate and diuretic.  AT RECENT OV WITH ME,  I HAD REALIZED THERE WERE SOME DESCEPANCIES IN THE MEDICATION LIST WE HAD COMPARED TO THE MEDICATION LIST ON DR. DUNHAM'S NOTES.  OUR OFFICE FOLLOWED UP WITH DR. DUNHAM'S OFFICE AND I THINK WE GOT THIS CORRECTED. ACCORDING TO THE LAB DONE BY DR. Lorrene Reid 01/26/2014 AND HER OV NOTE 01/26/2014--- HE IS STAYING OFF KAYEXELATE  (HE HAD ACCIDENTALLY STOPPED THIS BUT THEN LAB 01/2014 INDICATED THAT HE COULD STAY OFF THIS) ALSO AT DR.DUNHAM'S OV NOTE 01/2014--SHE REALIZED THAT SOMETIMES HIS PHARMACY WAS DISPENSING CALCITRIOL 0.25 MCG INSTEAD OF CALCITRIOL 0.5 MCG.  TODAY--02/07/14--- I HAVE ADJUSTED OUR MEDICATION LIST---I REMOVED KAYEXELATE.           ----I CHANGED CALCITRIOL TO 0.5 MCG 1  PO QD  Says his blood pressure has been running really good whenever he goes to appointments. Says he has quit eating bacon and sausage and is really careful with what he is eating now. Also has low potassium/renal diet. Also has been compliant with low carbohydrate diet.  Says that even with exertion he still has the chest pressure, heaviness, tightness, or increased shortness of breath.   He has been no sores on his foot. At [place] 10/2013 we discussed the fact that he had some problems with his foot last summer when we get hot and sweaty. He tells me he has already thought about that and has planned to make sure he goes inside and airs out his foot frequently and does not let it get his sweaty and create sores.   He did not bring in a blood sugar log sheet. However he reports that his sugars continue to run good. Says fasting morning sugars are always around 100. This is after a meal they may be around 120. Says he has had no hypoglycemia. At lab 10/2013 A1C was 7.5 and my Result Note said to increase Glucotrol XL to 20mg  QD. He says he knows nothing about this. Says he is still taking the 10mg . He brought all med bottles with him to OV that day.    Past Medical History  Diagnosis Date  . NIDDM (non-insulin dependent diabetes mellitus)   . Hypertension   . Undescended testicle   . CKD (chronic kidney disease)   . Vitamin D deficiency   . Prostate cancer   . Chronic hyperkalemia 12/23/2012    Sees Renal  . Headache(784.0)      Home Meds: Outpatient Prescriptions Prior to Visit  Medication Sig Dispense Refill  . amLODipine (NORVASC) 10 MG tablet Take 10 mg by mouth daily.      Marland Kitchen aspirin 81 MG chewable tablet Chew 81 mg by mouth daily.      Marland Kitchen BYSTOLIC 5 MG tablet TAKE 1 TABLET BY MOUTH EVERY DAY  30 tablet  5  . finasteride (PROSCAR) 5 MG tablet Take 5 mg by mouth daily.        . furosemide (LASIX) 80 MG tablet Take 80 mg by mouth daily.       Marland Kitchen glipiZIDE (GLUCOTROL XL) 10 MG 24 hr tablet  TAKE 1 TABLET (10 MG TOTAL) BY MOUTH DAILY.  30 tablet  5  . pioglitazone (ACTOS) 45 MG tablet Take 45 mg by mouth daily.      . sodium bicarbonate 650 MG tablet Take 650 mg by mouth 3 (three) times daily.       . calcitRIOL (ROCALTROL) 0.5 MCG capsule Take 0.5 mcg QD      . cholecalciferol (VITAMIN D) 1000 UNITS tablet Take 1,000 Units by  mouth daily.      .         No facility-administered medications prior to visit.     Allergies:  Allergies  Allergen Reactions  . Enalapril Other (See Comments)    Hyperkalemia.  . Metformin And Related Other (See Comments)    Renal insufficiency   . Tekturna [Aliskiren] Other (See Comments)    hyperkalemia    History   Social History  . Marital Status: Single    Spouse Name: N/A    Number of Children: N/A  . Years of Education: N/A   Occupational History  . Not on file.   Social History Main Topics  . Smoking status: Former Smoker -- 1.00 packs/day for 25 years    Types: Cigarettes    Quit date: 10/28/1982  . Smokeless tobacco: Never Used  . Alcohol Use: No  . Drug Use: No  . Sexual Activity: Not on file   Other Topics Concern  . Not on file   Social History Narrative  . No narrative on file    Family History  Problem Relation Age of Onset  . Cancer Sister      Review of Systems:  See HPI for pertinent ROS. All other ROS negative.    Physical Exam: Blood pressure 120/74, pulse 68, temperature 98.2 F (36.8 C), temperature source Oral, resp. rate 18, weight 218 lb (98.884 kg)., Body mass index is 29.56 kg/(m^2). General: WNWD WM. Appears in no acute distress.  Neck: Supple. No thyromegaly. No lymphadenopathy. no carotid bruits.  Lungs: Clear bilaterally to auscultation without wheezes, rales, or rhonchi. Breathing is unlabored. Heart: RRR with S1 S2. No murmurs, rubs, or gallops. Abdomen: Soft, non-tender, non-distended with normoactive bowel sounds. No hepatomegaly. No rebound/guarding. No obvious abdominal  masses. Musculoskeletal:  Strength and tone normal for age. Extremities/Skin: Warm and dry. No clubbing or cyanosis. No edema. No rashes or suspicious lesions. Neuro: Alert and oriented X 3. Moves all extremities spontaneously. Gait is normal. CNII-XII grossly in tact. Psych:  Responds to questions appropriately with a normal affect.He is in very good spirits, as always!! 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 Left foot has good sensation. There are no lesions or calluses or problem areas. He does have severe onychomycosis and has scaling and desquamation of the entire foot. His posterior tibial pulse is 2+ and bounding. However I cannot palpate a dorsalis pedis pulse.       ASSESSMENT AND PLAN:  73 y.o. year old male with   1. diabetes mellitus type 2  Continue the Levemir at 10 units QHS. Continue all medications the same. Call our office if he has any questions or concerns or if starts having any low blood sugar readings. Today I did discuss with him to make sure to avoid hypoglycemia. Told him to make sure he eats on a routine/regular basis. If he ever has a GI virus that causes anorexia, nausea, vomiting/diarrhea, followup with [place] so we can help him adjust his insulin.  Diabetic nephropathy: Dr. Lorrene Reid is managing this. See #3 below. Diabetic retinopathy evaluation: He sees Dr. Baird Cancer at the retina specialist routinely. Diabetic neuropathy: He does report that the problem is that of his left foot feels somewhat numb and tingly when he first stands up. However he states this improves when he walks around. I have offered him prescription medication to help the symptoms but he defers this again today.  No metformin secondary to kidney disease. No ACE inhibitor or ARB secondary  to hyperkalemia/kidney disease.  2. Hypertension Blood pressure is at goal and well-controlled. No ACE Inh or ARB secondary to hyperkalemia.   3. CKD (chronic kidney disease)   Dr. Lorrene Reid has  been sending me copies of her office notes and labs. Her last office note was dated 01/26/14. It Documents the following: Chronic kidney disease stage IV. Secondary to diabetes. Disease complications include: Hyperkalemia   Metabolic acidosis   Hyperparathyroidism. Medications that she prescribes:   furosemide  80 mg per day. Sodium bicarbonate 3 times a day. Calcitriol 0.50 daily  He had renal ultrasound 11/07/2012 which was within normal limits. Slightly enlarged prostate gland. Normal appearing kidneys. Right 11.9 cm left 10.8 cm    4. Chronic hyperkalemia Controlled and managed by Dr. Lorrene Reid as above.   5. End stage renal disease See #3 above. Has AV fistula. Has not yet required dialysis   6. H/O Prostate cancer   7.H/O Undescended testicle  8. Vitamin D deficiency Verified with patient he is now taking over-the-counter vitamin D 1000 units daily. This was started at [facility] 9/14. He had been given prescription vitamin D after he had labs that were 2014. Did take that prescription weekly for 3 months. However when he completed the prescription he was taking no over-the-counter vitamin D until 9/14 office visit. Followup lab on 10/28/12 had shown vitamin D 33.   9. lipids: Presently his lipid profile has always been excellent. This is even without medication.  I repeated this again for followup on 10/28/12. Total cholesterol 158 triglycerides 271 HDL 34 LDL 70   10. 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 History of wounds on the left foot May 2012. Lower extremity arterial Dopplers May 2012 showed no evidence of arterial insufficiency.   12. Screening colonoscopy: Done 01/2010. Repeat 5 years secondary to poor prep.   13. Immunizations: Influenza vaccine: He received that at his office visit here 05/04/13 Pneumonia vaccine: He had Pneumovax in 2010. Up-dated with  Prevnar 13  At [place] here 12/ 2014 Tetanus: Given in 2010 Zostavax: I discussed this with him 07/22/12 and  again on 02/01/13.Today, again, he states He  still forgot to followup with checking with insurance. I wrote on his AVS at [place] for him to call his insurance company and find out the cause of this. If him about this again today but he says that he forgot all about it.  Follow up for routine office visit in 3 months. Followup sooner if needed. Today I did give him 2 more samples of Levemir Pen.   Signed, [address] [place], [place], [facility] 02/15/2014 9:22 AM

## 2014-03-13 ENCOUNTER — Other Ambulatory Visit: Payer: Self-pay | Admitting: Family Medicine

## 2014-03-13 NOTE — Telephone Encounter (Signed)
Refill appropriate and filled per protocol. 

## 2014-03-30 ENCOUNTER — Other Ambulatory Visit: Payer: Self-pay | Admitting: Family Medicine

## 2014-03-30 MED ORDER — "INSULIN SYRINGE-NEEDLE U-100 31G X 15/64"" 1 ML MISC": 1.0000 | Freq: Every day | Status: DC

## 2014-03-30 NOTE — Telephone Encounter (Signed)
Insulin syringes refilled for one year

## 2014-04-23 ENCOUNTER — Ambulatory Visit (INDEPENDENT_AMBULATORY_CARE_PROVIDER_SITE_OTHER): Payer: Medicare PPO | Admitting: Physician Assistant

## 2014-04-23 ENCOUNTER — Encounter: Payer: Self-pay | Admitting: Physician Assistant

## 2014-04-23 VITALS — BP 132/70 | HR 80 | Temp 99.0°F | Resp 18 | Wt 222.0 lb

## 2014-04-23 DIAGNOSIS — N186 End stage renal disease: Secondary | ICD-10-CM

## 2014-04-23 DIAGNOSIS — E118 Type 2 diabetes mellitus with unspecified complications: Secondary | ICD-10-CM

## 2014-04-23 DIAGNOSIS — S98911A Complete traumatic amputation of right foot, level unspecified, initial encounter: Secondary | ICD-10-CM | POA: Insufficient documentation

## 2014-04-23 DIAGNOSIS — L97519 Non-pressure chronic ulcer of other part of right foot with unspecified severity: Secondary | ICD-10-CM

## 2014-04-23 DIAGNOSIS — E08621 Diabetes mellitus due to underlying condition with foot ulcer: Secondary | ICD-10-CM

## 2014-04-23 DIAGNOSIS — S98911S Complete traumatic amputation of right foot, level unspecified, sequela: Secondary | ICD-10-CM

## 2014-04-23 MED ORDER — CEPHALEXIN 500 MG PO CAPS: 500.0000 mg | ORAL_CAPSULE | Freq: Four times a day (QID) | ORAL | Status: DC

## 2014-04-23 NOTE — Progress Notes (Signed)
Patient ID: Phillip Herring MRN: [id number], DOB: April 12, 1941, 73 y.o. Date of Encounter: @DATE @  Chief Complaint:  Chief Complaint  Patient presents with  . infection heel of rt foot    few days    HPI: 73 y.o. year old white male  presents with above complaint.  He has a history of a Right Pirogoff Amputation. Performed 01/2009 by Dr. Sharol Given.   He had some problems with some sores on his left foot in 11/2011. Those were treated here and also at the wound center. Patient says that he did not have to see Dr.Duda regarding those wounds at that time.  Since 11/2011 - 12/2011, he has had no problems with any wounds of his feet.  He says that he went to visit his friend at the beach November 18 and just returned from the beach this past Saturday 04/21/14. He says that it was probably about 1 week after he got to the beach that he started noticing a sore area at the heel/stump on the right. He says that they started applying Neosporin to it. Says that he was having no pain in the area until  on Saturday --when he returned from the beach--when he was walking, he began to notice pain at the site. He says that the site is worsening and is worse now than it had been while at the beach.   Past Medical History  Diagnosis Date  . NIDDM (non-insulin dependent diabetes mellitus)   . Hypertension   . Undescended testicle   . CKD (chronic kidney disease)   . Vitamin D deficiency   . Prostate cancer   . Chronic hyperkalemia 12/23/2012    Sees Renal  . Headache(784.0)      Home Meds: Outpatient Prescriptions Prior to Visit  Medication Sig Dispense Refill  . amLODipine (NORVASC) 10 MG tablet TAKE 1 TABLET (10 MG TOTAL) BY MOUTH DAILY. 30 tablet 5  . aspirin 81 MG chewable tablet Chew 81 mg by mouth daily.    Marland Kitchen BYSTOLIC 5 MG tablet TAKE 1 TABLET BY MOUTH EVERY DAY 30 tablet 5  . calcitRIOL (ROCALTROL) 0.5 MCG capsule Take 0.5 mcg by mouth daily.    . cholecalciferol (VITAMIN D) 1000 UNITS tablet  Take 1,000 Units by mouth daily.    . finasteride (PROSCAR) 5 MG tablet Take 5 mg by mouth daily.      . furosemide (LASIX) 80 MG tablet Take 80 mg by mouth daily.     . insulin detemir (LEVEMIR) 100 UNIT/ML injection Inject 0.1 mLs (10 Units total) into the skin at bedtime. 10 mL 3  . Insulin Syringe-Needle U-100 (BD INSULIN SYRINGE ULTRAFINE) 31G X 15/64" 1 ML MISC 1 each by Does not apply route daily. 100 each 6  . pioglitazone (ACTOS) 45 MG tablet Take 45 mg by mouth daily.    . sodium bicarbonate 650 MG tablet Take 650 mg by mouth 3 (three) times daily.      No facility-administered medications prior to visit.    Allergies:  Allergies  Allergen Reactions  . Enalapril Other (See Comments)    Hyperkalemia.  . Metformin And Related Other (See Comments)    Renal insufficiency   . Tekturna [Aliskiren] Other (See Comments)    hyperkalemia    History   Social History  . Marital Status: Single    Spouse Name: N/A    Number of Children: N/A  . Years of Education: N/A   Occupational History  . Not on  file.   Social History Main Topics  . Smoking status: Former Smoker -- 1.00 packs/day for 25 years    Types: Cigarettes    Quit date: 10/28/1982  . Smokeless tobacco: Never Used  . Alcohol Use: No  . Drug Use: No  . Sexual Activity: Not on file   Other Topics Concern  . Not on file   Social History Narrative    Family History  Problem Relation Age of Onset  . Cancer Sister      Review of Systems:  See HPI for pertinent ROS. All other ROS negative.    Physical Exam: Blood pressure 132/70, pulse 80, temperature 99 F (37.2 C), temperature source Oral, resp. rate 18, weight 222 lb (100.699 kg)., Body mass index is 30.1 kg/(m^2). General: WNWD WM. Appears in no acute distress. Lungs: Clear bilaterally to auscultation without wheezes, rales, or rhonchi. Breathing is unlabored. Heart: Reg Rhythm.  Musculoskeletal:  Strength and tone normal for  age. Extremities/Skin: Right --H/O Pirgoff Amputation--all of foot removed except the heel. The heel has  5cm diamtere area that is white colored ulcer.  In the center of this, there is a 3cm area that is open wound/ulcer There is surrounding cellulitis. The cellulitis spreads to the lateral aspect of the "stump", to the front of the "stump". No cellulitis spreads to the medial aspect.   Neuro: Alert and oriented X 3. Moves all extremities spontaneously. Gait is normal. CNII-XII grossly in tact. Psych:  Responds to questions appropriately with a normal affect.     ASSESSMENT AND PLAN:  73 y.o. year old male with  1. Diabetic ulcer of right foot associated with diabetes mellitus due to underlying condition - cephALEXin (KEFLEX) 500 MG capsule; Take 1 capsule (500 mg total) by mouth 4 (four) times daily.  Dispense: 40 capsule; Refill: 0 - AMB referral to wound care center  2. Diabetes mellitus type 2 with complications  3. Complete amputation of right foot, sequela  4. End stage renal disease  He is to go pick up the Keflex and start taking it now. Is to take all 4 doses today and continued continue getting in 4 doses per day. We are applying an Haematologist here in our office today. I have spoken to our referral nurse and she is working on referral to the wound center to get him in as soon as possible. If they can see him in the next couple of days, then he will simply follow up there. If the wound center cannot see him that soon, then I will schedule for him to come here for follow-up in the interim until he can get into the wound center. I will be following up with the referral nurse as well as the patient later today to make sure the proper follow-up is arranged.   Signed, [address] [place], [place], [facility] 04/23/2014 10:18 AM

## 2014-04-30 ENCOUNTER — Other Ambulatory Visit: Payer: Self-pay | Admitting: *Deleted

## 2014-04-30 DIAGNOSIS — I83019 Varicose veins of right lower extremity with ulcer of unspecified site: Secondary | ICD-10-CM

## 2014-04-30 DIAGNOSIS — L97919 Non-pressure chronic ulcer of unspecified part of right lower leg with unspecified severity: Principal | ICD-10-CM

## 2014-05-01 ENCOUNTER — Encounter: Payer: Self-pay | Admitting: Surgery

## 2014-05-02 ENCOUNTER — Ambulatory Visit (HOSPITAL_COMMUNITY)
Admission: RE | Admit: 2014-05-02 | Discharge: 2014-05-02 | Disposition: A | Discharge status: Home / Self Care | Payer: Medicare PPO | Source: Ambulatory Visit | Attending: Surgery | Admitting: Surgery

## 2014-05-02 ENCOUNTER — Ambulatory Visit (INDEPENDENT_AMBULATORY_CARE_PROVIDER_SITE_OTHER): Payer: Medicare PPO | Admitting: Surgery

## 2014-05-02 ENCOUNTER — Encounter: Payer: Self-pay | Admitting: Surgery

## 2014-05-02 VITALS — BP 107/63 | HR 70 | Resp 18 | Ht 72.0 in | Wt 218.0 lb

## 2014-05-02 DIAGNOSIS — I83019 Varicose veins of right lower extremity with ulcer of unspecified site: Secondary | ICD-10-CM

## 2014-05-02 DIAGNOSIS — I70234 Atherosclerosis of native arteries of right leg with ulceration of heel and midfoot: Secondary | ICD-10-CM

## 2014-05-02 DIAGNOSIS — I83012 Varicose veins of right lower extremity with ulcer of calf: Secondary | ICD-10-CM

## 2014-05-02 DIAGNOSIS — I83011 Varicose veins of right lower extremity with ulcer of thigh: Secondary | ICD-10-CM | POA: Diagnosis not present

## 2014-05-02 DIAGNOSIS — I83018 Varicose veins of right lower extremity with ulcer other part of lower leg: Secondary | ICD-10-CM

## 2014-05-02 DIAGNOSIS — I83014 Varicose veins of right lower extremity with ulcer of heel and midfoot: Secondary | ICD-10-CM

## 2014-05-02 DIAGNOSIS — I83015 Varicose veins of right lower extremity with ulcer other part of foot: Secondary | ICD-10-CM

## 2014-05-02 DIAGNOSIS — I83013 Varicose veins of right lower extremity with ulcer of ankle: Secondary | ICD-10-CM | POA: Diagnosis not present

## 2014-05-02 DIAGNOSIS — L97919 Non-pressure chronic ulcer of unspecified part of right lower leg with unspecified severity: Secondary | ICD-10-CM

## 2014-05-02 DIAGNOSIS — I70269 Atherosclerosis of native arteries of extremities with gangrene, unspecified extremity: Secondary | ICD-10-CM

## 2014-05-02 NOTE — Progress Notes (Signed)
Patient name: Phillip Herring MRN: [id number] DOB: 1940/10/16 Sex: male   Referred by: Dr. Elby Showers  Reason for referral:  Chief Complaint  Patient presents with  . New Evaluation    right foot wound    HISTORY OF PRESENT ILLNESS: This is a 73 year old gentleman who comes in today for evaluation of a right heel ulcer.  The patient states that it has been present for approximately 3 weeks.  He does state that it has been improving.  He has a history of a Syme amputation by Dr. Sharol Given and 2010.  He walks with a special prosthesis.  He denies any fevers or chills.  He has been applying Neosporin to the area.  The patient has stage IV chronic kidney disease and has recently undergone left radiocephalic fistula which has matured nicely.  He is medically managed for hypertension.  His cholesterol profile shows an LDL less than 70.  Past Medical History  Diagnosis Date  . NIDDM (non-insulin dependent diabetes mellitus)   . Hypertension   . Undescended testicle   . CKD (chronic kidney disease)   . Vitamin D deficiency   . Prostate cancer   . Chronic hyperkalemia 12/23/2012    Sees Renal  . YOVZCHYI(502.7)     Past Surgical History  Procedure Laterality Date  . Foot amputation    . Eye surgery Bilateral     laser  . Av fistula placement Left 04/17/2013    Procedure: ARTERIOVENOUS (AV) FISTULA CREATION- LEFT RADIOCEPHALIC;  Surgeon: Rosetta Posner, MD;  Location: [facility];  Service: Vascular;  Laterality: Left;    History   Social History  . Marital Status: Single    Spouse Name: N/A    Number of Children: N/A  . Years of Education: N/A   Occupational History  . Not on file.   Social History Main Topics  . Smoking status: Former Smoker -- 1.00 packs/day for 25 years    Types: Cigarettes    Quit date: 10/28/1982  . Smokeless tobacco: Never Used  . Alcohol Use: No  . Drug Use: No  . Sexual Activity: Not on file   Other Topics Concern  . Not on file   Social History  Narrative    Family History  Problem Relation Age of Onset  . Cancer Sister     Allergies as of 05/02/2014 - Review Complete 05/02/2014  Allergen Reaction Noted  . Enalapril Other (See Comments) 11/25/2010  . Metformin and related Other (See Comments) 07/29/2012  . Tekturna [aliskiren] Other (See Comments) 10/27/2012    Current Outpatient Prescriptions on File Prior to Visit  Medication Sig Dispense Refill  . amLODipine (NORVASC) 10 MG tablet TAKE 1 TABLET (10 MG TOTAL) BY MOUTH DAILY. 30 tablet 5  . aspirin 81 MG chewable tablet Chew 81 mg by mouth daily.    . B-D INS SYR ULTRAFINE 1CC/31G 31G X 5/16" 1 ML MISC daily. for testing  99  . BYSTOLIC 5 MG tablet TAKE 1 TABLET BY MOUTH EVERY DAY 30 tablet 5  . calcitRIOL (ROCALTROL) 0.5 MCG capsule Take 0.5 mcg by mouth daily.    . cephALEXin (KEFLEX) 500 MG capsule Take 1 capsule (500 mg total) by mouth 4 (four) times daily. 40 capsule 0  . cholecalciferol (VITAMIN D) 1000 UNITS tablet Take 1,000 Units by mouth daily.    . finasteride (PROSCAR) 5 MG tablet Take 5 mg by mouth daily.      . furosemide (LASIX) 80 MG tablet  Take 80 mg by mouth daily.     . insulin detemir (LEVEMIR) 100 UNIT/ML injection Inject 0.1 mLs (10 Units total) into the skin at bedtime. 10 mL 3  . Insulin Syringe-Needle U-100 (BD INSULIN SYRINGE ULTRAFINE) 31G X 15/64" 1 ML MISC 1 each by Does not apply route daily. 100 each 6  . pioglitazone (ACTOS) 45 MG tablet Take 45 mg by mouth daily.    . sodium bicarbonate 650 MG tablet Take 650 mg by mouth 3 (three) times daily.      No current facility-administered medications on file prior to visit.     REVIEW OF SYSTEMS: Cardiovascular: No chest pain, chest pressure, palpitations, orthopnea, or dyspnea on exertion. No claudication or rest pain,  No history of DVT or phlebitis. Pulmonary: No productive cough, asthma or wheezing. Neurologic: No weakness, paresthesias, aphasia, or amaurosis. No dizziness. Hematologic: No  bleeding problems or clotting disorders. Musculoskeletal: No joint pain or joint swelling. Gastrointestinal: No blood in stool or hematemesis Genitourinary: No dysuria or hematuria. Psychiatric:: No history of major depression. Integumentary: No rashes or ulcers. Constitutional: No fever or chills.  PHYSICAL EXAMINATION: General: The patient appears their stated age.  Vital signs are BP 107/63 mmHg  Pulse 70  Resp 18  Ht 6' (1.829 m)  Wt 218 lb (98.884 kg)  BMI 29.56 kg/m2 HEENT:  No gross abnormalities Pulmonary: Respirations are non-labored Abdomen: Soft and non-tender  Musculoskeletal: There are no major deformities.   Neurologic: No focal weakness or paresthesias are detected, Skin: 2 cm ulceration to the right heel.  No significant erythema or drainage Psychiatric: The patient has normal affect. Cardiovascular: There is a regular rate and rhythm without significant murmur appreciated.  Diagnostic Studies: ABIs been ordered and reviewed.  On the left is 1.1 with triphasic waveforms.  The right is 1.09 with biphasic waveforms   Assessment:  Atherosclerosis with ulceration, right heel Plan: Based on the location of the ulceration, I'm concerned that this is pressure related either to walking or with his existing prosthesis.  After reviewing his vascular studies, he should have adequate blood flow to get this area to heal as long as he can have appropriate pressure offloading.  In addition, he has significant renal disease which could be made worse if we directly proceed with angiography.  For that reason I am going to refer him back to Dr. Sharol Given to see if a walking cast or offloading mechanism can be created to see if we can get this to heal with just wound care.  The patient will follow-up with me in one month.  If the wound has not healed or looks worse, I would proceed with angiography.  If the wound has healed or has nearly healed I have told the patient to cancel his visit with  me.     Eldridge Abrahams, M.D. [facility] Office: [phone] Pager:  [phone]

## 2014-05-02 NOTE — Addendum Note (Signed)
Addended by: Mena Goes on: 05/02/2014 01:52 PM   Modules accepted: Orders

## 2014-05-03 ENCOUNTER — Encounter (HOSPITAL_COMMUNITY): Payer: Self-pay | Admitting: Vascular Surgery

## 2014-05-04 ENCOUNTER — Encounter (HOSPITAL_BASED_OUTPATIENT_CLINIC_OR_DEPARTMENT_OTHER): Payer: Medicare PPO | Attending: Internal Medicine

## 2014-05-14 ENCOUNTER — Other Ambulatory Visit (HOSPITAL_COMMUNITY): Payer: Self-pay | Admitting: Orthopedic Surgery

## 2014-05-16 ENCOUNTER — Ambulatory Visit (INDEPENDENT_AMBULATORY_CARE_PROVIDER_SITE_OTHER): Payer: Medicare PPO | Admitting: Physician Assistant

## 2014-05-16 ENCOUNTER — Encounter: Payer: Self-pay | Admitting: Physician Assistant

## 2014-05-16 VITALS — BP 118/64 | HR 60 | Temp 98.1°F | Resp 18 | Wt 214.0 lb

## 2014-05-16 DIAGNOSIS — N186 End stage renal disease: Secondary | ICD-10-CM

## 2014-05-16 DIAGNOSIS — Q5310 Unspecified undescended testicle, unilateral: Secondary | ICD-10-CM

## 2014-05-16 DIAGNOSIS — N184 Chronic kidney disease, stage 4 (severe): Secondary | ICD-10-CM

## 2014-05-16 DIAGNOSIS — E559 Vitamin D deficiency, unspecified: Secondary | ICD-10-CM

## 2014-05-16 DIAGNOSIS — E875 Hyperkalemia: Secondary | ICD-10-CM

## 2014-05-16 DIAGNOSIS — Z23 Encounter for immunization: Secondary | ICD-10-CM

## 2014-05-16 DIAGNOSIS — S98911S Complete traumatic amputation of right foot, level unspecified, sequela: Secondary | ICD-10-CM

## 2014-05-16 DIAGNOSIS — I10 Essential (primary) hypertension: Secondary | ICD-10-CM

## 2014-05-16 DIAGNOSIS — E118 Type 2 diabetes mellitus with unspecified complications: Secondary | ICD-10-CM

## 2014-05-16 DIAGNOSIS — C61 Malignant neoplasm of prostate: Secondary | ICD-10-CM

## 2014-05-16 LAB — COMPLETE METABOLIC PANEL WITH GFR
ALT: 8 U/L (ref 0–53)
AST: 12 U/L (ref 0–37)
Albumin: 3.7 g/dL (ref 3.5–5.2)
Alkaline Phosphatase: 47 U/L (ref 39–117)
BUN: 57 mg/dL — ABNORMAL HIGH (ref 6–23)
CALCIUM: 9.1 mg/dL (ref 8.4–10.5)
CHLORIDE: 101 meq/L (ref 96–112)
CO2: 26 meq/L (ref 19–32)
CREATININE: 2.89 mg/dL — AB (ref 0.50–1.35)
GFR, EST AFRICAN AMERICAN: 24 mL/min — AB
GFR, EST NON AFRICAN AMERICAN: 21 mL/min — AB
GLUCOSE: 137 mg/dL — AB (ref 70–99)
Potassium: 4.3 mEq/L (ref 3.5–5.3)
Sodium: 139 mEq/L (ref 135–145)
Total Bilirubin: 0.4 mg/dL (ref 0.2–1.2)
Total Protein: 7.7 g/dL (ref 6.0–8.3)

## 2014-05-16 LAB — HEMOGLOBIN A1C
HEMOGLOBIN A1C: 8.8 % — AB (ref <5.7)
Mean Plasma Glucose: 206 mg/dL — ABNORMAL HIGH (ref <117)

## 2014-05-16 NOTE — Progress Notes (Signed)
Patient ID: Phillip Herring MRN: [id number], DOB: Oct 09, 1940, 73 y.o. Date of Encounter: @DATE @  Chief Complaint:  Chief Complaint  Patient presents with  . 3 mth check up    is fasting   Patient's Home phone number--[phone] His sister--[phone]  HPI: 73 y.o. year old male  presents for routine f/u OV.   He saw me for routine follow up office visit 02/07/2014. At that visit, I reviewed his last prior A1c and a result note attached to it. At lab 10/2013 A1C was 7.5 and my Result Note said to increase Glucotrol XL to 20mg  QD.At [place] 02/07/2014  he said that  he knew nothing about this. Said he was still taking the glipizide 10mg . He brought all med bottles with him to OV that day. This included glipizide.   At his office visit 02/07/2014, because there had been a problem with him getting lab results and instructions and taking medicines as directed,  I had him wait here in the office for that day's A1c results so that I could discuss it with him face-to-face. 02/07/2014 A1c was 8.5. At that time I told him to stop the Glucotrol/glipizide and also wrote this on his AVS. Started him on Levemir 10 units each bedtime.--Gave him a sample in the office in an Rx. Also the nurse showed him how to use the pen and he voiced understanding. I gave him a blood sugar log sheet and told him to document fasting morning readings every morning and also documents some other readings at other times a day. Told him to schedule followup office visit with what one week and bring the blood sugar log sheet with him.  He did have f/u OV with me--02/15/2014--patient stated that he did stop the glipizide as directed. Patient stated that he has been giving Levemir 10 units QHS  as directed. He said that he is having no difficulty doing these injections and is doing them correctly. He did bring in his blood sugar log sheet. All fasting readings are very consistent and they are all between 112-116. He only has a few other  readings and these or 2 hours after lunch and are very consistent at 126 and 127.  He had OV with me 04/23/2014 regarding ulcer on Right Foot.  Today he reports that the infection is down in the bone of the ankle joint. (Osteomyelitis) He says that he is scheduled to have surgery on May 30, 2014 for above the ankle/below knee amputation. Says he is seeing Dr. Sharol Given. Says that he will go to rehab after the surgery.  He says that his blood sugars are doing great since we added the insulin. Says that he is still getting readings similar to those listed above which were fasting between 112-116 and 2 hours postprandial around 126.  Says that he was going to an eye check in January with Dr. Baird Cancer but now that we'll have to wait until after his foot surgery.  No other complaints or concerns.   THE FOLLOWING IS COPIED FROM LOV NOTE, DATED 02/07/2014:  He says that he has been feeling good.  At his visit 10/2013- he said that he has made it to the beach to visit his friend who used to live here but has moved to the beach. He went to visit him several times recently while the weather has been nice but says he probably will not that much now the weather is getting so hot.   He has 4 brothers and  3 sisters who all live very close by. One of his sisters helps him manage his medicines and medical care.  He has continued to see Dr. Lorrene Reid regarding his chronic kidney disease. She sends me her office visit notes and lab results routinely.  His LOV with her was 01/26/2014. She did send me a copy of that office note.  He had left AV fistula done in 04/17/13. On followup there was a bruit and thrill but a pulsatile area and suggested of narrowing. He had followup fistulogram with PTA of a near occluded cephalic vein segment 2/35/57 with good results. He has remained stable with medications and has not required dialysis so far. Dr. Lorrene Reid sees him for her office visits every 2 months. She has him on  calcitriol,   bicarbonate and diuretic.  AT RECENT OV WITH ME, I HAD REALIZED THERE WERE SOME DESCEPANCIES IN THE MEDICATION LIST WE HAD COMPARED TO THE MEDICATION LIST ON DR. DUNHAM'S NOTES.  OUR OFFICE FOLLOWED UP WITH DR. DUNHAM'S OFFICE AND I THINK WE GOT THIS CORRECTED. ACCORDING TO THE LAB DONE BY DR. Lorrene Reid 01/26/2014 AND HER OV NOTE 01/26/2014--- HE IS STAYING OFF KAYEXELATE  (HE HAD ACCIDENTALLY STOPPED THIS BUT THEN LAB 01/2014 INDICATED THAT HE COULD STAY OFF THIS) ALSO AT DR.DUNHAM'S OV NOTE 01/2014--SHE REALIZED THAT SOMETIMES HIS PHARMACY WAS DISPENSING CALCITRIOL 0.25 MCG INSTEAD OF CALCITRIOL 0.5 MCG.  TODAY--02/07/14--- I HAVE ADJUSTED OUR MEDICATION LIST---I REMOVED KAYEXELATE.           ----I CHANGED CALCITRIOL TO 0.5 MCG 1 PO QD  Says his blood pressure has been running really good whenever he goes to appointments. Says he has quit eating bacon and sausage and is really careful with what he is eating now. Also has low potassium/renal diet. Also has been compliant with low carbohydrate diet.  Says that even with exertion he still has the chest pressure, heaviness, tightness, or increased shortness of breath.   He has been no sores on his foot. At [place] 10/2013 we discussed the fact that he had some problems with his foot last summer when we get hot and sweaty. He tells me he has already thought about that and has planned to make sure he goes inside and airs out his foot frequently and does not let it get his sweaty and create sores.   He did not bring in a blood sugar log sheet. However he reports that his sugars continue to run good. Says fasting morning sugars are always around 100. This is after a meal they may be around 120. Says he has had no hypoglycemia. At lab 10/2013 A1C was 7.5 and my Result Note said to increase Glucotrol XL to 20mg  QD. He says he knows nothing about this. Says he is still taking the 10mg . He brought all med bottles with him to OV that day.    Past Medical  History  Diagnosis Date  . NIDDM (non-insulin dependent diabetes mellitus)   . Hypertension   . Undescended testicle   . CKD (chronic kidney disease)   . Vitamin D deficiency   . Prostate cancer   . Chronic hyperkalemia 12/23/2012    Sees Renal  . Headache(784.0)      Home Meds: Outpatient Prescriptions Prior to Visit  Medication Sig Dispense Refill  . amLODipine (NORVASC) 10 MG tablet Take 10 mg by mouth daily.      Marland Kitchen aspirin 81 MG chewable tablet Chew 81 mg by mouth daily.      Marland Kitchen  BYSTOLIC 5 MG tablet TAKE 1 TABLET BY MOUTH EVERY DAY  30 tablet  5  . finasteride (PROSCAR) 5 MG tablet Take 5 mg by mouth daily.        . furosemide (LASIX) 80 MG tablet Take 80 mg by mouth daily.       Marland Kitchen glipiZIDE (GLUCOTROL XL) 10 MG 24 hr tablet TAKE 1 TABLET (10 MG TOTAL) BY MOUTH DAILY.  30 tablet  5  . pioglitazone (ACTOS) 45 MG tablet Take 45 mg by mouth daily.      . sodium bicarbonate 650 MG tablet Take 650 mg by mouth 3 (three) times daily.       . calcitRIOL (ROCALTROL) 0.5 MCG capsule Take 0.5 mcg QD      . cholecalciferol (VITAMIN D) 1000 UNITS tablet Take 1,000 Units by mouth daily.      .         No facility-administered medications prior to visit.     Allergies:  Allergies  Allergen Reactions  . Enalapril Other (See Comments)    Hyperkalemia.  . Metformin And Related Other (See Comments)    Renal insufficiency   . Tekturna [Aliskiren] Other (See Comments)    hyperkalemia    History   Social History  . Marital Status: Single    Spouse Name: N/A    Number of Children: N/A  . Years of Education: N/A   Occupational History  . Not on file.   Social History Main Topics  . Smoking status: Former Smoker -- 1.00 packs/day for 25 years    Types: Cigarettes    Quit date: 10/28/1982  . Smokeless tobacco: Never Used  . Alcohol Use: No  . Drug Use: No  . Sexual Activity: Not on file   Other Topics Concern  . Not on file   Social History Narrative    Family History    Problem Relation Age of Onset  . Cancer Sister      Review of Systems:  See HPI for pertinent ROS. All other ROS negative.    Physical Exam: Blood pressure 118/64, pulse 60, temperature 98.1 F (36.7 C), temperature source Oral, resp. rate 18, weight 214 lb (97.07 kg)., Body mass index is 29.02 kg/(m^2). General: WNWD WM. Appears in no acute distress.  Neck: Supple. No thyromegaly. No lymphadenopathy. no carotid bruits.  Lungs: Clear bilaterally to auscultation without wheezes, rales, or rhonchi. Breathing is unlabored. Heart: RRR with S1 S2. No murmurs, rubs, or gallops. Abdomen: Soft, non-tender, non-distended with normoactive bowel sounds. No hepatomegaly. No rebound/guarding. No obvious abdominal masses. Musculoskeletal:  Strength and tone normal for age. Extremities/Skin: Warm and dry. No clubbing or cyanosis. No edema. No rashes or suspicious lesions. Neuro: Alert and oriented X 3. Moves all extremities spontaneously. Gait is normal. CNII-XII grossly in tact. Psych:  Responds to questions appropriately with a normal affect.He is in very good spirits, as always!! 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 Left foot has good sensation. There are no lesions or calluses or problem areas. He does have severe onychomycosis and has scaling and desquamation of the entire foot. His posterior tibial pulse is 2+ and bounding. However I cannot palpate a dorsalis pedis pulse.       ASSESSMENT AND PLAN:  73 y.o. year old male with   1. diabetes mellitus type 2  Continue the Levemir at 10 units QHS. Continue all medications the same. Call our office if he has any questions or concerns or if starts  having any low blood sugar readings. Today I did discuss with him to make sure to avoid hypoglycemia. Told him to make sure he eats on a routine/regular basis. If he ever has a GI virus that causes anorexia, nausea, vomiting/diarrhea, followup with [place] so we can help him adjust his  insulin.  See history of present illness regarding osteomyelitis of the right foot/ankle  Diabetic nephropathy: Dr. Lorrene Reid is managing this. See #3 below. Diabetic retinopathy evaluation: He sees Dr. Baird Cancer at the retina specialist routinely. Is that he did have an appointment scheduled with him January 2016 but this will have to be postponed until after his surgery on his right foot. Diabetic neuropathy: He does report that the problem is that of his left foot feels somewhat numb and tingly when he first stands up. However he states this improves when he walks around. I have offered him prescription medication to help the symptoms but he defers this again today.  No metformin secondary to kidney disease. No ACE inhibitor or ARB secondary to hyperkalemia/kidney disease.  2. Hypertension Blood pressure is at goal and well-controlled. No ACE Inh or ARB secondary to hyperkalemia.   3. CKD (chronic kidney disease)   Dr. Lorrene Reid has been sending me copies of her office notes and labs. Her last office note was dated 01/26/14. It Documents the following: Chronic kidney disease stage IV. Secondary to diabetes. Disease complications include: Hyperkalemia   Metabolic acidosis   Hyperparathyroidism. Medications that she prescribes:   furosemide  80 mg per day. Sodium bicarbonate 3 times a day. Calcitriol 0.50 daily  He had renal ultrasound 11/07/2012 which was within normal limits. Slightly enlarged prostate gland. Normal appearing kidneys. Right 11.9 cm left 10.8 cm    4. Chronic hyperkalemia Controlled and managed by Dr. Lorrene Reid as above.   5. End stage renal disease See #3 above. Has AV fistula. Has not yet required dialysis   6. H/O Prostate cancer   7.H/O Undescended testicle  8. Vitamin D deficiency Verified with patient he is now taking over-the-counter vitamin D 1000 units daily. This was started at [facility] 9/14. He had been given prescription vitamin D after he had labs that  were 2014. Did take that prescription weekly for 3 months. However when he completed the prescription he was taking no over-the-counter vitamin D until 9/14 office visit. Followup lab on 10/28/12 had shown vitamin D 33.   9. lipids: Presently his lipid profile has always been excellent. This is even without medication.  I repeated this again for followup on 10/28/12. Total cholesterol 158 triglycerides 271 HDL 34 LDL 70   10. 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 History of wounds on the left foot May 2012. Lower extremity arterial Dopplers May 2012 showed no evidence of arterial insufficiency.   12. Screening colonoscopy: Done 01/2010. Repeat 5 years secondary to poor prep.   13. Immunizations: Influenza vaccine: He received that at his office visit here 05/04/13.   Influenza vaccine given here 05/16/2014. Pneumonia vaccine: He had Pneumovax in 2010. Up-dated with  Prevnar 13  At [place] here 12/ 2014 Tetanus: Given in 2010 Zostavax: I discussed this with him 07/22/12 and again on 02/01/13.Today, again, he states He  still forgot to followup with checking with insurance. I wrote on his AVS at [place] for him to call his insurance company and find out the cause of this. If him about this again today but he says that he forgot all about it.  Follow up  for routine office visit in 3 months. Followup sooner if needed.   [address] [place], [place], [facility] 05/16/2014 1:38 PM

## 2014-05-22 ENCOUNTER — Telehealth: Payer: Self-pay | Admitting: Family Medicine

## 2014-05-22 MED ORDER — INSULIN NPH ISOPHANE & REGULAR (70-30) 100 UNIT/ML ~~LOC~~ SUSP: 5.0000 [IU] | Freq: Three times a day (TID) | SUBCUTANEOUS | Status: DC

## 2014-05-22 NOTE — Telephone Encounter (Signed)
Pt called me back.  Went into great detail about new insulin, how to take.  Need for blood sugars before and after meals.  Appt made for next Monday to follow up.  Pt repeated direction back to me.  Keep log of all BS readings and bring to appt.

## 2014-05-22 NOTE — Telephone Encounter (Signed)
-----   Message from Orlena Sheldon, PA-C sent at 05/17/2014 10:02 AM EST ----- A1C is up. He is on Lantus.  Checks Fasting AM sugars and these are excellent. Must be having high postprandial sugars. He is also scheduled for amputation of foot.  Need tight BS control.  Add mealtime Insulin-- 5 units at [facility] BS prior to meal and 2 hours after.  Send Rx for Humalog 70/30--5 units at meals TID Document these on a blood sugar log. Call us if he is getting any low readings less than 80. Schedule follow-up office visit with me in the next week. Bring the blood sugar log sheet to the appointment.

## 2014-05-22 NOTE — Telephone Encounter (Signed)
Have been unable to reach pt by phone.  No answer, no voice mail.  Called sister.  Told her need to reach patient quickly about lab results and medication adjustments.  She said she will have him call office.

## 2014-05-28 ENCOUNTER — Encounter (HOSPITAL_COMMUNITY)
Admission: RE | Admit: 2014-05-28 | Discharge: 2014-05-28 | Disposition: A | Discharge status: Home / Self Care | Payer: Medicare PPO | Source: Ambulatory Visit | Attending: Orthopedic Surgery | Admitting: Orthopedic Surgery

## 2014-05-28 ENCOUNTER — Encounter (HOSPITAL_COMMUNITY): Payer: Self-pay

## 2014-05-28 ENCOUNTER — Ambulatory Visit (INDEPENDENT_AMBULATORY_CARE_PROVIDER_SITE_OTHER): Payer: Medicare PPO | Admitting: Physician Assistant

## 2014-05-28 ENCOUNTER — Encounter: Payer: Self-pay | Admitting: Physician Assistant

## 2014-05-28 VITALS — BP 132/70 | HR 80 | Temp 98.4°F | Resp 18 | Wt 218.0 lb

## 2014-05-28 DIAGNOSIS — M86171 Other acute osteomyelitis, right ankle and foot: Secondary | ICD-10-CM

## 2014-05-28 DIAGNOSIS — E118 Type 2 diabetes mellitus with unspecified complications: Secondary | ICD-10-CM

## 2014-05-28 LAB — COMPREHENSIVE METABOLIC PANEL
ALT: 11 U/L (ref 0–53)
ANION GAP: 10 (ref 5–15)
AST: 21 U/L (ref 0–37)
Albumin: 3.3 g/dL — ABNORMAL LOW (ref 3.5–5.2)
Alkaline Phosphatase: 50 U/L (ref 39–117)
BUN: 52 mg/dL — ABNORMAL HIGH (ref 6–23)
CALCIUM: 8.8 mg/dL (ref 8.4–10.5)
CO2: 24 mmol/L (ref 19–32)
Chloride: 106 mEq/L (ref 96–112)
Creatinine, Ser: 3.19 mg/dL — ABNORMAL HIGH (ref 0.50–1.35)
GFR calc non Af Amer: 18 mL/min — ABNORMAL LOW (ref >90)
GFR, EST AFRICAN AMERICAN: 21 mL/min — AB (ref >90)
GLUCOSE: 232 mg/dL — AB (ref 70–99)
Potassium: 3.6 mmol/L (ref 3.5–5.1)
Sodium: 140 mmol/L (ref 135–145)
TOTAL PROTEIN: 7.2 g/dL (ref 6.0–8.3)
Total Bilirubin: 0.4 mg/dL (ref 0.3–1.2)

## 2014-05-28 LAB — CBC
HCT: 31.6 % — ABNORMAL LOW (ref 39.0–52.0)
Hemoglobin: 10 g/dL — ABNORMAL LOW (ref 13.0–17.0)
MCH: 28.9 pg (ref 26.0–34.0)
MCHC: 31.6 g/dL (ref 30.0–36.0)
MCV: 91.3 fL (ref 78.0–100.0)
Platelets: 205 10*3/uL (ref 150–400)
RBC: 3.46 MIL/uL — ABNORMAL LOW (ref 4.22–5.81)
RDW: 14.2 % (ref 11.5–15.5)
WBC: 6.8 10*3/uL (ref 4.0–10.5)

## 2014-05-28 LAB — APTT: APTT: 32 s (ref 24–37)

## 2014-05-28 LAB — PROTIME-INR
INR: 1.1 (ref 0.00–1.49)
Prothrombin Time: 14.3 seconds (ref 11.6–15.2)

## 2014-05-28 NOTE — Progress Notes (Signed)
PCP is Dena Billet, PA-C at [facility].  Denies seeing a cardiologist. Denies ever having a Card cath, Stress test, or echo. Reports that his fasting CBG's run a little over 100.

## 2014-05-28 NOTE — Pre-Procedure Instructions (Signed)
Enio Hornback Lingle  05/28/2014   Your procedure is scheduled on:  Jan 6th at 1040  Report to [facility] Admitting at [phone].  Call this number if you have problems the morning of surgery: [phone]   Remember:   Do not eat food or drink liquids after midnight.   Take these medicines the morning of surgery with A SIP OF WATER: Amlodipine (Norvasc), Bystolic, Finasteride (Proscar)  Stop taking Aspirin, Aleve, Ibuprofen, BC's, Goody's, Herbal medications, Fish Oil   Do not wear jewelry, make-up or nail polish.  Do not wear lotions, powders, or perfumes. You may wear deodorant.  Do not shave 48 hours prior to surgery. Men may shave face and neck.  Do not bring valuables to the hospital.  [facility] is not responsible  for any belongings or valuables.               Contacts, dentures or bridgework may not be worn into surgery.  Leave suitcase in the car. After surgery it may be brought to your room.  For patients admitted to the hospital, discharge time is determined by your treatment team.               Patients discharged the day of surgery will not be allowed to drive home.    Special Instructions: [place] - Preparing for Surgery  Before surgery, you can play an important role.  Because skin is not sterile, your skin needs to be as free of germs as possible.  You can reduce the number of germs on you skin by washing with CHG (chlorahexidine gluconate) soap before surgery.  CHG is an antiseptic cleaner which kills germs and bonds with the skin to continue killing germs even after washing.  Please DO NOT use if you have an allergy to CHG or antibacterial soaps.  If your skin becomes reddened/irritated stop using the CHG and inform your nurse when you arrive at Short Stay.  Do not shave (including legs and underarms) for at least 48 hours prior to the first CHG shower.  You may shave your face.  Please follow these instructions carefully:   1.  Shower with CHG Soap the night  before surgery and the morning of Surgery.  2.  If you choose to wash your hair, wash your hair first as usual with your  normal shampoo.  3.  After you shampoo, rinse your hair and body thoroughly to remove the Shampoo.  4.  Use CHG as you would any other liquid soap.  You can apply chg directly to the skin and wash gently with scrungie or a clean washcloth.  5.  Apply the CHG Soap to your body ONLY FROM THE NECK DOWN.  Do not use on open wounds or open sores.  Avoid contact with your eyes,  ears, mouth and genitals (private parts).  Wash genitals (private parts)  with your normal soap.  6.  Wash thoroughly, paying special attention to the area where your surgery will be performed.  7.  Thoroughly rinse your body with warm water from the neck down.  8.  DO NOT shower/wash with your normal soap after using and rinsing off the CHG Soap.  9.  Pat yourself dry with a clean towel.            10.  Wear clean pajamas.            11.  Place clean sheets on your bed the night of your first shower  and do not        sleep with pets.  Day of Surgery  Do not apply any lotions/deoderants the morning of surgery.  Please wear clean clothes to the hospital/surgery center.      Please read over the following fact sheets that you were given: Pain Booklet, Coughing and Deep Breathing and Surgical Site Infection Prevention

## 2014-05-28 NOTE — Progress Notes (Signed)
Patient ID: Phillip Herring MRN: [id number], DOB: Jan 29, 1941, 74 y.o. Date of Encounter: 05/28/2014, 3:25 PM    Chief Complaint:  Chief Complaint  Patient presents with  . follow up sugars    says sugars have been better  forgot sheet     HPI: 74 y.o. year old white male for follow-up of his diabetes.  At his last routine office visit on 05/16/14 A1c came back elevated at 8.8. At that time he had told me that his fasting blood sugars were good on the Levemir 10 units daily at bedtime. However at that point he had only been checking fasting morning readings. Based on this information and the fact that his A1c was 8.8, presenting that his postprandial sugars must be high. At that time I added mealtime insulin 5 units at mealtimes 3 times a day.  Patient states that he is administering the insulin as directed. Still using Levemir 10 units at bedtime. Using the mealtime insulin 3 times a day at meals. He does not check his blood sugar prior to meals. He simply administers 5 units when he eats. He does check blood sugar 2 hours postprandial.  Says these are sometimes as high as 170 but mostly around 140-150. Says that he is getting low blood sugars and is not having any episodes of weakness or shaking her episodes of feeling hypoglycemic.  He is scheduled for right foot amputation Wednesday, January 6. He went to the hospital for preop testing this morning. He had planned to go back by his house to pick up the blood sugar log sheet to bring to the appointment but did not have time to do that between the preop testing. Therefore he does not have actual blood sugar log sheet that is told me this information for memory.  He is scheduled for the surgery as stated above. They plan for him to go to rehabilitation facility postop.     Home Meds:   Outpatient Prescriptions Prior to Visit  Medication Sig Dispense Refill  . amLODipine (NORVASC) 10 MG tablet TAKE 1 TABLET (10 MG TOTAL) BY MOUTH  DAILY. 30 tablet 5  . aspirin EC 81 MG tablet Take 81 mg by mouth daily.    . B-D INS SYR ULTRAFINE 1CC/31G 31G X 5/16" 1 ML MISC daily. for testing  99  . BYSTOLIC 5 MG tablet TAKE 1 TABLET BY MOUTH EVERY DAY 30 tablet 5  . calcitRIOL (ROCALTROL) 0.5 MCG capsule Take 0.5 mcg by mouth daily.    . cholecalciferol (VITAMIN D) 1000 UNITS tablet Take 1,000 Units by mouth daily.    . finasteride (PROSCAR) 5 MG tablet Take 5 mg by mouth daily.      . furosemide (LASIX) 80 MG tablet Take 80 mg by mouth daily.     . insulin detemir (LEVEMIR) 100 UNIT/ML injection Inject 0.1 mLs (10 Units total) into the skin at bedtime. 10 mL 3  . insulin NPH-regular Human (HUMULIN 70/30) (70-30) 100 UNIT/ML injection Inject 5 Units into the skin 3 (three) times daily with meals. 10 mL 11  . Insulin Syringe-Needle U-100 (BD INSULIN SYRINGE ULTRAFINE) 31G X 15/64" 1 ML MISC 1 each by Does not apply route daily. 100 each 6  . pioglitazone (ACTOS) 45 MG tablet Take 45 mg by mouth daily.    . sodium bicarbonate 650 MG tablet Take 650 mg by mouth 3 (three) times daily.      No facility-administered medications prior to visit.  Allergies:  Allergies  Allergen Reactions  . Enalapril Other (See Comments)    Hyperkalemia.  . Metformin And Related Other (See Comments)    Renal insufficiency   . Tekturna [Aliskiren] Other (See Comments)    hyperkalemia      Review of Systems: See HPI for pertinent ROS. All other ROS negative.    Physical Exam: Blood pressure 132/70, pulse 80, temperature 98.4 F (36.9 C), temperature source Oral, resp. rate 18, weight 218 lb (98.884 kg)., Body mass index is 29.56 kg/(m^2). General:  WNWD WM. Appears in no acute distress. Neck: Supple. No thyromegaly. No lymphadenopathy. Lungs: Clear bilaterally to auscultation without wheezes, rales, or rhonchi. Breathing is unlabored. Heart: Regular rhythm. No murmurs, rubs, or gallops. Msk:  Strength and tone normal for  age. Extremities/Skin: Warm and dry.  Neuro: Alert and oriented X 3. Moves all extremities spontaneously. Gait is normal. CNII-XII grossly in tact. Psych:  Responds to questions appropriately with a normal affect.     ASSESSMENT AND PLAN:  74 y.o. year old male with  1. Diabetes mellitus type 2 with complications Continue current treatment. He says he has an appointment with Dr. Lorrene Reid at [facility] tomorrow. Scheduled for right foot amputation Wednesday, January 6. He will follow up with me once he is discharged from hospital and rehabilitation.   Marin Olp [place], [place], [facility] 05/28/2014 3:25 PM

## 2014-05-28 NOTE — Pre-Procedure Instructions (Signed)
Phillip Herring  05/28/2014   Your procedure is scheduled on:  Jan 6th at 1040  [facility] at [facility] AM.  Call this number if you have problems the morning of surgery: [phone]   Remember:   Do not eat food or drink liquids after midnight.   Take these medicines the morning of surgery with A SIP OF WATER: Amlodipine(Norvasc), Bystolic, Finasteride(Proscar)  Stop taking Aspirin, Ibuprofen, Aleve, BC's, Goody's, Herbal medications, and Fish Oil   Do not wear jewelry, make-up or nail polish.  Do not wear lotions, powders, or perfumes. You may wear deodorant.  Do not shave 48 hours prior to surgery. Men may shave face and neck.  Do not bring valuables to the hospital.  [facility] is not responsible  for any belongings or valuables.               Contacts, dentures or bridgework may not be worn into surgery.  Leave suitcase in the car. After surgery it may be brought to your room.  For patients admitted to the hospital, discharge time is determined by your treatment team.               Patients discharged the day of surgery will not be allowed to drive home.    Special Instructions: Phillip Herring - Preparing for Surgery  Before surgery, you can play an important role.  Because skin is not sterile, your skin needs to be as free of germs as possible.  You can reduce the number of germs on you skin by washing with CHG (chlorahexidine gluconate) soap before surgery.  CHG is an antiseptic cleaner which kills germs and bonds with the skin to continue killing germs even after washing.  Please DO NOT use if you have an allergy to CHG or antibacterial soaps.  If your skin becomes reddened/irritated stop using the CHG and inform your nurse when you arrive at Short Stay.  Do not shave (including legs and underarms) for at least 48 hours prior to the first CHG shower.  You may shave your face.  Please follow these instructions carefully:   1.  Shower with CHG Soap the  night before surgery and the morning of Surgery.  2.  If you choose to wash your hair, wash your hair first as usual with your   normal shampoo.  3.  After you shampoo, rinse your hair and body thoroughly to remove the  Shampoo.  4.  Use CHG as you would any other liquid soap.  You can apply chg directly to the skin and wash gently with scrungie or a clean washcloth.  5.  Apply the CHG Soap to your body ONLY FROM THE NECK DOWN.    Do not use on open wounds or open sores.  Avoid contact with your eyes, ears, mouth and genitals (private parts).  Wash genitals (private parts)       with your normal soap.  6.  Wash thoroughly, paying special attention to the area where your surgery   will be performed.  7.  Thoroughly rinse your body with warm water from the neck down.  8.  DO NOT shower/wash with your normal soap after using and rinsing off the CHG Soap.  9.  Pat yourself dry with a clean towel.            10.  Wear clean pajamas.            11.  Place clean  sheets on your bed the night of your first shower and do not sleep with pets.  Day of Surgery  Do not apply any lotions/deoderants the morning of surgery.  Please wear clean clothes to the hospital/surgery center.      Please read over the following fact sheets that you were given: Pain Booklet, Coughing and Deep Breathing and Surgical Site Infection Prevention

## 2014-05-28 NOTE — Progress Notes (Signed)
   05/28/14 [place]  Have you ever been diagnosed with sleep apnea through a sleep study? No  Do you snore loudly (loud enough to be heard through closed doors)?  0  Do you often feel tired, fatigued, or sleepy during the daytime? 0  Has anyone observed you stop breathing during your sleep? 0  Do you have, or are you being treated for high blood pressure? 1  BMI more than 35 kg/m2? 0  Age over 72 years old? 1  Neck circumference greater than 40 cm/16 inches? 1  Gender: 1  Obstructive Sleep Apnea Score 4  Score 4 or greater  Results sent to PCP

## 2014-05-29 MED ORDER — CEFAZOLIN SODIUM-DEXTROSE 2-3 GM-% IV SOLR
2.0000 g | INTRAVENOUS | Status: AC
  Administered 2014-05-30: 2 g via INTRAVENOUS
  Filled 2014-05-29: qty 50

## 2014-05-29 NOTE — Progress Notes (Signed)
Anesthesia Chart Review: Patient is a 74 year old male scheduled for right BKA on 05/30/14 by Dr. Sharol Given. Diagnosis: Osteomyelitis with right heel ulcer.  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 PCP is Dena Billet, PA-C, last visit 05/28/14. She is aware of surgery plans. Nephrologists is Dr. Lorrene Reid at [facility], with reported visit scheduled for today.   EKG on 05/28/14 showed NSR, cannot rule out anterior infarct (age undetermined).  No significant changed since last tracing on 04/13/13.  CXR on 05/28/14: No active cardiopulmonary disease.  Preoperative labs noted.  Cr 3.19 (Cr 2.70 - 2.90 when compared to labs in Epic since 07/2013), K 3.6.  Glucose 232.  H/H 10.0/31.6.  A1C fingerstick 02/07/14 was 8.5. Patient with known CKD with left Cimino AVF and is actively being followed by nephrology. I think renal function can be followed post-operatively with consideration of post-operative Hospitalist or nephrology consult.    If no acute changes then I would anticipate that he could proceed as planned.  [facility] Short Stay Center/Anesthesiology Phone [phone] 05/29/2014 11:12 AM

## 2014-05-30 ENCOUNTER — Encounter (HOSPITAL_COMMUNITY)
Admission: RE | Disposition: A | Discharge status: Skilled Nursing Facility | Payer: Self-pay | Source: Ambulatory Visit | Attending: Orthopedic Surgery

## 2014-05-30 ENCOUNTER — Encounter (HOSPITAL_COMMUNITY): Payer: Self-pay | Admitting: *Deleted

## 2014-05-30 ENCOUNTER — Inpatient Hospital Stay (HOSPITAL_COMMUNITY): Payer: Medicare PPO | Admitting: Certified Registered Nurse Anesthetist

## 2014-05-30 ENCOUNTER — Inpatient Hospital Stay (HOSPITAL_COMMUNITY)
Admission: RE | Admit: 2014-05-30 | Discharge: 2014-06-03 | DRG: 476 | Disposition: A | Discharge status: Skilled Nursing Facility | Payer: Medicare PPO | Source: Ambulatory Visit | Attending: Orthopedic Surgery | Admitting: Orthopedic Surgery

## 2014-05-30 ENCOUNTER — Inpatient Hospital Stay (HOSPITAL_COMMUNITY): Payer: Medicare PPO | Admitting: Vascular Surgery

## 2014-05-30 DIAGNOSIS — N189 Chronic kidney disease, unspecified: Secondary | ICD-10-CM | POA: Diagnosis present

## 2014-05-30 DIAGNOSIS — Z8546 Personal history of malignant neoplasm of prostate: Secondary | ICD-10-CM

## 2014-05-30 DIAGNOSIS — M868X6 Other osteomyelitis, lower leg: Secondary | ICD-10-CM | POA: Diagnosis present

## 2014-05-30 DIAGNOSIS — Z89519 Acquired absence of unspecified leg below knee: Secondary | ICD-10-CM

## 2014-05-30 DIAGNOSIS — I129 Hypertensive chronic kidney disease with stage 1 through stage 4 chronic kidney disease, or unspecified chronic kidney disease: Secondary | ICD-10-CM | POA: Diagnosis present

## 2014-05-30 DIAGNOSIS — G629 Polyneuropathy, unspecified: Secondary | ICD-10-CM | POA: Diagnosis present

## 2014-05-30 DIAGNOSIS — L97519 Non-pressure chronic ulcer of other part of right foot with unspecified severity: Secondary | ICD-10-CM | POA: Diagnosis present

## 2014-05-30 DIAGNOSIS — E1140 Type 2 diabetes mellitus with diabetic neuropathy, unspecified: Secondary | ICD-10-CM | POA: Diagnosis present

## 2014-05-30 DIAGNOSIS — Z87891 Personal history of nicotine dependence: Secondary | ICD-10-CM

## 2014-05-30 DIAGNOSIS — M869 Osteomyelitis, unspecified: Secondary | ICD-10-CM | POA: Diagnosis present

## 2014-05-30 DIAGNOSIS — I70235 Atherosclerosis of native arteries of right leg with ulceration of other part of foot: Secondary | ICD-10-CM | POA: Diagnosis present

## 2014-05-30 HISTORY — DX: Chronic kidney disease, stage 4 (severe): N18.4

## 2014-05-30 HISTORY — PX: FOOT AMPUTATION: SHX951

## 2014-05-30 HISTORY — PX: AMPUTATION: SHX166

## 2014-05-30 LAB — GLUCOSE, CAPILLARY
GLUCOSE-CAPILLARY: 88 mg/dL (ref 70–99)
GLUCOSE-CAPILLARY: 112 mg/dL — AB (ref 70–99)
Glucose-Capillary: 77 mg/dL (ref 70–99)
Glucose-Capillary: 68 mg/dL — ABNORMAL LOW (ref 70–99)
Glucose-Capillary: 105 mg/dL — ABNORMAL HIGH (ref 70–99)
Glucose-Capillary: 89 mg/dL (ref 70–99)

## 2014-05-30 SURGERY — AMPUTATION BELOW KNEE
Anesthesia: General | Laterality: Right

## 2014-05-30 MED ORDER — OXYCODONE HCL 5 MG PO TABS: 5.0000 mg | ORAL_TABLET | Freq: Once | ORAL | Status: DC | PRN

## 2014-05-30 MED ORDER — INSULIN ASPART 100 UNIT/ML ~~LOC~~ SOLN
0.0000 [IU] | Freq: Three times a day (TID) | SUBCUTANEOUS | Status: DC
  Administered 2014-05-31: 2 [IU] via SUBCUTANEOUS
  Administered 2014-05-31 – 2014-06-03 (×6): 3 [IU] via SUBCUTANEOUS

## 2014-05-30 MED ORDER — SODIUM CHLORIDE 0.9 % IV SOLN
INTRAVENOUS | Status: DC
  Administered 2014-05-30: 09:00:00 via INTRAVENOUS

## 2014-05-30 MED ORDER — INSULIN DETEMIR 100 UNIT/ML ~~LOC~~ SOLN
10.0000 [IU] | Freq: Every day | SUBCUTANEOUS | Status: DC
  Administered 2014-05-30 – 2014-06-02 (×4): 10 [IU] via SUBCUTANEOUS
  Filled 2014-05-30 (×5): qty 0.1

## 2014-05-30 MED ORDER — PROPOFOL 10 MG/ML IV BOLUS
INTRAVENOUS | Status: AC
  Filled 2014-05-30: qty 20

## 2014-05-30 MED ORDER — PHENYLEPHRINE 40 MCG/ML (10ML) SYRINGE FOR IV PUSH (FOR BLOOD PRESSURE SUPPORT)
PREFILLED_SYRINGE | INTRAVENOUS | Status: AC
  Filled 2014-05-30: qty 10

## 2014-05-30 MED ORDER — FENTANYL CITRATE 0.05 MG/ML IJ SOLN: 25.0000 ug | INTRAMUSCULAR | Status: DC | PRN

## 2014-05-30 MED ORDER — ASPIRIN EC 325 MG PO TBEC
325.0000 mg | DELAYED_RELEASE_TABLET | Freq: Every day | ORAL | Status: DC
  Administered 2014-05-30 – 2014-06-03 (×5): 325 mg via ORAL
  Filled 2014-05-30 (×5): qty 1

## 2014-05-30 MED ORDER — FENTANYL CITRATE 0.05 MG/ML IJ SOLN
INTRAMUSCULAR | Status: AC
  Filled 2014-05-30: qty 5

## 2014-05-30 MED ORDER — ONDANSETRON HCL 4 MG/2ML IJ SOLN
INTRAMUSCULAR | Status: AC
  Filled 2014-05-30: qty 2

## 2014-05-30 MED ORDER — PIOGLITAZONE HCL 45 MG PO TABS
45.0000 mg | ORAL_TABLET | Freq: Every day | ORAL | Status: DC
  Administered 2014-05-31 – 2014-06-03 (×4): 45 mg via ORAL
  Filled 2014-05-30 (×4): qty 1

## 2014-05-30 MED ORDER — METHOCARBAMOL 1000 MG/10ML IJ SOLN
500.0000 mg | INTRAVENOUS | Status: AC
  Administered 2014-05-30: 500 mg via INTRAVENOUS
  Filled 2014-05-30: qty 5

## 2014-05-30 MED ORDER — ONDANSETRON HCL 4 MG/2ML IJ SOLN: 4.0000 mg | Freq: Once | INTRAMUSCULAR | Status: DC | PRN

## 2014-05-30 MED ORDER — LIDOCAINE HCL (CARDIAC) 10 MG/ML IV SOLN
INTRAVENOUS | Status: DC | PRN
  Administered 2014-05-30: 60 mg via INTRAVENOUS

## 2014-05-30 MED ORDER — FUROSEMIDE 80 MG PO TABS
80.0000 mg | ORAL_TABLET | Freq: Every day | ORAL | Status: DC
  Administered 2014-05-30 – 2014-06-03 (×5): 80 mg via ORAL
  Filled 2014-05-30 (×5): qty 1

## 2014-05-30 MED ORDER — CALCITRIOL 0.5 MCG PO CAPS
0.5000 ug | ORAL_CAPSULE | Freq: Every day | ORAL | Status: DC
  Administered 2014-05-30 – 2014-06-03 (×5): 0.5 ug via ORAL
  Filled 2014-05-30 (×5): qty 1

## 2014-05-30 MED ORDER — CEFAZOLIN SODIUM 1-5 GM-% IV SOLN
1.0000 g | Freq: Four times a day (QID) | INTRAVENOUS | Status: AC
  Administered 2014-05-30 – 2014-05-31 (×3): 1 g via INTRAVENOUS
  Filled 2014-05-30 (×3): qty 50

## 2014-05-30 MED ORDER — INSULIN ASPART 100 UNIT/ML ~~LOC~~ SOLN
4.0000 [IU] | Freq: Three times a day (TID) | SUBCUTANEOUS | Status: DC
  Administered 2014-05-30 – 2014-06-03 (×12): 4 [IU] via SUBCUTANEOUS

## 2014-05-30 MED ORDER — SODIUM CHLORIDE 0.9 % IV SOLN
INTRAVENOUS | Status: DC
  Administered 2014-05-30: 16:00:00 via INTRAVENOUS

## 2014-05-30 MED ORDER — METHOCARBAMOL 1000 MG/10ML IJ SOLN
500.0000 mg | Freq: Four times a day (QID) | INTRAVENOUS | Status: DC | PRN
  Filled 2014-05-30: qty 5

## 2014-05-30 MED ORDER — METOCLOPRAMIDE HCL 5 MG/ML IJ SOLN
5.0000 mg | Freq: Three times a day (TID) | INTRAMUSCULAR | Status: DC | PRN
  Filled 2014-05-30: qty 2

## 2014-05-30 MED ORDER — DOCUSATE SODIUM 100 MG PO CAPS
100.0000 mg | ORAL_CAPSULE | Freq: Two times a day (BID) | ORAL | Status: DC
  Administered 2014-05-30 – 2014-06-03 (×8): 100 mg via ORAL
  Filled 2014-05-30 (×10): qty 1

## 2014-05-30 MED ORDER — LIDOCAINE HCL (CARDIAC) 20 MG/ML IV SOLN
INTRAVENOUS | Status: AC
  Filled 2014-05-30: qty 5

## 2014-05-30 MED ORDER — OXYCODONE-ACETAMINOPHEN 5-325 MG PO TABS
1.0000 | ORAL_TABLET | ORAL | Status: DC | PRN
  Administered 2014-05-31: 1 via ORAL
  Administered 2014-06-01: 2 via ORAL
  Administered 2014-06-01: 1 via ORAL
  Administered 2014-06-02: 2 via ORAL
  Filled 2014-05-30: qty 2
  Filled 2014-05-30: qty 1
  Filled 2014-05-30: qty 2
  Filled 2014-05-30: qty 1

## 2014-05-30 MED ORDER — FENTANYL CITRATE 0.05 MG/ML IJ SOLN
INTRAMUSCULAR | Status: DC | PRN
  Administered 2014-05-30: 100 ug via INTRAVENOUS
  Administered 2014-05-30: 50 ug via INTRAVENOUS

## 2014-05-30 MED ORDER — HYDROMORPHONE HCL 1 MG/ML IJ SOLN: 0.5000 mg | INTRAMUSCULAR | Status: DC | PRN

## 2014-05-30 MED ORDER — PHENYLEPHRINE HCL 10 MG/ML IJ SOLN
INTRAMUSCULAR | Status: DC | PRN
  Administered 2014-05-30: 80 ug via INTRAVENOUS
  Administered 2014-05-30 (×2): 120 ug via INTRAVENOUS

## 2014-05-30 MED ORDER — 0.9 % SODIUM CHLORIDE (POUR BTL) OPTIME
TOPICAL | Status: DC | PRN
  Administered 2014-05-30: 1000 mL

## 2014-05-30 MED ORDER — METOCLOPRAMIDE HCL 5 MG/ML IJ SOLN
INTRAMUSCULAR | Status: AC
  Filled 2014-05-30: qty 2

## 2014-05-30 MED ORDER — OXYCODONE HCL 5 MG/5ML PO SOLN: 5.0000 mg | Freq: Once | ORAL | Status: DC | PRN

## 2014-05-30 MED ORDER — SODIUM BICARBONATE 650 MG PO TABS
650.0000 mg | ORAL_TABLET | Freq: Three times a day (TID) | ORAL | Status: DC
  Administered 2014-05-30 – 2014-06-03 (×12): 650 mg via ORAL
  Filled 2014-05-30 (×14): qty 1

## 2014-05-30 MED ORDER — ONDANSETRON HCL 4 MG PO TABS: 4.0000 mg | ORAL_TABLET | Freq: Four times a day (QID) | ORAL | Status: DC | PRN

## 2014-05-30 MED ORDER — TRANEXAMIC ACID 100 MG/ML IV SOLN
2000.0000 mg | INTRAVENOUS | Status: AC
  Administered 2014-05-30: 2000 mg via TOPICAL
  Filled 2014-05-30: qty 20

## 2014-05-30 MED ORDER — SODIUM CHLORIDE 0.9 % IJ SOLN
INTRAMUSCULAR | Status: AC
  Filled 2014-05-30: qty 10

## 2014-05-30 MED ORDER — ONDANSETRON HCL 4 MG/2ML IJ SOLN: 4.0000 mg | Freq: Four times a day (QID) | INTRAMUSCULAR | Status: DC | PRN

## 2014-05-30 MED ORDER — ONDANSETRON HCL 4 MG/2ML IJ SOLN
INTRAMUSCULAR | Status: DC | PRN
  Administered 2014-05-30: 4 mg via INTRAVENOUS

## 2014-05-30 MED ORDER — METOCLOPRAMIDE HCL 10 MG PO TABS: 5.0000 mg | ORAL_TABLET | Freq: Three times a day (TID) | ORAL | Status: DC | PRN

## 2014-05-30 MED ORDER — ASPIRIN EC 81 MG PO TBEC: 81.0000 mg | DELAYED_RELEASE_TABLET | Freq: Every day | ORAL | Status: DC

## 2014-05-30 MED ORDER — ARTIFICIAL TEARS OP OINT
TOPICAL_OINTMENT | OPHTHALMIC | Status: AC
  Filled 2014-05-30: qty 3.5

## 2014-05-30 MED ORDER — FINASTERIDE 5 MG PO TABS
5.0000 mg | ORAL_TABLET | Freq: Every day | ORAL | Status: DC
  Administered 2014-05-31 – 2014-06-03 (×4): 5 mg via ORAL
  Filled 2014-05-30 (×4): qty 1

## 2014-05-30 MED ORDER — PROPOFOL 10 MG/ML IV BOLUS
INTRAVENOUS | Status: DC | PRN
  Administered 2014-05-30: 180 mg via INTRAVENOUS

## 2014-05-30 MED ORDER — NEBIVOLOL HCL 5 MG PO TABS
5.0000 mg | ORAL_TABLET | Freq: Every day | ORAL | Status: DC
  Administered 2014-05-31 – 2014-06-03 (×4): 5 mg via ORAL
  Filled 2014-05-30 (×4): qty 1

## 2014-05-30 MED ORDER — METOCLOPRAMIDE HCL 5 MG/ML IJ SOLN
INTRAMUSCULAR | Status: DC | PRN
  Administered 2014-05-30: 10 mg via INTRAVENOUS

## 2014-05-30 MED ORDER — AMLODIPINE BESYLATE 10 MG PO TABS
10.0000 mg | ORAL_TABLET | Freq: Every day | ORAL | Status: DC
  Administered 2014-05-31 – 2014-06-03 (×4): 10 mg via ORAL
  Filled 2014-05-30 (×4): qty 1

## 2014-05-30 MED ORDER — METHOCARBAMOL 500 MG PO TABS
500.0000 mg | ORAL_TABLET | Freq: Four times a day (QID) | ORAL | Status: DC | PRN
  Filled 2014-05-30: qty 1

## 2014-05-30 SURGICAL SUPPLY — 37 items
BLADE SAW RECIP 87.9 MT (BLADE) ×3 IMPLANT
BLADE SURG 21 STRL SS (BLADE) ×3 IMPLANT
BNDG COHESIVE 6X5 TAN STRL LF (GAUZE/BANDAGES/DRESSINGS) ×3 IMPLANT
BNDG GAUZE ELAST 4 BULKY (GAUZE/BANDAGES/DRESSINGS) ×3 IMPLANT
COVER SURGICAL LIGHT HANDLE (MISCELLANEOUS) ×3 IMPLANT
CUFF TOURNIQUET SINGLE 34IN LL (TOURNIQUET CUFF) ×3 IMPLANT
CUFF TOURNIQUET SINGLE 44IN (TOURNIQUET CUFF) IMPLANT
DRAPE EXTREMITY T 121X128X90 (DRAPE) ×3 IMPLANT
DRAPE PROXIMA HALF (DRAPES) IMPLANT
DRAPE U-SHAPE 47X51 STRL (DRAPES) ×3 IMPLANT
DRSG ADAPTIC 3X8 NADH LF (GAUZE/BANDAGES/DRESSINGS) ×3 IMPLANT
DRSG PAD ABDOMINAL 8X10 ST (GAUZE/BANDAGES/DRESSINGS) IMPLANT
DURAPREP 26ML APPLICATOR (WOUND CARE) ×3 IMPLANT
ELECT REM PT RETURN 9FT ADLT (ELECTROSURGICAL) ×3
ELECTRODE REM PT RTRN 9FT ADLT (ELECTROSURGICAL) ×1 IMPLANT
GAUZE SPONGE 4X4 12PLY STRL (GAUZE/BANDAGES/DRESSINGS) ×3 IMPLANT
GLOVE BIOGEL PI IND STRL 9 (GLOVE) ×1 IMPLANT
GLOVE BIOGEL PI INDICATOR 9 (GLOVE) ×2
GLOVE SURG ORTHO 9.0 STRL STRW (GLOVE) ×3 IMPLANT
GOWN STRL REUS W/ TWL XL LVL3 (GOWN DISPOSABLE) ×3 IMPLANT
GOWN STRL REUS W/TWL XL LVL3 (GOWN DISPOSABLE) ×6
KIT BASIN OR (CUSTOM PROCEDURE TRAY) ×3 IMPLANT
KIT ROOM TURNOVER OR (KITS) ×3 IMPLANT
MANIFOLD NEPTUNE II (INSTRUMENTS) ×3 IMPLANT
NS IRRIG 1000ML POUR BTL (IV SOLUTION) ×3 IMPLANT
PACK GENERAL/GYN (CUSTOM PROCEDURE TRAY) ×3 IMPLANT
PAD ABD 8X10 STRL (GAUZE/BANDAGES/DRESSINGS) ×3 IMPLANT
PAD ARMBOARD 7.5X6 YLW CONV (MISCELLANEOUS) ×6 IMPLANT
SPONGE LAP 18X18 X RAY DECT (DISPOSABLE) IMPLANT
STAPLER VISISTAT 35W (STAPLE) ×3 IMPLANT
STOCKINETTE IMPERVIOUS LG (DRAPES) ×3 IMPLANT
SUT SILK 2 0 (SUTURE) ×2
SUT SILK 2-0 18XBRD TIE 12 (SUTURE) ×1 IMPLANT
SUT VIC AB 1 CTX 27 (SUTURE) ×6 IMPLANT
TOWEL OR 17X24 6PK STRL BLUE (TOWEL DISPOSABLE) ×3 IMPLANT
TOWEL OR 17X26 10 PK STRL BLUE (TOWEL DISPOSABLE) ×3 IMPLANT
WATER STERILE IRR 1000ML POUR (IV SOLUTION) IMPLANT

## 2014-05-30 NOTE — Op Note (Signed)
   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  PROCEDURE: Transtibial amputation  SURGEON: Sharol Given, M.D.  ANESTHESIA:  general  IV FLUIDS AND URINE: See anesthesia.  ESTIMATED BLOOD LOSS: min mL.  COMPLICATIONS: None.  DESCRIPTION OF PROCEDURE: The patient was brought to the operating room and underwent a general anesthetic. After adequate levels of anesthesia were obtained patient's lower extremity was prepped using DuraPrep draped into a sterile field. A timeout was called.  A transverse incision was made 11 cm distal to the tibial tubercle. This curved proximally and a large posterior flap was created. The tibia was transected 1 cm proximal to the skin incision. The fibula was transected just proximal to the tibial incision. The tibia was beveled anteriorly. A large posterior flap was created. The sciatic nerve was pulled cut and allowed to retract. The vascular bundles were suture ligated with 2-0 silk. The deep and superficial fascial layers were closed using #1 Vicryl. The skin was closed using staples and 2-0 nylon. The wound was covered with Adaptic orthopedic sponges AB dressing Kerlix and Coban. Patient was extubated taken to the PACU in stable condition.  Phillip Score, MD [address] 11:34 AM

## 2014-05-30 NOTE — Progress Notes (Addendum)
Phillip Herring [id number] Admission Data: 05/30/2014 4:15 PM Attending Provider: Newt Minion, MD  QRF:XJOIT,GPQD BETH, PA-C Consults/ Treatment Team:    LEANARD Herring is a 74 y.o. male patient admitted from the OR awake, alert  & orientated  X 3,  Full Code, VSS - Blood pressure 105/63, pulse 71, temperature 99.1 F (37.3 C), temperature source Oral, resp. rate 16, height 6' (1.829 m), weight 102.876 kg (226 lb 12.8 oz), SpO2 97 %.,no c/o shortness of breath, no c/o chest pain, no distress noted.  IV site WDL:  Right arm, KVO  Allergies:   Allergies  Allergen Reactions  . Enalapril Other (See Comments)    Hyperkalemia.  . Metformin And Related Other (See Comments)    Renal insufficiency   . Tekturna [Aliskiren] Other (See Comments)    hyperkalemia     Past Medical History  Diagnosis Date  . NIDDM (non-insulin dependent diabetes mellitus)   . Hypertension   . Undescended testicle   . CKD (chronic kidney disease)   . Vitamin D deficiency   . Prostate cancer   . Chronic hyperkalemia 12/23/2012    Sees Renal  . Headache(784.0)      Pt orientation to unit, room and routine. Information packet given to patient/family and safety video watched.  Admission INP armband ID verified with patient/family, and in place. SR up x 2, fall risk assessment complete with Patient and family verbalizing understanding of risks associated with falls. Pt verbalizes an understanding of how to use the call bell and to call for help before getting out of bed. Skin is dry to lower extremities, some bruising noted to arms. Pt. States " I bruise really easily and bump my arms at times".     Will cont to monitor and assist as needed.  [name], RN 05/30/2014 4:15 PM

## 2014-05-30 NOTE — Progress Notes (Signed)
Utilization review completed.  

## 2014-05-30 NOTE — Anesthesia Postprocedure Evaluation (Signed)
  Anesthesia Post-op Note  Patient: Phillip Herring  Procedure(s) Performed: Procedure(s): AMPUTATION BELOW KNEE (Right)  Patient Location: PACU  Anesthesia Type:General  Level of Consciousness: awake, alert  and oriented  Airway and Oxygen Therapy: Patient Spontanous Breathing and Patient connected to nasal cannula oxygen  Post-op Pain: mild  Post-op Assessment: Post-op Vital signs reviewed, Patient's Cardiovascular Status Stable, Respiratory Function Stable, Patent Airway, No signs of Nausea or vomiting and Pain level controlled  Post-op Vital Signs: stable  Last Vitals:  Filed Vitals:   05/30/14 1528  BP: 105/63  Pulse: 71  Temp: 37.3 C  Resp: 16    Complications: No anesthetic complications

## 2014-05-30 NOTE — Anesthesia Preprocedure Evaluation (Signed)
Anesthesia Evaluation  Patient identified by MRN, date of birth, ID band Patient awake    Reviewed: Allergy & Precautions, NPO status , Patient's Chart, lab work & pertinent test results  Airway Mallampati: II  TM Distance: >3 FB Neck ROM: Full    Dental  (+) Edentulous Lower, Edentulous Upper   Pulmonary former smoker,  breath sounds clear to auscultation        Cardiovascular hypertension, Rhythm:Regular Rate:Normal     Neuro/Psych    GI/Hepatic   Endo/Other  diabetes  Renal/GU      Musculoskeletal   Abdominal   Peds  Hematology   Anesthesia Other Findings   Reproductive/Obstetrics                             Anesthesia Physical Anesthesia Plan  ASA: III  Anesthesia Plan: General   Post-op Pain Management:    Induction: Intravenous  Airway Management Planned: LMA  Additional Equipment:   Intra-op Plan:   Post-operative Plan:   Informed Consent: I have reviewed the patients History and Physical, chart, labs and discussed the procedure including the risks, benefits and alternatives for the proposed anesthesia with the patient or authorized representative who has indicated his/her understanding and acceptance.     Plan Discussed with: CRNA and Anesthesiologist  Anesthesia Plan Comments: (Osteomyelitis R. Foot Type 2 DM glucose  Renal insufficiency Cr 3.19 Hypertension   Plan GA with Oral ETT  Roberts Gaudy)        Anesthesia Quick Evaluation

## 2014-05-30 NOTE — H&P (Signed)
Phillip Herring is an 74 y.o. male.   Chief Complaint: Osteomyelitis ulceration right lower extremity. HPI: Patient is a 75 year old gentleman diabetic insensate neuropathy peripheral vascular disease who has failed conservative limb salvage intervention.  Past Medical History  Diagnosis Date  . NIDDM (non-insulin dependent diabetes mellitus)   . Hypertension   . Undescended testicle   . CKD (chronic kidney disease)   . Vitamin D deficiency   . Prostate cancer   . Chronic hyperkalemia 12/23/2012    Sees Renal  . [id number](030.0)     Past Surgical History  Procedure Laterality Date  . Foot amputation    . Eye surgery Bilateral     laser  . Av fistula placement Left 04/17/2013    Procedure: ARTERIOVENOUS (AV) FISTULA CREATION- LEFT RADIOCEPHALIC;  Surgeon: Rosetta Posner, MD;  Location: [facility];  Service: Vascular;  Laterality: Left;  . Shuntogram N/A 08/03/2013    Procedure: Earney Mallet;  Surgeon: Conrad Atkins, MD;  Location: [facility] CATH [facility];  Service: Cardiovascular;  Laterality: N/A;  . Colonoscopy      Family History  Problem Relation Age of Onset  . Cancer Sister    Social History:  reports that he quit smoking about 31 years ago. His smoking use included Cigarettes. He has a 25 pack-year smoking history. He has never used smokeless tobacco. He reports that he does not drink alcohol or use illicit drugs.  Allergies:  Allergies  Allergen Reactions  . Enalapril Other (See Comments)    Hyperkalemia.  . Metformin And Related Other (See Comments)    Renal insufficiency   . Tekturna [Aliskiren] Other (See Comments)    hyperkalemia    No prescriptions prior to admission    Results for orders placed or performed during the hospital encounter of 05/28/14 (from the past 48 hour(s))  APTT     Status: None   Collection Time: 05/28/14 12:40 PM  Result Value Ref Range   aPTT 32 24 - 37 seconds  CBC     Status: Abnormal   Collection Time: 05/28/14 12:40 PM  Result Value Ref Range   WBC  6.8 4.0 - 10.5 K/uL   RBC 3.46 (L) 4.22 - 5.81 MIL/uL   Hemoglobin 10.0 (L) 13.0 - 17.0 g/dL   HCT 31.6 (L) 39.0 - 52.0 %   MCV 91.3 78.0 - 100.0 fL   MCH 28.9 26.0 - 34.0 pg   MCHC 31.6 30.0 - 36.0 g/dL   RDW 14.2 11.5 - 15.5 %   Platelets 205 150 - 400 K/uL  Comprehensive metabolic panel     Status: Abnormal   Collection Time: 05/28/14 12:40 PM  Result Value Ref Range   Sodium 140 135 - 145 mmol/L    Comment: Please note change in reference range.   Potassium 3.6 3.5 - 5.1 mmol/L    Comment: Please note change in reference range.   Chloride 106 96 - 112 mEq/L   CO2 24 19 - 32 mmol/L   Glucose, Bld 232 (H) 70 - 99 mg/dL   BUN 52 (H) 6 - 23 mg/dL   Creatinine, Ser 3.19 (H) 0.50 - 1.35 mg/dL   Calcium 8.8 8.4 - 10.5 mg/dL   Total Protein 7.2 6.0 - 8.3 g/dL   Albumin 3.3 (L) 3.5 - 5.2 g/dL   AST 21 0 - 37 U/L   ALT 11 0 - 53 U/L   Alkaline Phosphatase 50 39 - 117 U/L   Total Bilirubin 0.4 0.3 - 1.2 mg/dL  GFR calc non Af Amer 18 (L) >90 mL/min   GFR calc Af Amer 21 (L) >90 mL/min    Comment: (NOTE) The eGFR has been calculated using the CKD EPI equation. This calculation has not been validated in all clinical situations. eGFR's persistently <90 mL/min signify possible Chronic Kidney Disease.    Anion gap 10 5 - 15  Protime-INR     Status: None   Collection Time: 05/28/14 12:40 PM  Result Value Ref Range   Prothrombin Time 14.3 11.6 - 15.2 seconds   INR 1.10 0.00 - 1.49   Dg Chest 2 View  05/28/2014   CLINICAL DATA:  Preop for below knee amputation, right foot osteomyelitis is  EXAM: CHEST  2 VIEW  COMPARISON:  04/13/2013  FINDINGS: Cardiomediastinal silhouette is stable. No acute infiltrate or pleural effusion. No pulmonary edema. Minimal degenerative changes lower thoracic spine. Stable linear scarring left base.  IMPRESSION: No active cardiopulmonary disease.   Electronically Signed   By: Lahoma Crocker M.D.   On: 05/28/2014 13:36    Review of Systems  All other systems  reviewed and are negative.   There were no vitals taken for this visit. Physical Exam  Examination patient has osteomyelitis ulceration involving the right lower extremity. 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  Plan: We'll plan for right transtibial amputation. Risks and benefits were discussed including risk of the wound nonhealing risk for higher level amputation. Patient states he he understands and wished to proceed at this time.  DUDA,MARCUS V 05/30/2014, 6:28 AM

## 2014-05-30 NOTE — Transfer of Care (Signed)
Immediate Anesthesia Transfer of Care Note  Patient: Phillip Herring  Procedure(s) Performed: Procedure(s): AMPUTATION BELOW KNEE (Right)  Patient Location: PACU  Anesthesia Type:General  Level of Consciousness: awake, alert , oriented and patient cooperative  Airway & Oxygen Therapy: Patient Spontanous Breathing and Patient connected to face mask oxygen  Post-op Assessment: Report given to PACU RN, Post -op Vital signs reviewed and stable and Patient moving all extremities  Post vital signs: Reviewed and stable  Complications: No apparent anesthesia complications

## 2014-05-31 ENCOUNTER — Encounter (HOSPITAL_COMMUNITY): Payer: Self-pay | Admitting: Orthopedic Surgery

## 2014-05-31 LAB — GLUCOSE, CAPILLARY
GLUCOSE-CAPILLARY: 121 mg/dL — AB (ref 70–99)
GLUCOSE-CAPILLARY: 127 mg/dL — AB (ref 70–99)
GLUCOSE-CAPILLARY: 178 mg/dL — AB (ref 70–99)
GLUCOSE-CAPILLARY: 89 mg/dL (ref 70–99)
Glucose-Capillary: 34 mg/dL — CL (ref 70–99)
Glucose-Capillary: 106 mg/dL — ABNORMAL HIGH (ref 70–99)
Glucose-Capillary: 170 mg/dL — ABNORMAL HIGH (ref 70–99)

## 2014-05-31 MED ORDER — DEXTROSE 50 % IV SOLN
25.0000 mL | Freq: Once | INTRAVENOUS | Status: AC
  Administered 2014-05-31: 25 mL via INTRAVENOUS

## 2014-05-31 NOTE — Evaluation (Signed)
Occupational Therapy Evaluation Patient Details Name: Phillip Herring MRN: [id number] DOB: [birth year] Today's Date: 05/31/2014    History of Present Illness Pt admitted with R heel osteomyelitis, s/p R transtibial amputation.   Clinical Impression   Patient admitted with above. Patient mod I PTA. Patient currently functioning at an overall supervision>min assist level. Please see OT problem list below. Feel patient will benefit from acute OT to increase overall independence in the areas of ADLs & functional mobility and in order to safely discharge to venue listed below.      Follow Up Recommendations  SNF;Supervision/Assistance - 24 hour    Equipment Recommendations   (Defer to next venue of care)    Recommendations for Other Services  None at this time     Precautions / Restrictions Precautions Precautions: Fall Restrictions Weight Bearing Restrictions: Yes RLE Weight Bearing: Non weight bearing      Mobility - From PT eval Bed Mobility Overal bed mobility: Needs Assistance Bed Mobility: Supine to Sit;Sit to Supine     Supine to sit: Supervision Sit to supine: Supervision      Transfers Overall transfer level: Needs assistance   Transfers: Sit to/from Stand Sit to Stand: Min assist         General transfer comment: cues for best hand placement    Balance - From PT eval Overall balance assessment: Needs assistance  Sitting-balance support: No upper extremity supported;Single extremity supported Sitting balance-Leahy Scale: Fair Sitting balance - Comments: good in midline,  trouble with posterior challenges   Standing balance support: Bilateral upper extremity supported   Standing balance comment: steady using RW     ADL Overall ADL's : Needs assistance/impaired Eating/Feeding: Independent   Grooming: Supervision/safety;Sitting   Upper Body Bathing: Supervision/ safety;Sitting   Lower Body Bathing: Minimal assistance;Sit to/from stand   Upper Body  Dressing : Supervision/safety;Sitting   Lower Body Dressing: Minimal assistance;Sit to/from stand   Toilet Transfer: Minimal assistance;BSC;RW;Grab bars   Toileting- Clothing Manipulation and Hygiene: Minimal assistance;Sit to/from stand         General ADL Comments: Patient overall supervision > min assist for functional mobility and tasks. Patient requesting to go SNF secondary to "unable to take care of self" when he gets home. Patient concerned about IADL tasks and has noone to assist 24/7 with these tasks. Patient will benefit from continued therapy to increase his overall independence in the area of ADLs and IADLs and improve overall quality of life.     Vision  No apparent deficits.    Perception Perception Perception Tested?: No   Praxis Praxis Praxis tested?: Within functional limits    Pertinent Vitals/Pain Pain Assessment: 0-10 Pain Score: 6  Faces Pain Scale: Hurts little more Pain Location: Right residual limb Pain Descriptors / Indicators: Discomfort Pain Intervention(s): Monitored during session     Hand Dominance Right   Extremity/Trunk Assessment Upper Extremity Assessment Upper Extremity Assessment: Generalized weakness   Lower Extremity Assessment Lower Extremity Assessment: Defer to PT evaluation RLE Deficits / Details: generally weak   Cervical / Trunk Assessment Cervical / Trunk Assessment: Normal   Communication Communication Communication: No difficulties   Cognition Arousal/Alertness: Awake/alert Behavior During Therapy: WFL for tasks assessed/performed Overall Cognitive Status: Within Functional Limits for tasks assessed              Home Living Family/patient expects to be discharged to:: Private residence Living Arrangements: Alone Available Help at Discharge:  (none available per patient and family report) Type of Home: House  Home Access: Stairs to enter Entrance Stairs-Number of Steps: 2 steps Entrance Stairs-Rails:  None Home Layout: One level     Bathroom Shower/Tub: Tub/shower unit;Curtain   Bathroom Toilet: Handicapped height     Home Equipment: Environmental consultant - 2 wheels;Tub bench          Prior Functioning/Environment Level of Independence: Independent with assistive device(s)             OT Diagnosis: Generalized weakness;Acute pain   OT Problem List: Decreased strength;Decreased activity tolerance;Impaired balance (sitting and/or standing);Decreased coordination;Decreased safety awareness;Pain   OT Treatment/Interventions: Self-care/ADL training;Therapeutic exercise;Energy conservation;Therapeutic activities;Patient/family education;Balance training    OT Goals(Current goals can be found in the care plan section) Acute Rehab OT Goals Patient Stated Goal: get my prosthesis OT Goal Formulation: With patient Time For Goal Achievement: 06/14/14 Potential to Achieve Goals: Good ADL Goals Pt Will Perform Grooming: with modified independence;standing Pt Will Perform Upper Body Bathing: with modified independence;sitting Pt Will Perform Lower Body Bathing: with modified independence;sit to/from stand;with adaptive equipment Pt Will Perform Upper Body Dressing: with modified independence;sitting Pt Will Perform Lower Body Dressing: with modified independence;sit to/from stand;with adaptive equipment Pt Will Transfer to Toilet: with modified independence;bedside commode;ambulating Pt Will Perform Toileting - Clothing Manipulation and hygiene: with modified independence;sit to/from stand Pt Will Perform Tub/Shower Transfer: Tub transfer;with modified independence;ambulating;tub bench;rolling walker;grab bars Pt/caregiver will Perform Home Exercise Program: Increased strength;Both right and left upper extremity;Independently;With written HEP provided  OT Frequency: Min 2X/week   Barriers to D/C: Decreased caregiver support          End of Session Equipment Utilized During Treatment: Gait  belt;Rolling walker  Activity Tolerance: Patient tolerated treatment well Patient left: in chair;with call bell/phone within reach   Time: 1550-1610 OT Time Calculation (min): 20 min Charges:  OT General Charges $OT Visit: 1 Procedure OT Evaluation $Initial OT Evaluation Tier I: 1 Procedure OT Treatments $Self Care/Home Management : 8-22 mins  Fujiko Picazo , MS, OTR/L, CLT  05/31/2014, 4:16 PM

## 2014-05-31 NOTE — Care Management Note (Unsigned)
    Page 1 of 1   05/31/2014     4:29:01 PM CARE MANAGEMENT NOTE 05/31/2014  Patient:  Phillip Herring, Phillip Herring   Account Number:  [id number]  Date Initiated:  05/31/2014  Documentation initiated by:  Tomi Bamberger  Subjective/Objective Assessment:   dx osteo and ulcer right heel  admit- lives alone.     Action/Plan:   pt eval- rec cir- pt not eligible for CIR, plan for snf.   Anticipated DC Date:  06/02/2014   Anticipated DC Plan:  SKILLED NURSING FACILITY  In-house referral  Clinical Social Worker      DC Planning Services  CM consult      Choice offered to / List presented to:             Status of service:  In process, will continue to follow Medicare Important Message given?   (If response is "NO", the following Medicare IM given date fields will be blank) Date Medicare IM given:   Medicare IM given by:   Date Additional Medicare IM given:   Additional Medicare IM given by:    Discharge Disposition:    Per UR Regulation:  Reviewed for med. necessity/level of care/duration of stay  If discussed at [place] of Stay Meetings, dates discussed:    Comments:

## 2014-05-31 NOTE — Progress Notes (Signed)
Patient ID: Phillip Herring, male   DOB: Aug 26, 1940, 74 y.o.   MRN: [id number] Post operative day 1 right BKA, patient is comfortable, dressing dry, plan for SNF placement.

## 2014-05-31 NOTE — Evaluation (Signed)
Physical Therapy Evaluation Patient Details Name: Phillip Herring MRN: [id number] DOB: 1940/10/28.1kvn Today's Date: 05/31/2014   History of Present Illness  Pt admitted with R heel osteomyelitis, s/p R transtibial amputation.  Clinical Impression  Pt admitted with/for BKA.  Pt currently limited functionally due to the problems listed. ( See problems list.)   Pt will benefit from PT to maximize function and safety in order to get ready for next venue listed below.     Follow Up Recommendations CIR    Equipment Recommendations   (tub seat not bench, pt having mods done to home)    Recommendations for Other Services Rehab consult     Precautions / Restrictions Precautions Precautions: Fall      Mobility  Bed Mobility Overal bed mobility: Needs Assistance Bed Mobility: Supine to Sit;Sit to Supine     Supine to sit: Supervision Sit to supine: Supervision      Transfers Overall transfer level: Needs assistance   Transfers: Sit to/from Stand Sit to Stand: Min assist         General transfer comment: cues for best hand placement  Ambulation/Gait Ambulation/Gait assistance: Min guard Ambulation Distance (Feet): 20 Feet Assistive device: Rolling walker (2 wheeled) Gait Pattern/deviations:  (swing to patter)     General Gait Details: generally steady swing to pattern  Stairs            Wheelchair Mobility    Modified Rankin (Stroke Patients Only)       Balance Overall balance assessment: Needs assistance Sitting-balance support: No upper extremity supported;Single extremity supported Sitting balance-Leahy Scale: Fair Sitting balance - Comments: good in midline,  trouble with posterior challenges   Standing balance support: Bilateral upper extremity supported   Standing balance comment: steady using RW                             Pertinent Vitals/Pain Pain Assessment: Faces Faces Pain Scale: Hurts little more Pain Location: R stump  area Pain Descriptors / Indicators: Discomfort Pain Intervention(s): Monitored during session    Home Living Family/patient expects to be discharged to:: Skilled nursing facility Living Arrangements: Alone                    Prior Function Level of Independence: Independent;Independent with assistive device(s)               Hand Dominance        Extremity/Trunk Assessment               Lower Extremity Assessment: Overall WFL for tasks assessed;RLE deficits/detail RLE Deficits / Details: generally weak       Communication   Communication: No difficulties  Cognition Arousal/Alertness: Awake/alert Behavior During Therapy: WFL for tasks assessed/performed Overall Cognitive Status: Within Functional Limits for tasks assessed                      General Comments General comments (skin integrity, edema, etc.): Intiated BKA education     Exercises Amputee Exercises Quad Sets: AROM;Right;10 reps;Supine Gluteal Sets: AROM;Right;10 reps;Supine Hip Extension: AROM;Right;10 reps;Supine Hip ABduction/ADduction: AROM;Right;5 reps;Seated Hip Flexion/Marching: AROM;Right;10 reps;Supine Straight Leg Raises: AROM;Right;10 reps;Supine Other Exercises Other Exercises: bridging with L LE x10 reps      Assessment/Plan    PT Assessment Patient needs continued PT services  PT Diagnosis Generalized weakness;Acute pain   PT Problem List Decreased strength;Decreased range of motion;Decreased activity tolerance;Decreased balance;Decreased mobility;Decreased knowledge  of use of DME;Pain  PT Treatment Interventions DME instruction;Gait training;Stair training;Therapeutic activities;Functional mobility training;Therapeutic exercise;Patient/family education   PT Goals (Current goals can be found in the Care Plan section) Acute Rehab PT Goals Patient Stated Goal: get my prosthesis PT Goal Formulation: With patient Time For Goal Achievement: 06/07/14 Potential to  Achieve Goals: Good    Frequency Min 3X/week   Barriers to discharge Decreased caregiver support      Co-evaluation               End of Session   Activity Tolerance: Patient tolerated treatment well Patient left: in bed;with call bell/phone within reach;with family/visitor present Nurse Communication: Mobility status         Time: 5830-9407 PT Time Calculation (min) (ACUTE ONLY): 22 min   Charges:   PT Evaluation $Initial PT Evaluation Tier I: 1 Procedure PT Treatments $Gait Training: 8-22 mins   PT G Codes:        Jeslyn Amsler, Tessie Fass 05/31/2014, 3:39 PM  05/31/2014  Donnella Sham, PT [phone]  (pager)

## 2014-06-01 LAB — GLUCOSE, CAPILLARY
GLUCOSE-CAPILLARY: 82 mg/dL (ref 70–99)
GLUCOSE-CAPILLARY: 95 mg/dL (ref 70–99)
Glucose-Capillary: 96 mg/dL (ref 70–99)
Glucose-Capillary: 186 mg/dL — ABNORMAL HIGH (ref 70–99)

## 2014-06-01 NOTE — Progress Notes (Signed)
Patient ID: Phillip Herring, male   DOB: 1940-12-19, 74 y.o.   MRN: [id number] Postoperative day 2 right transtibial amputation. Patient states that he is comfortable. Patient states that he lives home alone and will not be able to care for himself postoperatively he requested nursing home placement.

## 2014-06-01 NOTE — Progress Notes (Signed)
Inpatient Diabetes Program Recommendations  AACE/ADA: New Consensus Statement on Inpatient Glycemic Control (2013)  Target Ranges:  Prepandial:   less than 140 mg/dL      Peak postprandial:   less than 180 mg/dL (1-2 hours)      Critically ill patients:  140 - 180 mg/dL   Reason for Assessment:  Results for Phillip Herring, Phillip Herring (MRN [id number]) as of 06/01/2014 10:43  Ref. Range 05/31/2014 14:26 05/31/2014 14:58 05/31/2014 17:08 05/31/2014 22:07 05/31/2014 23:52 06/01/2014 08:00  Glucose-Capillary Latest Range: 70-99 mg/dL 34 (LL) 106 (H) 170 (H) 178 (H) 127 (H) 96   Please consider holding Novolog meal coverage due to low CBG on 05/31/14.  Will follow.  Thanks, Adah Perl, RN, BC-ADM Inpatient Diabetes Coordinator Pager [phone]

## 2014-06-01 NOTE — Clinical Social Work Note (Signed)
Clinical Social Worker met with patient at bedside to offer continued support and discuss discharge plans.  Patient has a bed at [facility] and patient is agreeable with placement at discharge.  Patient plans to notify family when they come to visit this afternoon.  Facility is able to accept patient over the weekend if insurance authorization is received and patient is medically stable.  CSW awaiting approval from [facility] prior to discharge.  CSW remains available for support and to facilitate patient discharge needs once medically ready.  Barbette Or, [place]

## 2014-06-01 NOTE — Clinical Social Work Note (Signed)
Clinical Social Work Department BRIEF PSYCHOSOCIAL ASSESSMENT 05/31/2014  Patient:  Phillip Herring, Phillip Herring     Account Number:  [id number]     Admit date:  05/30/2014  Clinical Social Worker:  Myles Lipps  Date/Time:  05/31/2014 12:00 N  Referred by:  Care Management  Date Referred:  05/31/2014 Referred for  SNF Placement   Other Referral:   Interview type:  Patient Other interview type:   No family/friends currently at bedside - patient sister on the way    PSYCHOSOCIAL DATA Living Status:  ALONE Admitted from facility:   Level of care:   Primary support name:  Phillip Herring,Phillip Herring  [phone] Primary support relationship to patient:  SIBLING Degree of support available:   Strong    CURRENT CONCERNS Current Concerns  Post-Acute Placement   Other Concerns:    SOCIAL WORK ASSESSMENT / PLAN Clinical Social Worker met with patient at bedside to offer support and discuss patient needs at discharge.  Patient states that he lives at home, but within a few blocks of his family members.  Patient understanding that he will not be able to immediately return home alone and is agreeable with ST-SNF placement.  Patient states that he has had conversation with his family and has preference to [facility].  CSW has completed FL2 and will initiate search to [facility] with preference to [facility].  CSW to follow up with patient and family to provide bed offers once available.  CSW remains available for support and to facilitate patient discharge needs once medically stable.   Assessment/plan status:  Psychosocial Support/Ongoing Assessment of Needs Other assessment/ plan:   Information/referral to community resources:   Clinical Social Worker offered patient facility list to share with his sister upon her arrival, however patient states that he feels confident they are aware of his options.    PATIENT'S/FAMILY'S RESPONSE TO PLAN OF CARE: Patient alert and oriented x3 sitting up in bed.   Patient remained engaged in assessment process and forthcoming with information.  Patient with good family support in the area and is hopeful for short term rehab and return home. Patient understanding of SNF search process and CSW role. Patient verbalized his appreciation for CSW involvement and support.

## 2014-06-01 NOTE — Clinical Social Work Placement (Signed)
Clinical Social Work Department CLINICAL SOCIAL WORK PLACEMENT NOTE 05/31/2014  Patient:  Phillip Herring, Phillip Herring  Account Number:  [id number] Admit date:  05/30/2014  Clinical Social Worker:  Barbette Or, LCSW  Date/time:  05/31/2014 12:00 N  Clinical Social Work is seeking post-discharge placement for this patient at the following level of care:   SKILLED NURSING   (*CSW will update this form in Epic as items are completed)   05/31/2014  Patient/family provided with [place] Department of Clinical Social Work's list of facilities offering this level of care within the geographic area requested by the patient (or if unable, by the patient's family).  05/31/2014  Patient/family informed of their freedom to choose among providers that offer the needed level of care, that participate in Medicare, Medicaid or managed care program needed by the patient, have an available bed and are willing to accept the patient.  05/31/2014  Patient/family informed of [facility]' ownership interest in [facility], as well as of the fact that they are under no obligation to receive care at this facility.  PASARR submitted to EDS on 05/31/2014 PASARR number received on 05/31/2014  FL2 transmitted to all facilities in geographic area requested by pt/family on  05/31/2014 FL2 transmitted to all facilities within larger geographic area on   Patient informed that his/her managed care company has contracts with or will negotiate with  certain facilities, including the following:     Patient/family informed of bed offers received:  05/31/2014 Patient chooses bed at [facility] Physician recommends and patient chooses bed at    Patient to be transferred to [facility] on   Patient to be transferred to facility by  Patient and family notified of transfer on  Name of family member notified:    The following physician request were entered in Epic:   Additional Comments: 05/31/2014 - PT/OT evaluations  sent to [facility] to initiate [facility] authorization.

## 2014-06-01 NOTE — Clinical Documentation Improvement (Addendum)
Query #1 Cause and effect relationships cannot be assumed in ICD 10. Osteomyelitis documented in current record.  Ulceration of the right lower extremity also documented.  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  Please document if a condition below provides greater specificity regarding any relationship between the patient's DM and the osteomyelitis and the right lower extremity ulcer:   - Diabetic Foot Ulcer with Diabetic Associated Osteomyelitis and Diabetic Peripheral Vascular Disease   - Other Condition or Associated Condition   - Unable to Clinically Determine    (Please include the Type of Diabetes - I or II)   Query #2 "CKD" documented in current medical record.  Please Stage the patient's CKD:   - CKD Stage I - GFR > OR = 90  - CKD Stage II - GFR 60-89  - CKD Stage III - GFR 30-59  - CKD Stage IV - GFR 15-29  - CKD Stage V - GFR < 15  - ESRD (End Stage Renal Disease)  - Unable to Clinically Determine    Thank You, Erling Conte ,RN Clinical Documentation Specialist:  [phone] [facility]

## 2014-06-01 NOTE — Progress Notes (Signed)
Physical Therapy Treatment Patient Details Name: MICAIAH LITLE MRN: [id number] DOB: Mar 19, 1941 Today's Date: 06/01/2014    History of Present Illness Pt admitted with R heel osteomyelitis, s/p R transtibial amputation.    PT Comments    Progressing well.  Painful day, but worked through exercise well.  Follow Up Recommendations  CIR     Equipment Recommendations  None recommended by PT    Recommendations for Other Services       Precautions / Restrictions Precautions Precautions: Fall    Mobility  Bed Mobility Overal bed mobility: Modified Independent             General bed mobility comments: no assist needed.  mild struggle to sit up without pulling or pushing with UE  Transfers Overall transfer level: Needs assistance   Transfers: Sit to/from Stand Sit to Stand: Min guard         General transfer comment: cues for best hand placement  Ambulation/Gait Ambulation/Gait assistance: Min guard Ambulation Distance (Feet): 30 Feet Assistive device: Rolling walker (2 wheeled) Gait Pattern/deviations:  ("swing to")     General Gait Details: generally steady swing to pattern   Stairs            Wheelchair Mobility    Modified Rankin (Stroke Patients Only)       Balance Overall balance assessment: Needs assistance   Sitting balance-Leahy Scale: Fair Sitting balance - Comments: good in midline,  trouble with posterior challenges       Standing balance comment: needs RW for stance                    Cognition Arousal/Alertness: Awake/alert Behavior During Therapy: WFL for tasks assessed/performed Overall Cognitive Status: Within Functional Limits for tasks assessed                      Exercises Amputee Exercises Quad Sets: AROM;Right;10 reps;Supine Gluteal Sets: AROM;Right;10 reps;Supine Hip Extension: AROM;Right;10 reps;Supine Hip ABduction/ADduction: AROM;Right;5 reps;Seated Hip Flexion/Marching: AROM;Right;10  reps;Supine Straight Leg Raises: AROM;Right;10 reps;Supine Other Exercises Other Exercises: bridging with L LE x10 reps    General Comments        Pertinent Vitals/Pain Pain Assessment: 0-10 Pain Score: 8  Pain Location: stump R Pain Descriptors / Indicators: Aching;Burning Pain Intervention(s): Limited activity within patient's tolerance    Home Living                      Prior Function            PT Goals (current goals can now be found in the care plan section) Acute Rehab PT Goals Patient Stated Goal: get my prosthesis PT Goal Formulation: With patient Time For Goal Achievement: 06/07/14 Potential to Achieve Goals: Good Progress towards PT goals: Progressing toward goals    Frequency  Min 3X/week    PT Plan      Co-evaluation             End of Session     Patient left: in bed;with call bell/phone within reach;with family/visitor present     Time: 8588-5027 PT Time Calculation (min) (ACUTE ONLY): 25 min  Charges:  $Gait Training: 8-22 mins $Therapeutic Exercise: 8-22 mins                    G Codes:      Tamecka Milham, Tessie Fass 06/01/2014, 2:21 PM 06/01/2014  Donnella Sham, PT [phone]  (pager)

## 2014-06-02 LAB — GLUCOSE, CAPILLARY
GLUCOSE-CAPILLARY: 163 mg/dL — AB (ref 70–99)
Glucose-Capillary: 154 mg/dL — ABNORMAL HIGH (ref 70–99)
Glucose-Capillary: 78 mg/dL (ref 70–99)
Glucose-Capillary: 144 mg/dL — ABNORMAL HIGH (ref 70–99)

## 2014-06-02 NOTE — Progress Notes (Signed)
Pt stable Dressing ok SNF plc pending

## 2014-06-03 LAB — GLUCOSE, CAPILLARY
GLUCOSE-CAPILLARY: 166 mg/dL — AB (ref 70–99)
Glucose-Capillary: 123 mg/dL — ABNORMAL HIGH (ref 70–99)
Glucose-Capillary: 132 mg/dL — ABNORMAL HIGH (ref 70–99)

## 2014-06-03 MED ORDER — OXYCODONE-ACETAMINOPHEN 5-325 MG PO TABS: 1.0000 | ORAL_TABLET | ORAL | Status: DC | PRN

## 2014-06-03 NOTE — Progress Notes (Signed)
Patient stable ain controled l stump dressing dry  skilled nursing placement pending

## 2014-06-03 NOTE — Discharge Summary (Signed)
Physician Discharge Summary  Patient ID: Phillip Herring MRN: [id number] DOB/AGE: [birth year] 72 y.o.  Admit date: 05/30/2014 Discharge date: 06/03/2014  Admission Diagnoses:  Active Problems:   S/P BKA (below knee amputation) unilateral   Discharge Diagnoses:  Same  Surgeries: Procedure(s): AMPUTATION BELOW KNEE on 05/30/2014   Consultants:    Discharged Condition: Stable  Hospital Course: Phillip Herring is an 74 y.o. male who was admitted 05/30/2014 with a chief complaint of infection, and found to have a diagnosis of leg infection.  They were brought to the operating room on 05/30/2014 and underwent the above named procedures. Tolerated the procedure well  - -dressing ok at dc    Antibiotics given:  Anti-infectives    Start     Dose/Rate Route Frequency Ordered Stop   05/30/14 1800  ceFAZolin (ANCEF) IVPB 1 g/50 mL premix     1 g100 mL/hr over 30 Minutes Intravenous Every 6 hours 05/30/14 1525 05/31/14 0534   05/30/14 0600  ceFAZolin (ANCEF) IVPB 2 g/50 mL premix     2 g100 mL/hr over 30 Minutes Intravenous On call to O.R. 05/29/14 1404 05/30/14 1103    .  Recent vital signs:  Filed Vitals:   06/03/14 0512  BP: 121/77  Pulse: 68  Temp: 99 F (37.2 C)  Resp: 15    Recent laboratory studies:  Results for orders placed or performed during the hospital encounter of 05/30/14  Glucose, capillary  Result Value Ref Range   Glucose-Capillary 77 70 - 99 mg/dL  Glucose, capillary  Result Value Ref Range   Glucose-Capillary 88 70 - 99 mg/dL   Comment 1 Documented in Chart    Comment 2 Notify RN   Glucose, capillary  Result Value Ref Range   Glucose-Capillary 68 (L) 70 - 99 mg/dL  Glucose, capillary  Result Value Ref Range   Glucose-Capillary 112 (H) 70 - 99 mg/dL  Glucose, capillary  Result Value Ref Range   Glucose-Capillary 105 (H) 70 - 99 mg/dL  Glucose, capillary  Result Value Ref Range   Glucose-Capillary 89 70 - 99 mg/dL   Comment 1 Documented in Chart    Comment 2  Notify RN   Glucose, capillary  Result Value Ref Range   Glucose-Capillary 89 70 - 99 mg/dL  Glucose, capillary  Result Value Ref Range   Glucose-Capillary 121 (H) 70 - 99 mg/dL  Glucose, capillary  Result Value Ref Range   Glucose-Capillary 34 (LL) 70 - 99 mg/dL   Comment 1 Notify RN   Glucose, capillary  Result Value Ref Range   Glucose-Capillary 106 (H) 70 - 99 mg/dL  Glucose, capillary  Result Value Ref Range   Glucose-Capillary 170 (H) 70 - 99 mg/dL  Glucose, capillary  Result Value Ref Range   Glucose-Capillary 178 (H) 70 - 99 mg/dL  Glucose, capillary  Result Value Ref Range   Glucose-Capillary 127 (H) 70 - 99 mg/dL  Glucose, capillary  Result Value Ref Range   Glucose-Capillary 96 70 - 99 mg/dL  Glucose, capillary  Result Value Ref Range   Glucose-Capillary 186 (H) 70 - 99 mg/dL  Glucose, capillary  Result Value Ref Range   Glucose-Capillary 82 70 - 99 mg/dL  Glucose, capillary  Result Value Ref Range   Glucose-Capillary 95 70 - 99 mg/dL  Glucose, capillary  Result Value Ref Range   Glucose-Capillary 154 (H) 70 - 99 mg/dL  Glucose, capillary  Result Value Ref Range   Glucose-Capillary 163 (H) 70 - 99 mg/dL  Glucose, capillary  Result Value Ref Range   Glucose-Capillary 78 70 - 99 mg/dL  Glucose, capillary  Result Value Ref Range   Glucose-Capillary 144 (H) 70 - 99 mg/dL   Comment 1 Notify RN   Glucose, capillary  Result Value Ref Range   Glucose-Capillary 123 (H) 70 - 99 mg/dL  Glucose, capillary  Result Value Ref Range   Glucose-Capillary 166 (H) 70 - 99 mg/dL  Glucose, capillary  Result Value Ref Range   Glucose-Capillary 132 (H) 70 - 99 mg/dL    Discharge Medications:     Medication List    TAKE these medications        amLODipine 10 MG tablet  Commonly known as:  NORVASC  TAKE 1 TABLET (10 MG TOTAL) BY MOUTH DAILY.     aspirin EC 81 MG tablet  Take 81 mg by mouth daily.     BYSTOLIC 5 MG tablet  Generic drug:  nebivolol  TAKE 1  TABLET BY MOUTH EVERY DAY     calcitRIOL 0.5 MCG capsule  Commonly known as:  ROCALTROL  Take 0.5 mcg by mouth daily.     cholecalciferol 1000 UNITS tablet  Commonly known as:  VITAMIN D  Take 1,000 Units by mouth daily.     finasteride 5 MG tablet  Commonly known as:  PROSCAR  Take 5 mg by mouth daily.     furosemide 80 MG tablet  Commonly known as:  LASIX  Take 80 mg by mouth daily.     insulin detemir 100 UNIT/ML injection  Commonly known as:  LEVEMIR  Inject 0.1 mLs (10 Units total) into the skin at bedtime.     insulin NPH-regular Human (70-30) 100 UNIT/ML injection  Commonly known as:  HUMULIN 70/30  Inject 5 Units into the skin 3 (three) times daily with meals.     Insulin Syringe-Needle U-100 31G X 15/64" 1 ML Misc  Commonly known as:  BD INSULIN SYRINGE ULTRAFINE  1 each by Does not apply route daily.     B-D INS SYR ULTRAFINE 1CC/31G 31G X 5/16" 1 ML Misc  Generic drug:  Insulin Syringe-Needle U-100  daily. for testing     oxyCODONE-acetaminophen 5-325 MG per tablet  Commonly known as:  PERCOCET/ROXICET  Take 1-2 tablets by mouth every 4 (four) hours as needed for moderate pain or severe pain.     pioglitazone 45 MG tablet  Commonly known as:  ACTOS  Take 45 mg by mouth daily.     sodium bicarbonate 650 MG tablet  Take 650 mg by mouth 3 (three) times daily.        Diagnostic Studies: Dg Chest 2 View  05/28/2014   CLINICAL DATA:  Preop for below knee amputation, right foot osteomyelitis is  EXAM: CHEST  2 VIEW  COMPARISON:  04/13/2013  FINDINGS: Cardiomediastinal silhouette is stable. No acute infiltrate or pleural effusion. No pulmonary edema. Minimal degenerative changes lower thoracic spine. Stable linear scarring left base.  IMPRESSION: No active cardiopulmonary disease.   Electronically Signed   By: Phillip Herring M.D.   On: 05/28/2014 13:36    Disposition: 01-Home or Self Care      Discharge Instructions    Call MD / Call 911    Complete by:  As  directed   If you experience chest pain or shortness of breath, CALL 911 and be transported to the hospital emergency room.  If you develope a fever above 101 F, pus (white drainage) or increased drainage  or redness at the wound, or calf pain, call your surgeon's office.     Constipation Prevention    Complete by:  As directed   Drink plenty of fluids.  Prune juice may be helpful.  You may use a stool softener, such as Colace (over the counter) 100 mg twice a day.  Use MiraLax (over the counter) for constipation as needed.     Diet - low sodium heart healthy    Complete by:  As directed      Increase activity slowly as tolerated    Complete by:  As directed               Signed: DEAN,GREGORY SCOTT 06/03/2014, 1:02 PM

## 2014-06-03 NOTE — Progress Notes (Signed)
06/03/14 Patient going to [facility] this afternoon, IV site removed and call placed to [facility] to give report to [place].

## 2014-06-04 ENCOUNTER — Other Ambulatory Visit: Payer: Self-pay

## 2014-06-04 ENCOUNTER — Non-Acute Institutional Stay (SKILLED_NURSING_FACILITY): Payer: Medicare PPO | Admitting: Internal Medicine

## 2014-06-04 ENCOUNTER — Ambulatory Visit: Payer: Medicare PPO | Admitting: Surgery

## 2014-06-04 DIAGNOSIS — I70269 Atherosclerosis of native arteries of extremities with gangrene, unspecified extremity: Secondary | ICD-10-CM

## 2014-06-04 DIAGNOSIS — E1122 Type 2 diabetes mellitus with diabetic chronic kidney disease: Secondary | ICD-10-CM

## 2014-06-04 DIAGNOSIS — R5381 Other malaise: Secondary | ICD-10-CM

## 2014-06-04 DIAGNOSIS — D638 Anemia in other chronic diseases classified elsewhere: Secondary | ICD-10-CM

## 2014-06-04 DIAGNOSIS — N400 Enlarged prostate without lower urinary tract symptoms: Secondary | ICD-10-CM

## 2014-06-04 DIAGNOSIS — I10 Essential (primary) hypertension: Secondary | ICD-10-CM

## 2014-06-04 DIAGNOSIS — N189 Chronic kidney disease, unspecified: Secondary | ICD-10-CM

## 2014-06-04 DIAGNOSIS — N186 End stage renal disease: Secondary | ICD-10-CM

## 2014-06-04 LAB — HEMOGLOBIN A1C: HEMOGLOBIN A1C: 8.8 % — AB (ref 4.0–6.0)

## 2014-06-04 LAB — BASIC METABOLIC PANEL
BUN: 75 mg/dL — AB (ref 4–21)
Creatinine: 3 mg/dL — AB (ref 0.6–1.3)
GLUCOSE: 151 mg/dL
Potassium: 4.3 mmol/L (ref 3.4–5.3)
Sodium: 138 mmol/L (ref 137–147)

## 2014-06-04 LAB — CBC AND DIFFERENTIAL
HCT: 27 % — AB (ref 41–53)
Hemoglobin: 8.4 g/dL — AB (ref 13.5–17.5)
PLATELETS: 234 10*3/uL (ref 150–399)
WBC: 6.4 10^3/mL

## 2014-06-04 MED ORDER — OXYCODONE-ACETAMINOPHEN 5-325 MG PO TABS: 1.0000 | ORAL_TABLET | ORAL | Status: AC | PRN

## 2014-06-04 NOTE — Telephone Encounter (Signed)
Rx faxed to [facility] @ [phone], phone number [phone]  

## 2014-06-04 NOTE — Progress Notes (Signed)
Patient ID: Phillip Herring, male   DOB: August 19, 1940, 74 y.o.   MRN: [id number]     Facility: [facility]    PCP: Karis Juba, PA-C  Code Status: full code  Allergies  Allergen Reactions  . Enalapril Other (See Comments)    Hyperkalemia.  . Metformin And Related Other (See Comments)    Renal insufficiency   . Tekturna [Aliskiren] Other (See Comments)    hyperkalemia    Chief Complaint  Patient presents with  . New Admit To SNF     HPI:  74 year old patient is here for short term rehabilitation post hospital admission from 05/30/14-06/03/14 with right leg infection. He underwent right below knee amputation and is here for rehabilitation. He mentions that his pain is currently under control. He had constipation but had a bowel movement this am. He denies any concerns. He has PMH of CKD, DM, HTN among others  Review of Systems:  Constitutional: Negative for fever, chills, diaphoresis.  HENT: Negative for headache, congestion Respiratory: Negative for cough, shortness of breath and wheezing.   Cardiovascular: Negative for chest pain, palpitations, leg swelling.  Gastrointestinal: Negative for heartburn, nausea, vomiting, abdominal pain Genitourinary: Negative for dysuria and flank pain.  Musculoskeletal: Negative for back pain, falls. On wheelchair Skin: Negative for itching, rash.  Neurological: Negative for dizziness, tingling, focal weakness Psychiatric/Behavioral: Negative for depression and memory loss.    Past Medical History  Diagnosis Date  . Hypertension   . Undescended testicle   . Vitamin D deficiency   . Prostate cancer   . Chronic hyperkalemia 12/23/2012    Sees Renal  . NIDDM (non-insulin dependent diabetes mellitus)   . CKD (chronic kidney disease), stage IV    Past Surgical History  Procedure Laterality Date  . Foot amputation Right 2010    cut off 1/2 my foot"  . Refractive surgery Bilateral 2000's  . Av fistula placement  Left 04/17/2013    Procedure: ARTERIOVENOUS (AV) FISTULA CREATION- LEFT RADIOCEPHALIC;  Surgeon: Rosetta Posner, MD;  Location: [place];  Service: Vascular;  Laterality: Left;  . Shuntogram N/A 08/03/2013    Procedure: Earney Mallet;  Surgeon: Conrad Laramie, MD;  Location: [facility] CATH LAB;  Service: Cardiovascular;  Laterality: N/A;  . Colonoscopy    . Foot amputation Right 05/30/2014    transtibial  . Amputation Right 05/30/2014    Procedure: AMPUTATION BELOW KNEE;  Surgeon: Newt Minion, MD;  Location: [place];  Service: Orthopedics;  Laterality: Right;   Social History:   reports that he quit smoking about 31 years ago. His smoking use included Cigarettes. He has a 25 pack-year smoking history. He has never used smokeless tobacco. He reports that he does not drink alcohol or use illicit drugs.  Family History  Problem Relation Age of Onset  . Cancer Sister     Medications: Patient's Medications  New Prescriptions   No medications on file  Previous Medications   AMLODIPINE (NORVASC) 10 MG TABLET    TAKE 1 TABLET (10 MG TOTAL) BY MOUTH DAILY.   ASPIRIN EC 81 MG TABLET    Take 81 mg by mouth daily.   B-D INS SYR ULTRAFINE 1CC/31G 31G X 5/16" 1 ML MISC    daily. for testing   BYSTOLIC 5 MG TABLET    TAKE 1 TABLET BY MOUTH EVERY DAY   CALCITRIOL (ROCALTROL) 0.5 MCG CAPSULE    Take 0.5 mcg by mouth daily.   CHOLECALCIFEROL (VITAMIN D) 1000  UNITS TABLET    Take 1,000 Units by mouth daily.   FINASTERIDE (PROSCAR) 5 MG TABLET    Take 5 mg by mouth daily.     FUROSEMIDE (LASIX) 80 MG TABLET    Take 80 mg by mouth daily.    INSULIN DETEMIR (LEVEMIR) 100 UNIT/ML INJECTION    Inject 0.1 mLs (10 Units total) into the skin at bedtime.   INSULIN NPH-REGULAR HUMAN (HUMULIN 70/30) (70-30) 100 UNIT/ML INJECTION    Inject 5 Units into the skin 3 (three) times daily with meals.   INSULIN SYRINGE-NEEDLE U-100 (BD INSULIN SYRINGE ULTRAFINE) 31G X 15/64" 1 ML MISC    1 each by Does not apply route daily.    OXYCODONE-ACETAMINOPHEN (PERCOCET/ROXICET) 5-325 MG PER TABLET    Take 1-2 tablets by mouth every 4 (four) hours as needed for moderate pain or severe pain.   PIOGLITAZONE (ACTOS) 45 MG TABLET    Take 45 mg by mouth daily.   SODIUM BICARBONATE 650 MG TABLET    Take 650 mg by mouth 3 (three) times daily.   Modified Medications   No medications on file  Discontinued Medications   No medications on file     Physical Exam: Filed Vitals:   06/04/14 1445  BP: 138/86  Pulse: 85  Temp: 98.3 F (36.8 C)  Resp: 18  SpO2: 96%    General- elderly male in no acute distress Head- normocephalic, atraumatic Throat- moist mucus membrane Neck- no cervical lymphadenopathy Cardiovascular- normal s1,s2, no murmurs, palpable left dorsalis pedis Respiratory- bilateral clear to auscultation, no wheeze, no rhonchi, no crackles, no use of accessory muscles Abdomen- bowel sounds present, soft, non tender Musculoskeletal- able to move all 4 extremities, right BKA, 27 staples in place with mild erythema at incision site, no drainage or exudates noted, has a fistula in left arm with good thrill Neurological- no focal deficit Skin- warm and dry, dressing clean and dry Psychiatry- alert and oriented to person, place and time, normal mood and affect   Labs reviewed: Basic Metabolic Panel:  Recent Labs  11/06/13 0856 05/16/14 0923 05/28/14 1240  NA 145 139 140  K 4.5 4.3 3.6  CL 109 101 106  CO2 25 26 24   GLUCOSE 91 137* 232*  BUN 56* 57* 52*  CREATININE 2.90* 2.89* 3.19*  CALCIUM 9.3 9.1 8.8   Liver Function Tests:  Recent Labs  11/06/13 0856 05/16/14 0923 05/28/14 1240  AST 16 12 21   ALT 12 8 11   ALKPHOS 67 47 50  BILITOT 0.4 0.4 0.4  PROT 7.6 7.7 7.2  ALBUMIN 4.2 3.7 3.3*   No results for input(s): LIPASE, AMYLASE in the last 8760 hours. No results for input(s): AMMONIA in the last 8760 hours. CBC:  Recent Labs  08/03/13 0741 05/28/14 1240  WBC  --  6.8  HGB 13.3 10.0*    HCT 39.0 31.6*  MCV  --  91.3  PLT  --  205   Cardiac Enzymes: No results for input(s): CKTOTAL, CKMB, CKMBINDEX, TROPONINI in the last 8760 hours. BNP: Invalid input(s): POCBNP CBG:  Recent Labs  06/02/14 2257 06/03/14 0758 06/03/14 1152  GLUCAP 123* 166* 132*    Assessment/Plan  Physical deconditioning Will have him work with physical therapy and occupational therapy team to help with gait training and muscle strengthening exercises.fall precautions. Skin care. Encourage to be out of bed.   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 Pain currently under control. Continue percocet 5-325  1 tab q4h prn for now. Continue aspirin 81 mg daily  HTN Stable bp at present. Continue norvasc 10 mg daily, bystolic 5 mg daily and lasix 80 mg daily  ESRD has fistula in place,currently not started on dialysis. Monitor bmp. Continue calcitriol  Anemia likley a mixture of post op blood loss and CKD. Monitor h&h  BPH Continue proscar 5 mg daily  DM with renal complications Monitor cbg, continue levemir 10 u daily and novolog 5 u with meals and actos 45 mg daily. Continue aspirin   Goals of care: short term rehabilitation    Labs/tests ordered: cbc, cmp in 1 week  Family/ staff Communication: reviewed care plan with patient and nursing supervisor    Blanchie Serve, MD  [place] [phone] (Monday-Friday 8 am - 5 pm) [phone] (afterhours)

## 2014-06-11 LAB — BASIC METABOLIC PANEL WITH GFR
BUN: 75 mg/dL — AB (ref 4–21)
Creatinine: 3 mg/dL — AB (ref 0.6–1.3)
Glucose: 151 mg/dL
Potassium: 4.3 mmol/L (ref 3.4–5.3)
Sodium: 139 mmol/L (ref 137–147)

## 2014-06-20 ENCOUNTER — Encounter: Payer: Self-pay | Admitting: Registered Nurse

## 2014-06-20 ENCOUNTER — Non-Acute Institutional Stay (SKILLED_NURSING_FACILITY): Payer: Medicare PPO | Admitting: Registered Nurse

## 2014-06-20 DIAGNOSIS — E1140 Type 2 diabetes mellitus with diabetic neuropathy, unspecified: Secondary | ICD-10-CM

## 2014-06-20 DIAGNOSIS — R600 Localized edema: Secondary | ICD-10-CM

## 2014-06-20 NOTE — Progress Notes (Signed)
Patient ID: Phillip Herring, male   DOB: [birth year], 74 y.o.   MRN: [id number]   Place of Service: [facility]  Allergies  Allergen Reactions  . Enalapril Other (See Comments)    Hyperkalemia.  . Metformin And Related Other (See Comments)    Renal insufficiency   . Tekturna [Aliskiren] Other (See Comments)    hyperkalemia    Code Status: Full Code  Goals of Care: Longevity/STR  Chief Complaint  Patient presents with  . Acute Visit    LLE swelling with numbness/tingling    HPI  74 y.o. male with PMH of uncontrolled DM2, s/p right BKA, HTN, prostate ca, CKD with existing AVF among others is being seen for an acute visit at the request of PT and nursing staff for the evaluation of numbness/tingling of left foot and  increased swelling of LLE despite TED hose and elevation of foot. Seen in room today. Patient reported having numbness and tingling in his left foot, especially with standing/walking. He is concerned about the swelling in his LLE as he felt it is worse now. No other concerns reported.   Review of Systems Constitutional: Negative for fever and chills HENT: Negative for congestion and sore throat Eyes: Negative for eye pain, eye discharge, and visual disturbance  Cardiovascular: Negative for chest pain, palpitations. Positive for LLE swelling Respiratory: Negative cough, shortness of breath, and wheezing.  Gastrointestinal: Negative for nausea and vomiting. Negative for abdominal pain, diarrhea and constipation.  Genitourinary: Negative for dysuria Musculoskeletal: Negative for back pain, joint pain, and joint swelling  Neurological: Negative for dizziness and headache. Positive for numbness and tingling of Left foot Skin: Negative for rash and pruritus  Psychiatric: Negative for depression  Past Medical History  Diagnosis Date  . Hypertension   . Undescended testicle   . Vitamin D deficiency   . Prostate cancer   . Chronic hyperkalemia 12/23/2012    Sees  Renal  . NIDDM (non-insulin dependent diabetes mellitus)   . CKD (chronic kidney disease), stage IV     Past Surgical History  Procedure Laterality Date  . Foot amputation Right 2010    cut off 1/2 my foot"  . Refractive surgery Bilateral 2000's  . Av fistula placement Left 04/17/2013    Procedure: ARTERIOVENOUS (AV) FISTULA CREATION- LEFT RADIOCEPHALIC;  Surgeon: Rosetta Posner, MD;  Location: [place];  Service: Vascular;  Laterality: Left;  . Shuntogram N/A 08/03/2013    Procedure: Earney Mallet;  Surgeon: Conrad Cuyama, MD;  Location: [facility] CATH LAB;  Service: Cardiovascular;  Laterality: N/A;  . Colonoscopy    . Foot amputation Right 05/30/2014    transtibial  . Amputation Right 05/30/2014    Procedure: AMPUTATION BELOW KNEE;  Surgeon: Newt Minion, MD;  Location: [place];  Service: Orthopedics;  Laterality: Right;    History   Social History  . Marital Status: Divorced    Spouse Name: N/A    Number of Children: N/A  . Years of Education: N/A   Occupational History  . Not on file.   Social History Main Topics  . Smoking status: Former Smoker -- 1.00 packs/day for 25 years    Types: Cigarettes    Quit date: 10/28/1982  . Smokeless tobacco: Never Used  . Alcohol Use: No  . Drug Use: No  . Sexual Activity: No   Other Topics Concern  . Not on file   Social History Narrative      Medication List  This list is accurate as of: 06/20/14  5:03 PM.  Always use your most recent med list.               amLODipine 10 MG tablet  Commonly known as:  NORVASC  TAKE 1 TABLET (10 MG TOTAL) BY MOUTH DAILY.     aspirin EC 81 MG tablet  Take 81 mg by mouth daily.     BYSTOLIC 5 MG tablet  Generic drug:  nebivolol  TAKE 1 TABLET BY MOUTH EVERY DAY     calcitRIOL 0.5 MCG capsule  Commonly known as:  ROCALTROL  Take 0.5 mcg by mouth daily.     cholecalciferol 1000 UNITS tablet  Commonly known as:  VITAMIN D  Take 1,000 Units by mouth daily.     finasteride 5 MG tablet    Commonly known as:  PROSCAR  Take 5 mg by mouth daily.     furosemide 80 MG tablet  Commonly known as:  LASIX  Take 80 mg by mouth daily.     gabapentin 100 MG capsule  Commonly known as:  NEURONTIN  Take 100 mg by mouth at bedtime.     insulin detemir 100 UNIT/ML injection  Commonly known as:  LEVEMIR  Inject 0.1 mLs (10 Units total) into the skin at bedtime.     insulin NPH-regular Human (70-30) 100 UNIT/ML injection  Commonly known as:  HUMULIN 70/30  Inject 5 Units into the skin 3 (three) times daily with meals.     Insulin Syringe-Needle U-100 31G X 15/64" 1 ML Misc  Commonly known as:  BD INSULIN SYRINGE ULTRAFINE  1 each by Does not apply route daily.     B-D INS SYR ULTRAFINE 1CC/31G 31G X 5/16" 1 ML Misc  Generic drug:  Insulin Syringe-Needle U-100  daily. for testing     oxyCODONE-acetaminophen 5-325 MG per tablet  Commonly known as:  PERCOCET/ROXICET  Take 1-2 tablets by mouth every 4 (four) hours as needed for moderate pain or severe pain.     pioglitazone 45 MG tablet  Commonly known as:  ACTOS  Take 45 mg by mouth daily.        Physical Exam  BP 138/61 mmHg  Pulse 66  Temp(Src) 97.1 F (36.2 C)  Resp 18  Constitutional: WDWN elderly male in no acute distress. Conversant and pleasant HEENT: Normocephalic and atraumatic. PERRL. EOM intact. No icterus. Oral mucosa moist. Posterior pharynx clear of any exudate or lesions.  Neck: Supple and nontender. No lymphadenopathy, masses, or thyromegaly. No JVD or carotid bruits. Cardiac: Normal S1, S2. RRR without appreciable murmurs, rubs, or gallops. Left pedal/posterior tibialis pulse intact. 1+ pitting edema of LLE. Lungs: No respiratory distress. Breath sounds clear bilaterally without rales, rhonchi, or wheezes. Abdomen: Audible bowel sounds in all quadrants. Soft, nontender, nondistended.  Musculoskeletal: able to move all extremities. S/p right BKA. Left wrist AVF in place with positive bruit and thrill.  LLE nontender to palpation. Skin: Warm and dry. No rash noted. No erythema.  Neurological: Alert and oriented to person, place, and time. No focal deficits.  Psychiatric: Judgment and insight adequate. Appropriate mood and affect.   Labs Reviewed  CBC Latest Ref Rng 06/04/2014 05/28/2014 08/03/2013  WBC - 6.4 6.8 -  Hemoglobin 13.5 - 17.5 g/dL 8.4(A) 10.0(L) 13.3  Hematocrit 41 - 53 % 27(A) 31.6(L) 39.0  Platelets 150 - 399 K/L 234 205 -     CMP Latest Ref Rng 06/04/2014 05/28/2014 05/16/2014  Glucose 70 -  99 mg/dL - 232(H) 137(H)  BUN 4 - 21 mg/dL 75(A) 52(H) 57(H)  Creatinine 0.6 - 1.3 mg/dL 3.0(A) 3.19(H) 2.89(H)  Sodium 137 - 147 mmol/L 138 140 139  Potassium 3.4 - 5.3 mmol/L 4.3 3.6 4.3  Chloride 96 - 112 mEq/L - 106 101  CO2 19 - 32 mmol/L - 24 26  Calcium 8.4 - 10.5 mg/dL - 8.8 9.1  Total Protein 6.0 - 8.3 g/dL - 7.2 7.7  Total Bilirubin 0.3 - 1.2 mg/dL - 0.4 0.4  Alkaline Phos 39 - 117 U/L - 50 47  AST 0 - 37 U/L - 21 12  ALT 0 - 53 U/L - 11 8   Assessment & Plan 1. Leg edema, left Stable. No significant change since previous visit. Continue TED hose to LLE and elevate foot while in bed. Reassure patient that it is most likely secondary to venous insufficiency and sodium bicarb intake. Will discontinue sodium bicarb for now. Continue lasix 80mg  daily and continue to monitor his status  2. Diabetic neuropathy, type II diabetes mellitus Start gabapentin 100mg  daily at bedtime. Reassess and continue to monitor.    Family/Staff Communication Plan of care discussed with resident and nursing staff. Resident and nursing staff verbalized understanding and agree with plan of care. No additional questions or concerns reported.    Arthur Holms, MSN, AGNP-C [facility] [address] [place], [place] [address] [phone] [8am-5pm] After hours: [phone]

## 2014-06-28 ENCOUNTER — Non-Acute Institutional Stay (SKILLED_NURSING_FACILITY): Payer: Medicare PPO | Admitting: Registered Nurse

## 2014-06-28 ENCOUNTER — Encounter: Payer: Self-pay | Admitting: Registered Nurse

## 2014-06-28 DIAGNOSIS — E118 Type 2 diabetes mellitus with unspecified complications: Secondary | ICD-10-CM

## 2014-06-28 DIAGNOSIS — I10 Essential (primary) hypertension: Secondary | ICD-10-CM

## 2014-06-28 DIAGNOSIS — I70269 Atherosclerosis of native arteries of extremities with gangrene, unspecified extremity: Secondary | ICD-10-CM

## 2014-06-28 DIAGNOSIS — N400 Enlarged prostate without lower urinary tract symptoms: Secondary | ICD-10-CM

## 2014-06-28 DIAGNOSIS — D638 Anemia in other chronic diseases classified elsewhere: Secondary | ICD-10-CM

## 2014-06-28 DIAGNOSIS — N186 End stage renal disease: Secondary | ICD-10-CM

## 2014-06-28 NOTE — Progress Notes (Signed)
Patient ID: Phillip Herring, male   DOB: 03-Apr-1941, 74 y.o.   MRN: [id number]   Place of Service: [facility]  Allergies  Allergen Reactions  . Enalapril Other (See Comments)    Hyperkalemia.  . Metformin And Related Other (See Comments)    Renal insufficiency   . Tekturna [Aliskiren] Other (See Comments)    hyperkalemia    Code Status: Full Code  Goals of Care: Longevity/STR  Chief Complaint  Patient presents with  . Discharge Note    HPI 74 y.o. male with PMH of HTN, CKD, chronic hyperkalemia, vitamin D deficiency among others is being seen for a discharge visit. Patient was here for short-term rehabilitation post hospital admission from 05/30/14 to 06/03/14 with right leg infection s/p right below knee amputation. Patient has worked well with therapy team and is ready to be discharged home with [facility] PT/OT/Aide and DME (3-1). Seen in room today. No concerns verbalized by patient   Review of Systems Constitutional: Negative for fever and chills HENT: Negative for congestion and sore throat  Eyes: Negative for eye pain, eye discharge, and visual disturbance  Cardiovascular: Negative for chest pain, palpitations. Positive for LLE swelling  Respiratory: Negative cough, shortness of breath, and wheezing.  Gastrointestinal: Negative for nausea and vomiting. Negative for abdominal pain, diarrhea and constipation.  Genitourinary: Negative for dysuria  Musculoskeletal: Negative for back pain, joint pain, and joint swelling  Neurological: Negative for dizziness and headache. Positive for numbness and tingling of Left foot  Skin: Negative for rash and pruritus  Psychiatric: Negative for depression  Past Medical History  Diagnosis Date  . Hypertension   . Undescended testicle   . Vitamin D deficiency   . Prostate cancer   . Chronic hyperkalemia 12/23/2012    Sees Renal  . NIDDM (non-insulin dependent diabetes mellitus)   . CKD (chronic kidney disease), stage IV     Past  Surgical History  Procedure Laterality Date  . Foot amputation Right 2010    cut off 1/2 my foot"  . Refractive surgery Bilateral 2000's  . Av fistula placement Left 04/17/2013    Procedure: ARTERIOVENOUS (AV) FISTULA CREATION- LEFT RADIOCEPHALIC;  Surgeon: Rosetta Posner, MD;  Location: [place];  Service: Vascular;  Laterality: Left;  . Shuntogram N/A 08/03/2013    Procedure: Earney Mallet;  Surgeon: Conrad , MD;  Location: [facility] CATH LAB;  Service: Cardiovascular;  Laterality: N/A;  . Colonoscopy    . Foot amputation Right 05/30/2014    transtibial  . Amputation Right 05/30/2014    Procedure: AMPUTATION BELOW KNEE;  Surgeon: Newt Minion, MD;  Location: [place];  Service: Orthopedics;  Laterality: Right;    History   Social History  . Marital Status: Divorced    Spouse Name: N/A    Number of Children: N/A  . Years of Education: N/A   Occupational History  . Not on file.   Social History Main Topics  . Smoking status: Former Smoker -- 1.00 packs/day for 25 years    Types: Cigarettes    Quit date: 10/28/1982  . Smokeless tobacco: Never Used  . Alcohol Use: No  . Drug Use: No  . Sexual Activity: No   Other Topics Concern  . Not on file   Social History Narrative    Family History  Problem Relation Age of Onset  . Cancer Sister       Medication List       This list is accurate as of:  06/28/14  6:18 PM.  Always use your most recent med list.               amLODipine 10 MG tablet  Commonly known as:  NORVASC  TAKE 1 TABLET (10 MG TOTAL) BY MOUTH DAILY.     aspirin EC 81 MG tablet  Take 81 mg by mouth daily.     BYSTOLIC 5 MG tablet  Generic drug:  nebivolol  TAKE 1 TABLET BY MOUTH EVERY DAY     calcitRIOL 0.5 MCG capsule  Commonly known as:  ROCALTROL  Take 0.5 mcg by mouth daily.     cholecalciferol 1000 UNITS tablet  Commonly known as:  VITAMIN D  Take 1,000 Units by mouth daily.     finasteride 5 MG tablet  Commonly known as:  PROSCAR  Take 5 mg by  mouth daily.     furosemide 80 MG tablet  Commonly known as:  LASIX  Take 80 mg by mouth daily.     gabapentin 100 MG capsule  Commonly known as:  NEURONTIN  Take 100 mg by mouth at bedtime.     insulin detemir 100 UNIT/ML injection  Commonly known as:  LEVEMIR  Inject 0.1 mLs (10 Units total) into the skin at bedtime.     insulin NPH-regular Human (70-30) 100 UNIT/ML injection  Commonly known as:  NOVOLIN 70/30  Inject 8 Units into the skin 3 (three) times daily with meals.     Insulin Syringe-Needle U-100 31G X 15/64" 1 ML Misc  Commonly known as:  BD INSULIN SYRINGE ULTRAFINE  1 each by Does not apply route daily.     B-D INS SYR ULTRAFINE 1CC/31G 31G X 5/16" 1 ML Misc  Generic drug:  Insulin Syringe-Needle U-100  daily. for testing     linagliptin 5 MG Tabs tablet  Commonly known as:  TRADJENTA  Take 5 mg by mouth daily.     oxyCODONE-acetaminophen 5-325 MG per tablet  Commonly known as:  PERCOCET/ROXICET  Take 1-2 tablets by mouth every 4 (four) hours as needed for moderate pain or severe pain.     pioglitazone 45 MG tablet  Commonly known as:  ACTOS  Take 45 mg by mouth daily.        Physical Exam  BP 119/62 mmHg  Pulse 55  Temp(Src) 97.5 F (36.4 C)  Resp 19  Ht 6' (1.829 m)  Wt 214 lb 12.8 oz (97.433 kg)  BMI 29.13 kg/m2  Constitutional: WDWN elderly male in no acute distress. Conversant and pleasant  HEENT: Normocephalic and atraumatic. PERRL. EOM intact. No icterus. Oral mucosa moist. Posterior pharynx clear of any exudate or lesions.  Neck: Supple and nontender. No lymphadenopathy, masses, or thyromegaly. No JVD or carotid bruits.  Cardiac: Normal S1, S2. RRR without appreciable murmurs, rubs, or gallops. Left pedal/posterior tibialis pulse diminished. 1+ pitting edema of LLE.  Lungs: No respiratory distress. Breath sounds clear bilaterally without rales, rhonchi, or wheezes.  Abdomen: Audible bowel sounds in all quadrants. Soft, nontender,  nondistended.  Musculoskeletal: able to move all extremities. S/p right BKA-surgical incision w/o signs of infection. Shrinker in place. Left wrist AVF in place with positive bruit and thrill.  Skin: Warm and dry. No rash noted. No erythema.  Neurological: Alert and oriented to person, place, and time. No focal deficits.  Psychiatric: Judgment and insight adequate. Appropriate mood and affect.   Labs Reviewed  CBC Latest Ref Rng 06/04/2014 05/28/2014 08/03/2013  WBC - 6.4 6.8 -  Hemoglobin 13.5 - 17.5 g/dL 8.4(A) 10.0(L) 13.3  Hematocrit 41 - 53 % 27(A) 31.6(L) 39.0  Platelets 150 - 399 K/L 234 205 -    CMP Latest Ref Rng 06/11/2014 06/04/2014 05/28/2014  Glucose 70 - 99 mg/dL - - 232(H)  BUN 4 - 21 mg/dL 75(A) 75(A) 52(H)  Creatinine 0.6 - 1.3 mg/dL 3.0(A) 3.0(A) 3.19(H)  Sodium 137 - 147 mmol/L 139 138 140  Potassium 3.4 - 5.3 mmol/L 4.3 4.3 3.6  Chloride 96 - 112 mEq/L - - 106  CO2 19 - 32 mmol/L - - 24  Calcium 8.4 - 10.5 mg/dL - - 8.8  Total Protein 6.0 - 8.3 g/dL - - 7.2  Total Bilirubin 0.3 - 1.2 mg/dL - - 0.4  Alkaline Phos 39 - 117 U/L - - 50  AST 0 - 37 U/L - - 21  ALT 0 - 53 U/L - - 11    Assessment & Plan 1. Essential hypertension Stable.  Continue bystolic 5mg  daily, amlodipine 10mg  daily, and lasix 80mg  twice daily. F/u with pcp  2. Diabetes mellitus type 2 with complications Stable. CBG readings improve with recent change in diabetic meds with most reading in 130-150s. Continue levemir 10 units daily at bedtime, Novolin 70/30 8 units three times daily with meals, actos 45mg  daily, and trajenta 5mg  daily. Continue gabapentin 100mg  daily at bedtime for neuropathy. Continue aspirin. F/u with pcp.   3. BPH (benign prostatic hyperplasia) No issues. Continue proscar 5mg  daily   4. Anemia of chronic disease Most likely a combo of postop and CKD. PCP to monitor h&h  5. ESRD (end stage renal disease) Has AVF in place on left wrist but has not started on dialysis. Continue  calcitriol 0.38mcg daily. F/u with nephrology for renal concerns  6. Atherosclerosis of native arteries of the extremities with gangrene S/p Right BKA. Surgical incisions w/o signs of infection. Continue to work [facility] PT/OT for gait/strength/balance training to maximize function. Fall risk precautions. Continue aspirin 81mg  daily and Percocet 5/325mg  every four hours as needed for pain.   Home health services:PT/OT and Aide DME required: 3-1 PCP follow-up: Dr. Dennard Schaumann on 07/05/14 @ 2pm 30-day supply of prescription medications provided (#30 Percocet 5/325mg )  Family/Staff Communication Plan of care discussed with patient and nursing staff. Patient and nursing staff verbalized understanding and agree with plan of care. No additional questions or concerns reported.    Arthur Holms, MSN, AGNP-C [facility] [address] [place], [place] [address] [phone] [8am-5pm] After hours: [phone]

## 2014-07-01 DIAGNOSIS — Z4781 Encounter for orthopedic aftercare following surgical amputation: Secondary | ICD-10-CM | POA: Diagnosis not present

## 2014-07-01 DIAGNOSIS — E119 Type 2 diabetes mellitus without complications: Secondary | ICD-10-CM | POA: Diagnosis not present

## 2014-07-01 DIAGNOSIS — I120 Hypertensive chronic kidney disease with stage 5 chronic kidney disease or end stage renal disease: Secondary | ICD-10-CM | POA: Diagnosis not present

## 2014-07-05 ENCOUNTER — Ambulatory Visit (INDEPENDENT_AMBULATORY_CARE_PROVIDER_SITE_OTHER): Payer: Medicare PPO | Admitting: Physician Assistant

## 2014-07-05 ENCOUNTER — Encounter: Payer: Self-pay | Admitting: Physician Assistant

## 2014-07-05 VITALS — BP 136/76 | HR 76 | Temp 98.2°F | Resp 18

## 2014-07-05 DIAGNOSIS — I10 Essential (primary) hypertension: Secondary | ICD-10-CM

## 2014-07-05 DIAGNOSIS — N186 End stage renal disease: Secondary | ICD-10-CM

## 2014-07-05 DIAGNOSIS — N184 Chronic kidney disease, stage 4 (severe): Secondary | ICD-10-CM

## 2014-07-05 DIAGNOSIS — Z89511 Acquired absence of right leg below knee: Secondary | ICD-10-CM

## 2014-07-05 DIAGNOSIS — E118 Type 2 diabetes mellitus with unspecified complications: Secondary | ICD-10-CM

## 2014-07-05 NOTE — Progress Notes (Signed)
Patient ID: BRECK MARYLAND MRN: [id number], DOB: 25-Sep-1940, 74 y.o. Date of Encounter: @DATE @  Chief Complaint:  Chief Complaint  Patient presents with  . hosp/rehab followup    HPI: 74 y.o. year old male  presents for follow-up visit after recent surgery, hospitalization and rehabilitation stay.  He had recent office visit with me at which time he had noticed a sore on the bottom of his previous amputation stump. At that time we got him in with the wound center and he subsequently was seen by Dr. Sharol Given. Developed osteomyelitis. Required further amputation. He had this surgery on 05/30/14.Right Below the knee Amputation.  Thankfully, he made it through the surgery and hospitalization without any complications. He then went to therapy as well. Says that he just went home this past Saturday.  At his last visit with me 05/28/14 he was taking Levemir 10 units daily at bedtime and was using mealtime insulin 5 units 3 times a day with meals. He is currently doing Levemir 10 units every night but then doing 8 units mealtime insulin 3 times a day with meals. He also points out Tradjenta and states that this was added new. He reports that he is checking his blood sugars and is getting good readings at all times and is not having any lows.  I reviewed notes from [facility] in epic. In rehabilitation he had PT, OT, gait training, muscle strengthening exercises. He was being discharged home with arrangements for physical therapy, occupational therapy, aide. He says that he was told he will continue this for 4 or 5 weeks. Says that once his amputation site heals well he will then get a prosthetic and then he will have more therapy after he gets the prosthetic.  Says that he has gone and gotten his staples out. Next appointment with Dr. Sharol Given is next Wednesday.  He is getting ready to go to urology for his usual routine prostate check up.  Says that he has not yet seen Dr. Jamal Maes  since his hospitalization. Says that his sodium bicarbonate was stopped and he isn't sure about this. He is going to go by Dr. Sanda Klein office right now and discuss this with them to make sure that his renal medications are being taken properly.  He states that both his sister and his brother brought him here to his appointment today.  He says he's been doing really well and feels good and is in good spirits today. Says that he is going to follow-up with Dr. Baird Cancer his retina specialist to make sure the diabetes doesn't mess up his eyes as well.    Past Medical History  Diagnosis Date  . Hypertension   . Undescended testicle   . Vitamin D deficiency   . Prostate cancer   . Chronic hyperkalemia 12/23/2012    Sees Renal  . NIDDM (non-insulin dependent diabetes mellitus)   . CKD (chronic kidney disease), stage IV      Home Meds: Outpatient Prescriptions Prior to Visit  Medication Sig Dispense Refill  . amLODipine (NORVASC) 10 MG tablet TAKE 1 TABLET (10 MG TOTAL) BY MOUTH DAILY. 30 tablet 5  . BYSTOLIC 5 MG tablet TAKE 1 TABLET BY MOUTH EVERY DAY 30 tablet 5  . calcitRIOL (ROCALTROL) 0.5 MCG capsule Take 0.5 mcg by mouth daily.    . cholecalciferol (VITAMIN D) 1000 UNITS tablet Take 1,000 Units by mouth daily.    . finasteride (PROSCAR) 5 MG tablet Take 5 mg by mouth  daily.      . furosemide (LASIX) 80 MG tablet Take 80 mg by mouth daily.     Marland Kitchen gabapentin (NEURONTIN) 100 MG capsule Take 100 mg by mouth at bedtime.    . insulin detemir (LEVEMIR) 100 UNIT/ML injection Inject 0.1 mLs (10 Units total) into the skin at bedtime. 10 mL 3  . insulin NPH-regular Human (NOVOLIN 70/30) (70-30) 100 UNIT/ML injection Inject 8 Units into the skin 3 (three) times daily with meals.    Marland Kitchen linagliptin (TRADJENTA) 5 MG TABS tablet Take 5 mg by mouth daily.    Marland Kitchen oxyCODONE-acetaminophen (PERCOCET/ROXICET) 5-325 MG per tablet Take 1-2 tablets by mouth every 4 (four) hours as needed for moderate pain or severe  pain. 180 tablet 0  . pioglitazone (ACTOS) 45 MG tablet Take 45 mg by mouth daily.    . Insulin Syringe-Needle U-100 (BD INSULIN SYRINGE ULTRAFINE) 31G X 15/64" 1 ML MISC 1 each by Does not apply route daily. 100 each 6  . aspirin EC 81 MG tablet Take 81 mg by mouth daily.    . B-D INS SYR ULTRAFINE 1CC/31G 31G X 5/16" 1 ML MISC daily. for testing  99   No facility-administered medications prior to visit.    Allergies:  Allergies  Allergen Reactions  . Enalapril Other (See Comments)    Hyperkalemia.  . Metformin And Related Other (See Comments)    Renal insufficiency   . Tekturna [Aliskiren] Other (See Comments)    hyperkalemia    History   Social History  . Marital Status: Divorced    Spouse Name: N/A  . Number of Children: N/A  . Years of Education: N/A   Occupational History  . Not on file.   Social History Main Topics  . Smoking status: Former Smoker -- 1.00 packs/day for 25 years    Types: Cigarettes    Quit date: 10/28/1982  . Smokeless tobacco: Never Used  . Alcohol Use: No  . Drug Use: No  . Sexual Activity: No   Other Topics Concern  . Not on file   Social History Narrative    Family History  Problem Relation Age of Onset  . Cancer Sister      Review of Systems:  See HPI for pertinent ROS. All other ROS negative.    Physical Exam: Blood pressure 136/76, pulse 76, temperature 98.2 F (36.8 C), temperature source Oral, resp. rate 18, weight 0 lb (0 kg)., Body mass index is 0.00 kg/(m^2). General: WNWD WM. Appears in no acute distress. Sitting in wheelchair.  Neck: Supple. No thyromegaly. No lymphadenopathy. Lungs: Clear bilaterally to auscultation without wheezes, rales, or rhonchi. Breathing is unlabored. Heart: RRR with S1 S2. No murmurs, rubs, or gallops. Musculoskeletal:  Strength and tone normal for age. Right BKA--Site warpped.  Extremities/Skin: Warm and dry.  Neuro: Alert and oriented X 3. Moves all extremities spontaneously. Gait is  normal. CNII-XII grossly in tact. Psych:  Responds to questions appropriately with a normal affect.     ASSESSMENT AND PLAN:  74 y.o. year old male with  1. S/P BKA (below knee amputation) unilateral, right   2. Diabetes mellitus type 2 with complications   3. CKD (chronic kidney disease), stage 4 (severe)   4. End stage renal disease   5. Essential hypertension  06/04/14 A1c was 8.8. No point in rechecking A1c yet.   he is checking his sugars routinely and getting good readings. Told him to call me if starts getting any low readings or  uncontrolled readings. 06/04/14 he had a CBC. 06/11/14 he had a bmet.  Will not repeat any labs as it is only been less than one month since all these labs were done. He is to follow-up with Dr. Lorrene Reid immediately regarding his renal medications especially Bi carb. Otherwise continue current medications and follow-up with me in a couple months. Follow up sooner if needed.   [address] [place], [place], [facility] 07/05/2014 2:35 PM

## 2014-07-24 ENCOUNTER — Telehealth: Payer: Self-pay | Admitting: Family Medicine

## 2014-07-24 MED ORDER — ALCOHOL PREPS PADS: 1.0000 | MEDICATED_PAD | Freq: Three times a day (TID) | Status: DC

## 2014-07-24 MED ORDER — ACCU-CHEK SOFTCLIX LANCETS MISC: 1.0000 | Freq: Three times a day (TID) | Status: DC

## 2014-07-24 MED ORDER — BLOOD GLUCOSE MONITOR KIT
PACK | Status: DC
Start: 2014-07-24 — End: 2014-08-07

## 2014-07-24 MED ORDER — GLUCOSE BLOOD VI STRP: 1.0000 | ORAL_STRIP | Freq: Three times a day (TID) | Status: DC

## 2014-07-24 MED ORDER — SURECHEK CONTROL SOLUTION NORMAL VI LIQD
Status: DC
Start: 2014-07-24 — End: 2014-08-07

## 2014-07-24 NOTE — Telephone Encounter (Signed)
Diabetic supply RX's printed and faxed to [place] at number on request form

## 2014-07-26 IMAGING — US US RENAL
1 series · 14 of 25 positions shown · non-contrast
Comparison: None.

CLINICAL DATA: Stage III chronic kidney disease.

RENAL/URINARY TRACT ULTRASOUND COMPLETE

[Series 1: us renal · 0.33mm/px · 14 of 25 slices shown]
[im 1/25]
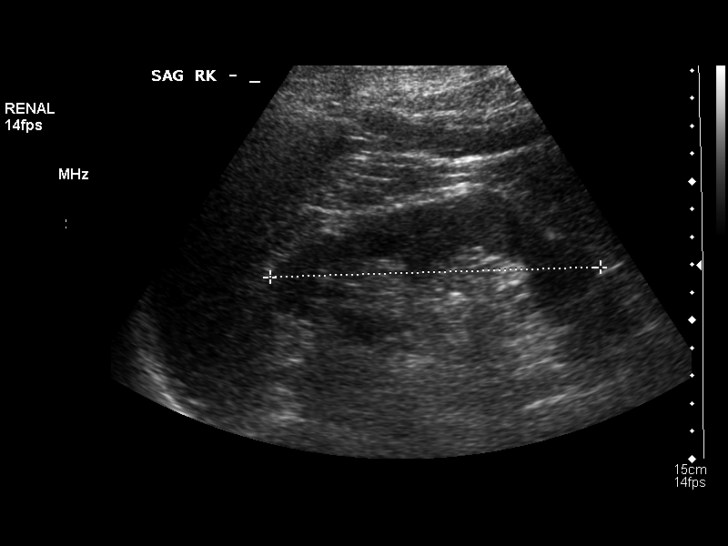
[im 3/25]
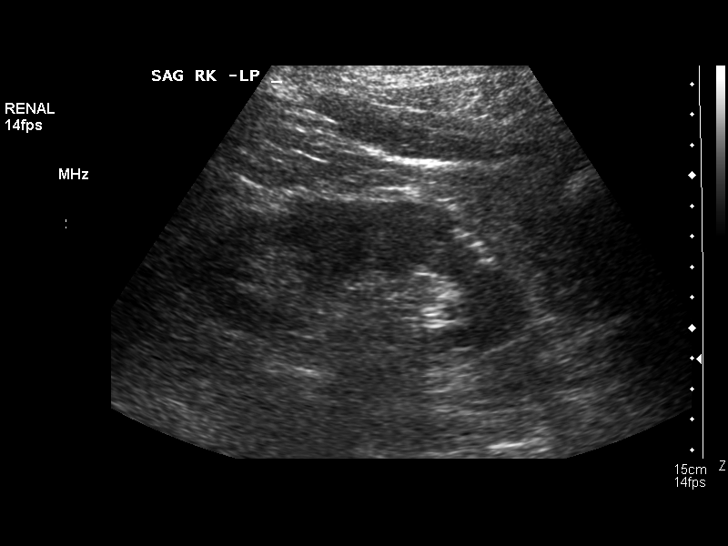
[im 5/25]
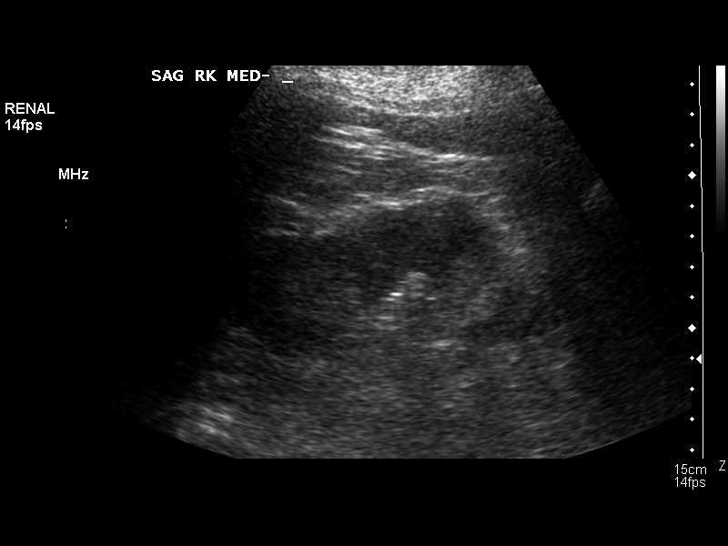
[im 7/25]
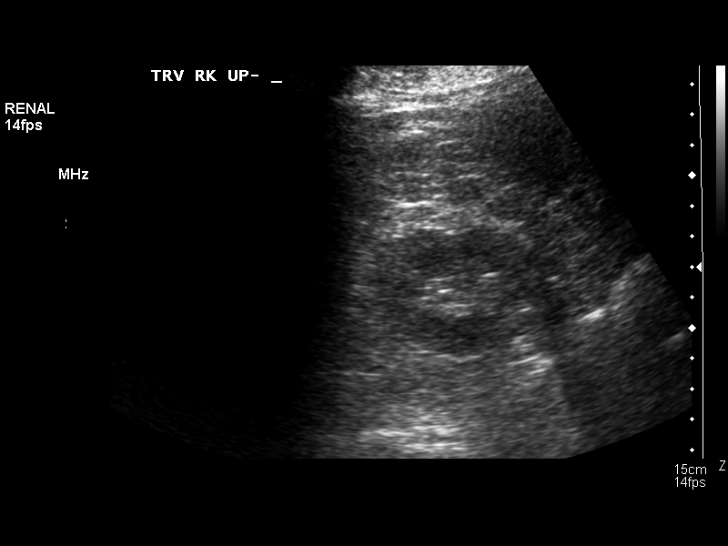
[im 9/25]
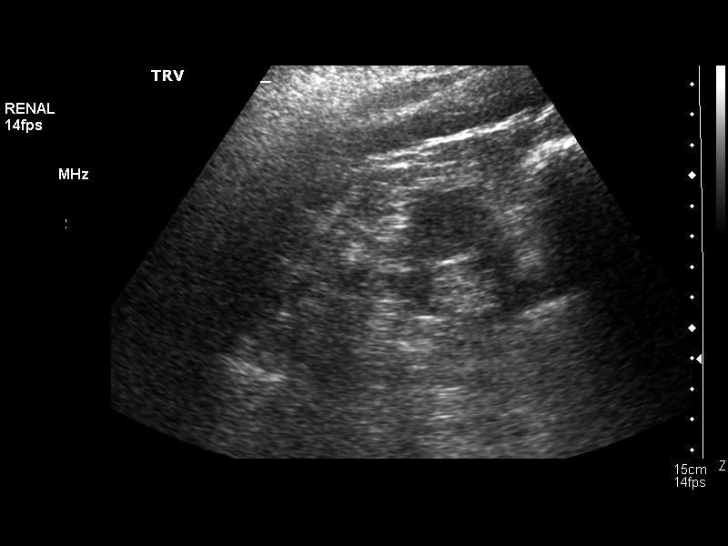
[im 10/25]
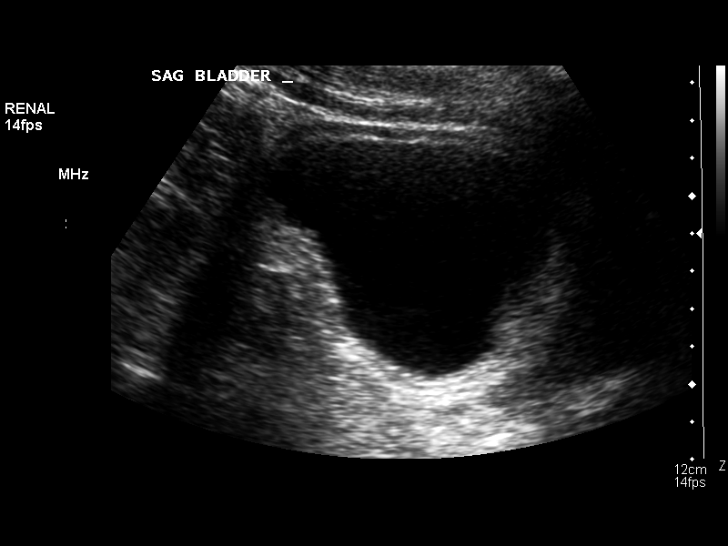
[im 12/25]
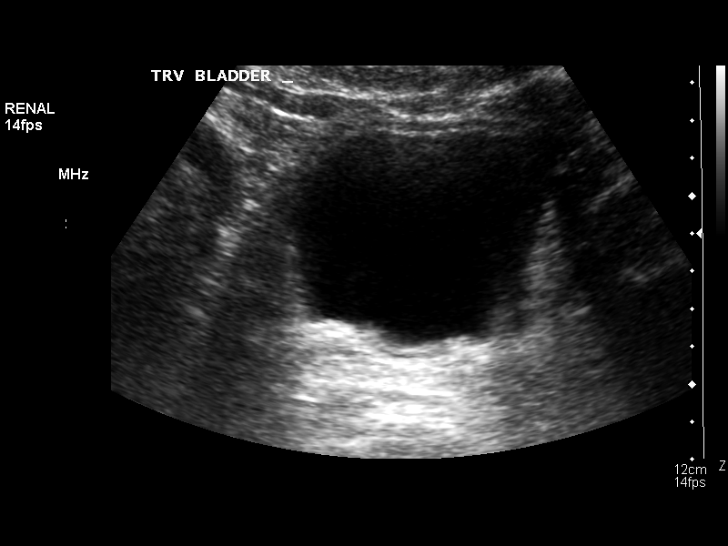
[im 14/25]
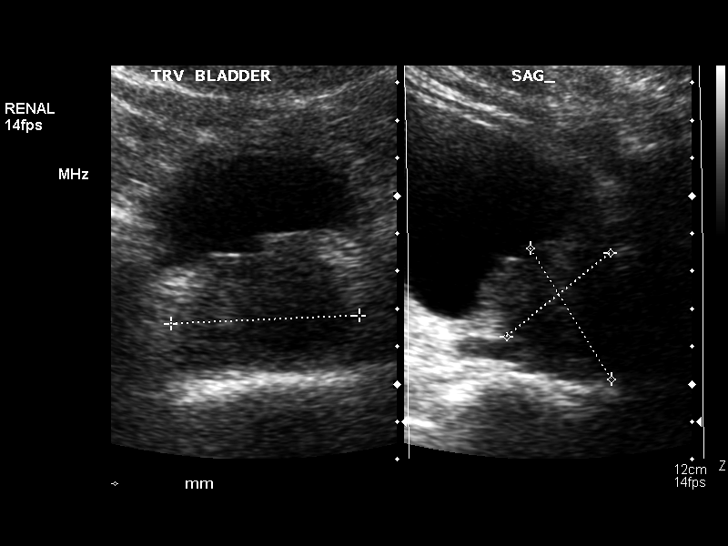
[im 16/25]
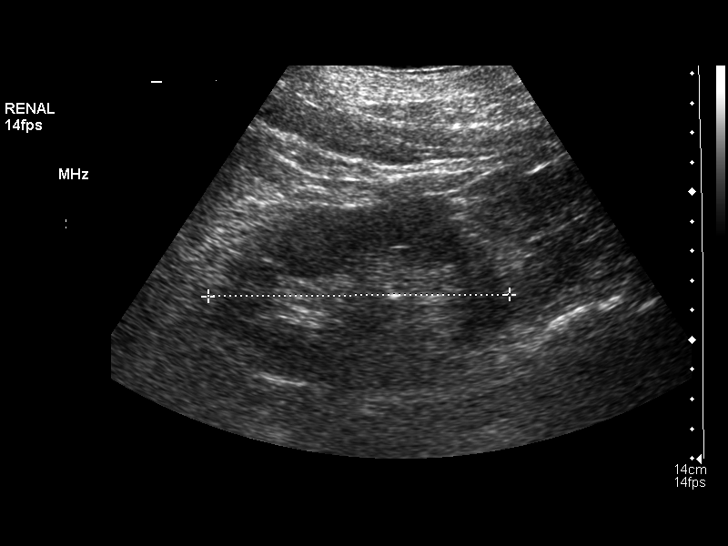
[im 17/25]
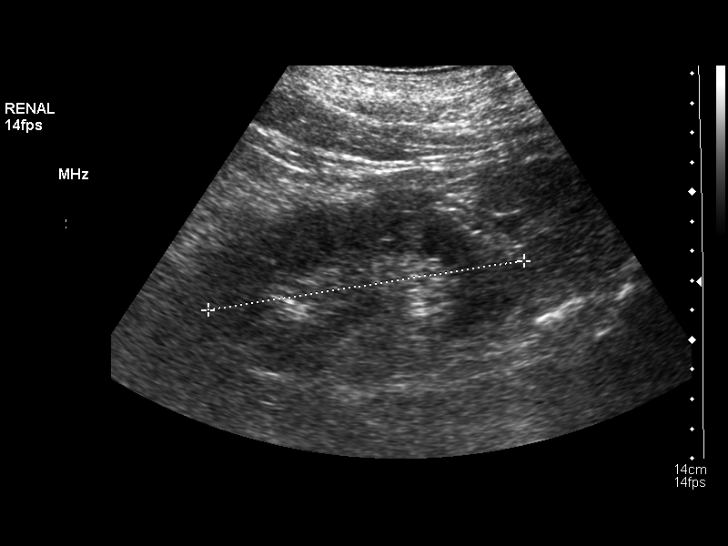
[im 19/25]
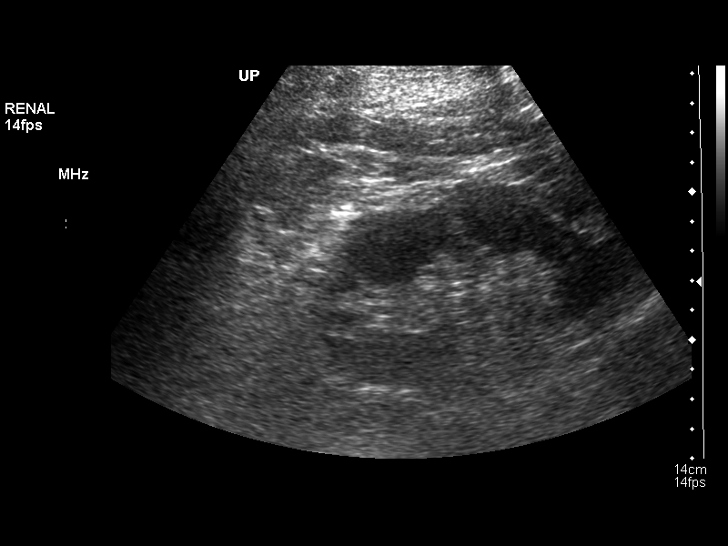
[im 21/25]
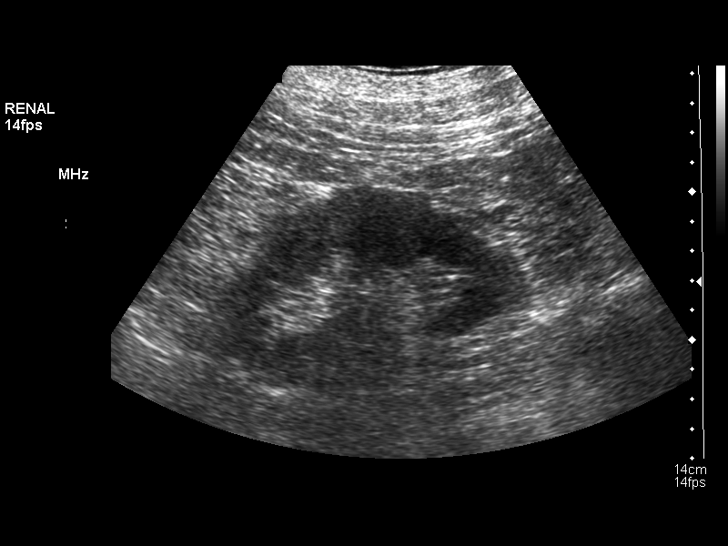
[im 23/25]
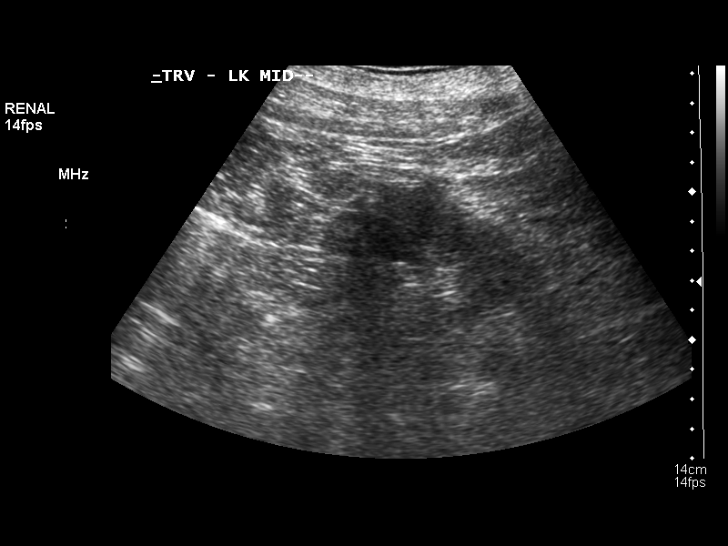
[im 25/25]
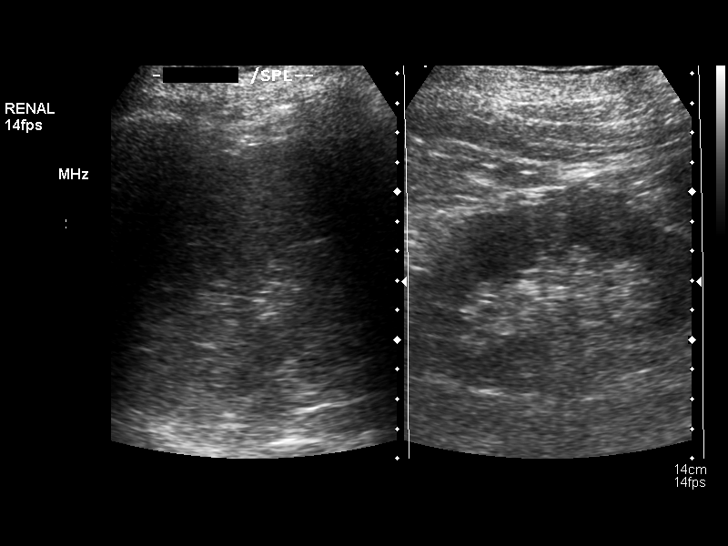

[14 of 25 positions shown; findings below may reference images not displayed]

FINDINGS: Right Kidney:  Slightly lobulated but otherwise normal renal
cortex.  11.9 cm in length.  No hydronephrosis.

Left Kidney:  Normal.  10.8 cm in length.

Bladder:  Normal.  Prostate gland is slightly enlarged with maximal
diameter 5.0 cm.
IMPRESSION: Slightly enlarged prostate gland.  Normal appearing kidneys.

## 2014-08-01 ENCOUNTER — Telehealth: Payer: Self-pay | Admitting: *Deleted

## 2014-08-01 MED ORDER — FINASTERIDE 5 MG PO TABS: 5.0000 mg | ORAL_TABLET | Freq: Every day | ORAL | Status: AC

## 2014-08-01 MED ORDER — FINASTERIDE 5 MG PO TABS: 5.0000 mg | ORAL_TABLET | Freq: Every day | ORAL | Status: DC

## 2014-08-01 NOTE — Telephone Encounter (Signed)
rx refilled. Pt aware 

## 2014-08-01 NOTE — Telephone Encounter (Signed)
Pt called stating he went to pharmacy to pick up all his medication that was suppose to be called in and stated received all except his proscar. Says he thought he has asked for this to be called in also.   [facility]

## 2014-08-06 ENCOUNTER — Telehealth: Payer: Self-pay | Admitting: Physician Assistant

## 2014-08-06 DIAGNOSIS — E118 Type 2 diabetes mellitus with unspecified complications: Secondary | ICD-10-CM

## 2014-08-06 NOTE — Telephone Encounter (Signed)
Patient is calling to say that he needs diabetic doctor, would like to see dr altheimer if possible  Please call him at [phone]

## 2014-08-06 NOTE — Telephone Encounter (Signed)
?  ok with referral to endocrinologist

## 2014-08-06 NOTE — Telephone Encounter (Signed)
Yes. That is fine. Approved. Refer to Dr. Elyse Hsu.

## 2014-08-07 ENCOUNTER — Telehealth: Payer: Self-pay | Admitting: Family Medicine

## 2014-08-07 MED ORDER — ALCOHOL PREPS PADS: 1.0000 | MEDICATED_PAD | Freq: Three times a day (TID) | Status: AC

## 2014-08-07 MED ORDER — ACCU-CHEK SOFTCLIX LANCETS MISC: 1.0000 | Freq: Three times a day (TID) | Status: AC

## 2014-08-07 MED ORDER — GLUCOSE BLOOD VI STRP: 1.0000 | ORAL_STRIP | Freq: Three times a day (TID) | Status: AC

## 2014-08-07 MED ORDER — SURECHEK CONTROL SOLUTION NORMAL VI LIQD: Status: AC

## 2014-08-07 MED ORDER — BLOOD GLUCOSE MONITOR KIT: PACK | Status: AC

## 2014-08-07 NOTE — Telephone Encounter (Signed)
Diabetic supplies to mail order for the second time

## 2014-08-08 NOTE — Telephone Encounter (Signed)
Referral placed to [facility]

## 2014-08-14 ENCOUNTER — Encounter: Payer: Self-pay | Admitting: Podiatry

## 2014-08-14 ENCOUNTER — Ambulatory Visit (INDEPENDENT_AMBULATORY_CARE_PROVIDER_SITE_OTHER): Payer: Medicare PPO | Admitting: Podiatry

## 2014-08-14 VITALS — BP 104/63 | HR 69 | Resp 16

## 2014-08-14 DIAGNOSIS — M79676 Pain in unspecified toe(s): Secondary | ICD-10-CM

## 2014-08-14 DIAGNOSIS — B351 Tinea unguium: Secondary | ICD-10-CM | POA: Diagnosis not present

## 2014-08-14 NOTE — Patient Instructions (Signed)
Diabetes and Foot Care Diabetes may cause you to have problems because of poor blood supply (circulation) to your feet and legs. This may cause the skin on your feet to become thinner, break easier, and heal more slowly. Your skin may become dry, and the skin may peel and crack. You may also have nerve damage in your legs and feet causing decreased feeling in them. You may not notice minor injuries to your feet that could lead to infections or more serious problems. Taking care of your feet is one of the most important things you can do for yourself.  HOME CARE INSTRUCTIONS  Wear shoes at all times, even in the house. Do not go barefoot. Bare feet are easily injured.  Check your feet daily for blisters, cuts, and redness. If you cannot see the bottom of your feet, use a mirror or ask someone for help.  Wash your feet with warm water (do not use hot water) and mild soap. Then pat your feet and the areas between your toes until they are completely dry. Do not soak your feet as this can dry your skin.  Apply a moisturizing lotion or petroleum jelly (that does not contain alcohol and is unscented) to the skin on your feet and to dry, brittle toenails. Do not apply lotion between your toes.  Trim your toenails straight across. Do not dig under them or around the cuticle. File the edges of your nails with an emery board or nail file.  Do not cut corns or calluses or try to remove them with medicine.  Wear clean socks or stockings every day. Make sure they are not too tight. Do not wear knee-high stockings since they may decrease blood flow to your legs.  Wear shoes that fit properly and have enough cushioning. To break in new shoes, wear them for just a few hours a day. This prevents you from injuring your feet. Always look in your shoes before you put them on to be sure there are no objects inside.  Do not cross your legs. This may decrease the blood flow to your feet.  If you find a minor scrape,  cut, or break in the skin on your feet, keep it and the skin around it clean and dry. These areas may be cleansed with mild soap and water. Do not cleanse the area with peroxide, alcohol, or iodine.  When you remove an adhesive bandage, be sure not to damage the skin around it.  If you have a wound, look at it several times a day to make sure it is healing.  Do not use heating pads or hot water bottles. They may burn your skin. If you have lost feeling in your feet or legs, you may not know it is happening until it is too late.  Make sure your health care provider performs a complete foot exam at least annually or more often if you have foot problems. Report any cuts, sores, or bruises to your health care provider immediately. SEEK MEDICAL CARE IF:   You have an injury that is not healing.  You have cuts or breaks in the skin.  You have an ingrown nail.  You notice redness on your legs or feet.  You feel burning or tingling in your legs or feet.  You have pain or cramps in your legs and feet.  Your legs or feet are numb.  Your feet always feel cold. SEEK IMMEDIATE MEDICAL CARE IF:   There is increasing redness,   swelling, or pain in or around a wound.  There is a red line that goes up your leg.  Pus is coming from a wound.  You develop a fever or as directed by your health care provider.  You notice a bad smell coming from an ulcer or wound. Document Released: 05/08/2000 Document Revised: 01/11/2013 Document Reviewed: 10/18/2012 Exit[employer] Patient Information 2015 [employer]. This information is not intended to replace advice given to you by your health care provider. Make sure you discuss any questions you have with your health care provider.  

## 2014-08-14 NOTE — Progress Notes (Signed)
   Subjective:    Patient ID: Phillip Herring, male    DOB: 08/31/1940, 74 y.o.   MRN: [id number]  HPI   74 year old male presents the office today for painful, elongated toenails which she is unable to trim himself on his left foot. He denies any redness or drainage on the nail sites. He is diabetic and states his blood sugar vertically runs in the 100s. He recently underwent a right below-knee amputation due to infection 2 months ago. He denies any recent opened sores on the left foot. No other complaints at this time.  Review of Systems  Endocrine:       Diabetes        Objective:   Physical Exam AAO 3, NAD DP/PT pulses palpable although somewhat decreased. CRT less than 3 seconds Protective sensation intact with Simms Weinstein monofilament, vibratory sensation intact. Achilles tendon reflex intact. Status post right BKA Left foot nails are hypertrophic, dystrophic, elongated, brittle, discolored 5. There is no surrounding erythema or drainage on the nail sites. There subjective tenderness on nails 1-5 bilaterally. There is a small amount of dried subungual hematoma present in the second and third digit toenails. No open lesions or pre-ulcer lesions identified. No interdigital maceration. No other areas to left lower extremity. No overlying edema, erythema, increase in warmth. No pain with calf compression, swelling, warmth, erythema.     Assessment & Plan:  74 year old male with symptomatic onychomycosis -Treatment options discussed include alternatives, risks, complications -Nail sharply debrided 5 without complication/bleeding. -Discussed the importance daily foot inspection. -Follow-up in 3 months or sooner if any problems are to arise. In the meantime encouraged to call the office with any questions, concerns, changes symptoms.

## 2014-08-17 ENCOUNTER — Ambulatory Visit: Payer: Medicare PPO | Admitting: Endocrinology

## 2014-08-22 ENCOUNTER — Ambulatory Visit (INDEPENDENT_AMBULATORY_CARE_PROVIDER_SITE_OTHER): Payer: Medicare PPO | Admitting: Endocrinology

## 2014-08-22 ENCOUNTER — Encounter: Payer: Self-pay | Admitting: Endocrinology

## 2014-08-22 VITALS — BP 122/76 | HR 73 | Temp 98.6°F

## 2014-08-22 DIAGNOSIS — E785 Hyperlipidemia, unspecified: Secondary | ICD-10-CM

## 2014-08-22 DIAGNOSIS — E1122 Type 2 diabetes mellitus with diabetic chronic kidney disease: Secondary | ICD-10-CM | POA: Diagnosis not present

## 2014-08-22 DIAGNOSIS — E1165 Type 2 diabetes mellitus with hyperglycemia: Principal | ICD-10-CM

## 2014-08-22 DIAGNOSIS — E1149 Type 2 diabetes mellitus with other diabetic neurological complication: Secondary | ICD-10-CM | POA: Diagnosis not present

## 2014-08-22 DIAGNOSIS — N184 Chronic kidney disease, stage 4 (severe): Secondary | ICD-10-CM

## 2014-08-22 DIAGNOSIS — I10 Essential (primary) hypertension: Secondary | ICD-10-CM

## 2014-08-22 DIAGNOSIS — IMO0002 Reserved for concepts with insufficient information to code with codable children: Secondary | ICD-10-CM

## 2014-08-22 LAB — LIPID PANEL
CHOLESTEROL: 154 mg/dL (ref 0–200)
HDL: 37.2 mg/dL — ABNORMAL LOW (ref >39.00)
LDL CALC: 77 mg/dL (ref 0–99)
NonHDL: 116.8
Total CHOL/HDL Ratio: 4
Triglycerides: 199 mg/dL — ABNORMAL HIGH (ref 0.0–149.0)
VLDL: 39.8 mg/dL (ref 0.0–40.0)

## 2014-08-22 LAB — HEMOGLOBIN A1C: HEMOGLOBIN A1C: 7.4 % — AB (ref 4.6–6.5)

## 2014-08-22 LAB — GLUCOSE, POCT (MANUAL RESULT ENTRY): POC GLUCOSE: 197 mg/dL — AB (ref 70–99)

## 2014-08-22 NOTE — Patient Instructions (Addendum)
Please check blood sugars at least half the time about 2 hours after any meal and 3 times per week on waking up.   Please bring blood sugar monitor to each visit.  Recommended blood sugar levels about 2 hours after meal is 140-180 and on waking up 90-130  Take Humulin insulin 30 min before eating   Take 12 units Humulin before lunch and supper and keep 10 in am

## 2014-08-22 NOTE — Progress Notes (Signed)
Patient ID: Phillip Herring, male   DOB: Dec 12, 1940, 74 y.o.   MRN: [id number]           Reason for Appointment: Consultation for Type 2 Diabetes  Referring physician: [name]  History of Present Illness:          Diagnosis: Type 2 diabetes mellitus, date of diagnosis: 2010       Past history:  Details of his previous history are not available but he was previously treated with metformin until his renal function was worse and this was stopped. He thinks he took glipizide also previously but more recently had been taking Actos and Tradgenta Because of poor control he was started on insulin in 03/2014  Recent history: He has been on Levemir and premixed insulin since about 03/2014 He generally takes to premixed insulin with meals and Levemir at night However his A1c has been consistently over 8% with the last measurement being in 1/16 After his hospitalization in 1/16 he was in the nursing home and apparently his blood sugars are going up over 200 at times He has been home for about 3-4 weeks and because of poor control he is referred here for further management He did not bring his blood sugar for download but he thinks his blood sugars at breakfast are fairly good but higher in the evening; usually taking before supper and not after meals at all No reports of hypoglycemia Since his discharge from the nursing home he has been also taking pioglitazone but not Tradgenta       Oral hypoglycemic drugs the patient is taking are: Actos 45 mg      Side effects from medications have been: None INSULIN regimen is described as:  Humulin 70/30 8 units tid.  He takes this right before or right after eating and usually remembers to take this.  Levemir 8 units at bedtime.  He injects in his abdomen on the sides   Compliance with the medical regimen: Good Hypoglycemia: none    Glucose monitoring:  done 2 times  a day         Glucometer: One Touch.      Blood Glucose readings by time of day by  recall   PREMEAL Breakfast Lunch Dinner Bedtime  Overall   Glucose range: 100  130-160    Median:          Self-care: The diet that the patient has been following is: None, sometimes will have fried food.  Usually has a family member helping him with his cooking Meals: 3 meals per day. Breakfast is at 8 am and will have grits and toast or egg/bacon and toast.  Lunch: 12 noon usually with meat and 2 vegetables and dinner is 6-7 pm and will usually eat a sandwich.  He will have snacks with Jell-O, pudding, crackers and peanut butter                    Dietician visit, most recent: At diagnosis .               Weight history: Wt Readings from Last 3 Encounters:  06/28/14 214 lb 12.8 oz (97.433 kg)  05/30/14 226 lb 12.8 oz (102.876 kg)  05/28/14 218 lb (98.884 kg)    Glycemic control:   Lab Results  Component Value Date   HGBA1C 8.8* 06/04/2014   HGBA1C 8.8* 05/16/2014   HGBA1C 8.5* 02/07/2014   Lab Results  Component Value Date   LDLCALC 70  10/27/2012   CREATININE 3.0* 06/11/2014         Medication List       This list is accurate as of: 08/22/14  3:02 PM.  Always use your most recent med list.               ACCU-CHEK SOFTCLIX LANCETS lancets  1 each by Other route 4 (four) times daily -  before meals and at bedtime. Use as instructed     Alcohol Preps Pads  1 each by Does not apply route 4 (four) times daily -  before meals and at bedtime.     amLODipine 10 MG tablet  Commonly known as:  NORVASC  TAKE 1 TABLET (10 MG TOTAL) BY MOUTH DAILY.     aspirin EC 81 MG tablet  Take 81 mg by mouth daily.     B-D INS SYR ULTRAFINE 1CC/30G 30G X 1/2" 1 ML Misc  Generic drug:  Insulin Syringe-Needle U-100  See admin instructions.     blood glucose meter kit and supplies Kit  Dispense based on patient and insurance preference. Use up to four times daily as directed. (FOR ICD-9 250.00, 250.01).     BYSTOLIC 5 MG tablet  Generic drug:  nebivolol  TAKE 1 TABLET BY  MOUTH EVERY DAY     calcitRIOL 0.5 MCG capsule  Commonly known as:  ROCALTROL  Take 0.5 mcg by mouth daily.     cholecalciferol 1000 UNITS tablet  Commonly known as:  VITAMIN D  Take 1,000 Units by mouth daily.     finasteride 5 MG tablet  Commonly known as:  PROSCAR  Take 1 tablet (5 mg total) by mouth daily.     furosemide 80 MG tablet  Commonly known as:  LASIX  Take 80 mg by mouth daily.     gabapentin 100 MG capsule  Commonly known as:  NEURONTIN  Take 100 mg by mouth at bedtime.     glucose blood test strip  1 each by Other route 4 (four) times daily -  before meals and at bedtime. Use as instructed     insulin detemir 100 UNIT/ML injection  Commonly known as:  LEVEMIR  Inject 0.1 mLs (10 Units total) into the skin at bedtime.     insulin NPH-regular Human (70-30) 100 UNIT/ML injection  Commonly known as:  NOVOLIN 70/30  Inject 8 Units into the skin 3 (three) times daily with meals.     oxyCODONE-acetaminophen 5-325 MG per tablet  Commonly known as:  PERCOCET/ROXICET  Take 1-2 tablets by mouth every 4 (four) hours as needed for moderate pain or severe pain.     pioglitazone 45 MG tablet  Commonly known as:  ACTOS  Take 45 mg by mouth daily.     SURECHEK CONTROL SOLUTION NORMAL Liqd  ACCU-CHEK AVIVA SOLUTION  USE AS DIRECTED        Allergies:  Allergies  Allergen Reactions  . Enalapril Other (See Comments)    Hyperkalemia.  . Metformin And Related Other (See Comments)    Renal insufficiency   . Tekturna [Aliskiren] Other (See Comments)    hyperkalemia    Past Medical History  Diagnosis Date  . Hypertension   . Undescended testicle   . Vitamin D deficiency   . Prostate cancer   . Chronic hyperkalemia 12/23/2012    Sees Renal  . NIDDM (non-insulin dependent diabetes mellitus)   . CKD (chronic kidney disease), stage IV     Past Surgical History  Procedure Laterality Date  . Foot amputation Right 2010    cut off 1/2 my foot"  . Refractive  surgery Bilateral 2000's  . Av fistula placement Left 04/17/2013    Procedure: ARTERIOVENOUS (AV) FISTULA CREATION- LEFT RADIOCEPHALIC;  Surgeon: Phillip Posner, MD;  Location: [facility] [facility] [place] [facility];  Service: Vascular;  Laterality: Left;  . Shuntogram N/A 08/03/2013    Procedure: Phillip Mallet;  Surgeon: Conrad Frontenac, MD;  Location[facility] CATH LAB;  Service: Cardiovascular;  Laterality: N/A;  . Colonoscopy    . Foot amputation Right 05/30/2014    transtibial  . Amputation Right 05/30/2014    Procedure: AMPUTATION BELOW KNEE;  Surgeon: Newt Minion, MD;  Location: [place];  Service: Orthopedics;  Laterality: Right;    Family History  Problem Relation Age of Onset  . Cancer Sister     Social History:  reports that he quit smoking about 31 years ago. His smoking use included Cigarettes. He has a 25 pack-year smoking history. He has never used smokeless tobacco. He reports that he does not drink alcohol or use illicit drugs.    Review of Systems    Lipid history: Never on medications for this, no recent labs available       Lab Results  Component Value Date   CHOL 158 10/27/2012   HDL 34* 10/27/2012   LDLCALC 70 10/27/2012   TRIG 271* 10/27/2012   CHOLHDL 4.6 10/27/2012           Constitutional: no recent weight gain/loss, no complaints of unusual fatigue   Eyes: no history of blurred vision. Most recent eye exam was 2013   ENT: no  difficulty swallowing, no hoarseness   Cardiovascular: no chest pain or tightness on exertion.  He may get mild left leg swelling but mostly when he has been up and about.   Hypertension: This is being treated with Bystolic and amlodipine  Respiratory: no cough/shortness of breath  Gastrointestinal: no constipation, diarrhea, nausea or abdominal pain  Musculoskeletal: no muscle/joint aches   Urological:   No frequency of urination or  nocturia.  Has been prescribed Proscar for BPH  Skin: no rash or infections  Neurological: no headaches.  Has mild numbness and   tingling in his feet, currently not taking gabapentin that had been prescribed    Psychiatric: no depression/anxiety  Endocrine: No cold intolerance    LABS:  No visits with results within 1 Week(s) from this visit. Latest known visit with results is:  Nursing Home on 06/28/2014  Component Date Value Ref Range Status  . Glucose 06/11/2014 151   Final  . BUN 06/11/2014 75* 4 - 21 mg/dL Final  . Creatinine 06/11/2014 3.0* 0.6 - 1.3 mg/dL Final  . Potassium 06/11/2014 4.3  3.4 - 5.3 mmol/L Final  . Sodium 06/11/2014 139  137 - 147 mmol/L Final    Physical Examination:  BP 122/76 mmHg  Pulse 73  Temp(Src) 98.6 F (37 C) (Oral)  SpO2 96%  GENERAL:         Patient has mild obesity Alert and looks well .   HEENT:         Eye exam shows normal external appearance. Fundus exam  not clearly seen on the right side but left side shows probable scattered microaneurysms Oral exam shows normal mucosa .  NECK:         General:  Neck exam shows no lymphadenopathy. Carotids are normal to palpation and no bruit heard.  Thyroid is not enlarged  and no nodules felt.   LUNGS:         Chest is symmetrical. Lungs are clear to auscultation.Marland Kitchen   HEART:     heart rate regular.       Heart sounds:  S1 and S2 are normal. No murmurs or clicks heard., no S3 or S4.   ABDOMEN:   There is no distention present. Liver and spleen are not palpable. No other mass or tenderness present.  EXTREMITIES:    right below-knee amputation present He has Trace left ankle dema. No skin lesions present.Marland Kitchen  NEUROLOGICAL:   Vibration sense is  nearly absent in toes. Ankle jerks are absent on the left  Diabetic foot exam of the left foot shows markedly decreased monofilament sensation in the toes, absent on the fourth and fifth toes and and plantar surfaces, no skin lesions or ulcers on the feet and normal pedal pulses Mild club foot present      MUSCULOSKELETAL:       There is no enlargement or deformity of the joints. Spine is  normal to inspection.Marland Kitchen   SKIN:       No rash or lesions of concern.        ASSESSMENT:  Diabetes type 2, uncontrolled with previous A1c around 8.8%     He is on a regimen of premixed insulin before meals and Levemir at bedtime Most likely is not getting adequate control of postprandial readings with his history of high A1c; has not been monitoring postprandial readings and blood sugar is relatively high today after lunch He has been taking his insulin sometimes after meals and this may not be effective in controlling postprandial insulin since he is taking 70/30 insulin and not an analog Although he may be insulin resistant he probably has mostly insulin deficiency at this stage and not clear if he is benefiting from pioglitazone; however because of his history of low HDL and metabolic syndrome he will continue this  Complications: peripheral neuropathy, below-knee amputation on the right and possible retinopathy He is overdue for an eye exam His chronic kidney disease is reportedly from diabetes   CHRONIC kidney disease with creatinine about 3.0.  Followed by nephrologist  HYPERTENSION: Well controlled  Lipids status: Unknown and will need repeat levels    PLAN:   Start checking blood sugars at various times including after meals to help identify blood sugar patterns before adjusting insulin regimen  For now we'll continue his premixed insulin and Levemir at night but increase the dose to 12 units at lunch and supper  If he has significant variability of blood sugars are higher postprandial readings will consider basal bolus insulin with Levemir and Regular Insulin  Continue Actos  Consultation with dietitian  Stressed the need for regular eye exams  Discussed foot care for prevention of ulcers because of his sensory loss  He will not restart Senaida Lange as this is unlikely to be helpful  Schedule his eye exam  Continue follow with nephrologist for CKD  Check labs today to  assess recent level of control  Patient Instructions  Please check blood sugars at least half the time about 2 hours after any meal and 3 times per week on waking up.   Please bring blood sugar monitor to each visit.  Recommended blood sugar levels about 2 hours after meal is 140-180 and on waking up 90-130  Take Humulin insulin 30 min before eating   Take 12 units Humulin before lunch and supper and keep 10  in am      Counseling time over 50% of today's 60 minute visit  Khoen Genet 08/22/2014, 3:02 PM   Note: This office note was prepared with Estate agent. Any transcriptional errors that result from this process are unintentional.

## 2014-08-23 LAB — COMPREHENSIVE METABOLIC PANEL
ALBUMIN: 4 g/dL (ref 3.5–5.2)
ALT: 7 U/L (ref 0–53)
AST: 13 U/L (ref 0–37)
Alkaline Phosphatase: 44 U/L (ref 39–117)
BUN: 79 mg/dL — AB (ref 6–23)
CALCIUM: 10.4 mg/dL (ref 8.4–10.5)
CHLORIDE: 100 meq/L (ref 96–112)
CO2: 29 meq/L (ref 19–32)
Creatinine, Ser: 4.37 mg/dL — ABNORMAL HIGH (ref 0.40–1.50)
GFR: 14.25 mL/min — AB (ref >60.00)
GLUCOSE: 189 mg/dL — AB (ref 70–99)
Potassium: 4.3 mEq/L (ref 3.5–5.1)
Sodium: 137 mEq/L (ref 135–145)
TOTAL PROTEIN: 8.1 g/dL (ref 6.0–8.3)
Total Bilirubin: 0.3 mg/dL (ref 0.2–1.2)

## 2014-08-23 NOTE — Progress Notes (Signed)
Quick Note:  Please let patient know that the A1c diabetes test is slightly better at 7.4 but the kidney test is worse and he needs to talk to his nephrologist. I think he sees Dr. Jamal Maes, please fax to her  ______

## 2014-08-27 ENCOUNTER — Encounter: Payer: Self-pay | Admitting: Physician Assistant

## 2014-08-27 ENCOUNTER — Ambulatory Visit (INDEPENDENT_AMBULATORY_CARE_PROVIDER_SITE_OTHER): Payer: Medicare PPO | Admitting: Physician Assistant

## 2014-08-27 VITALS — BP 126/70 | HR 64 | Temp 97.8°F | Resp 18

## 2014-08-27 DIAGNOSIS — C61 Malignant neoplasm of prostate: Secondary | ICD-10-CM | POA: Diagnosis not present

## 2014-08-27 DIAGNOSIS — Z89511 Acquired absence of right leg below knee: Secondary | ICD-10-CM | POA: Diagnosis not present

## 2014-08-27 DIAGNOSIS — I10 Essential (primary) hypertension: Secondary | ICD-10-CM | POA: Diagnosis not present

## 2014-08-27 DIAGNOSIS — S98911S Complete traumatic amputation of right foot, level unspecified, sequela: Secondary | ICD-10-CM | POA: Diagnosis not present

## 2014-08-27 DIAGNOSIS — E559 Vitamin D deficiency, unspecified: Secondary | ICD-10-CM

## 2014-08-27 DIAGNOSIS — E118 Type 2 diabetes mellitus with unspecified complications: Secondary | ICD-10-CM

## 2014-08-27 DIAGNOSIS — E875 Hyperkalemia: Secondary | ICD-10-CM | POA: Diagnosis not present

## 2014-08-27 DIAGNOSIS — E1122 Type 2 diabetes mellitus with diabetic chronic kidney disease: Secondary | ICD-10-CM | POA: Diagnosis not present

## 2014-08-27 DIAGNOSIS — N184 Chronic kidney disease, stage 4 (severe): Secondary | ICD-10-CM | POA: Diagnosis not present

## 2014-08-27 DIAGNOSIS — N186 End stage renal disease: Secondary | ICD-10-CM

## 2014-08-27 DIAGNOSIS — Q5310 Unspecified undescended testicle, unilateral: Secondary | ICD-10-CM

## 2014-08-27 NOTE — Progress Notes (Signed)
Patient ID: Phillip Herring MRN: [id number], DOB: December 07, 1940, 74 y.o. Date of Encounter: @DATE @  Chief Complaint:  Chief Complaint  Patient presents with  . 3 mth check up    had some OJ    HPI: 74 y.o. year old male  presents for follow-up visit.  He recently established with endocrinologist. Saw Dr. Elayne Snare this past Wednesday. He  states that the only change in medicines was that he changed his insulin from 8 units to 12 units. That the A1c that day was 7.4. He is to follow-up with Dr. Dwyane Dee in 3 weeks.  Says that when he saw Dr. Sharol Given March 18 that the amputation site was healed and they have ordered his prosthetic and biotech has made the molding and that this should be coming in and being ready very soon.  Says his next appointment with Dr. Lorrene Reid is this Thursday. Has not seen her since -- just prior to his amputation surgery.  No complaints or concerns today.  Past Medical History  Diagnosis Date  . Hypertension   . Undescended testicle   . Vitamin D deficiency   . Prostate cancer   . Chronic hyperkalemia 12/23/2012    Sees Renal  . NIDDM (non-insulin dependent diabetes mellitus)   . CKD (chronic kidney disease), stage IV      Home Meds: Outpatient Prescriptions Prior to Visit  Medication Sig Dispense Refill  . ACCU-CHEK SOFTCLIX LANCETS lancets 1 each by Other route 4 (four) times daily -  before meals and at bedtime. Use as instructed 360 each 3  . Alcohol Swabs (ALCOHOL PREPS) PADS 1 each by Does not apply route 4 (four) times daily -  before meals and at bedtime. 360 each 3  . amLODipine (NORVASC) 10 MG tablet TAKE 1 TABLET (10 MG TOTAL) BY MOUTH DAILY. 30 tablet 5  . aspirin EC 81 MG tablet Take 81 mg by mouth daily.    . Blood Glucose Calibration (SURECHEK CONTROL SOLUTION) NORMAL LIQD ACCU-CHEK AVIVA SOLUTION  USE AS DIRECTED 1 each 1  . blood glucose meter kit and supplies KIT Dispense based on patient and insurance preference. Use up to four times daily as  directed. (FOR ICD-9 250.00, 250.01). 1 each 0  . BYSTOLIC 5 MG tablet TAKE 1 TABLET BY MOUTH EVERY DAY 30 tablet 5  . calcitRIOL (ROCALTROL) 0.5 MCG capsule Take 0.5 mcg by mouth daily.    . cholecalciferol (VITAMIN D) 1000 UNITS tablet Take 1,000 Units by mouth daily.    . finasteride (PROSCAR) 5 MG tablet Take 1 tablet (5 mg total) by mouth daily. 30 tablet 2  . furosemide (LASIX) 80 MG tablet Take 80 mg by mouth daily.     Marland Kitchen gabapentin (NEURONTIN) 100 MG capsule Take 100 mg by mouth at bedtime.    Marland Kitchen glucose blood test strip 1 each by Other route 4 (four) times daily -  before meals and at bedtime. Use as instructed 360 each 3  . insulin detemir (LEVEMIR) 100 UNIT/ML injection Inject 0.1 mLs (10 Units total) into the skin at bedtime. 10 mL 3  . insulin NPH-regular Human (NOVOLIN 70/30) (70-30) 100 UNIT/ML injection Inject 8 Units into the skin 3 (three) times daily with meals.    Marland Kitchen oxyCODONE-acetaminophen (PERCOCET/ROXICET) 5-325 MG per tablet Take 1-2 tablets by mouth every 4 (four) hours as needed for moderate pain or severe pain. (Patient not taking: Reported on 08/22/2014) 180 tablet 0  . pioglitazone (ACTOS) 45 MG tablet Take  45 mg by mouth daily.    . B-D INS SYR ULTRAFINE 1CC/30G 30G X 1/2" 1 ML MISC See admin instructions.  11   No facility-administered medications prior to visit.    Allergies:  Allergies  Allergen Reactions  . Enalapril Other (See Comments)    Hyperkalemia.  . Metformin And Related Other (See Comments)    Renal insufficiency   . Tekturna [Aliskiren] Other (See Comments)    hyperkalemia    History   Social History  . Marital Status: Divorced    Spouse Name: N/A  . Number of Children: N/A  . Years of Education: N/A   Occupational History  . Not on file.   Social History Main Topics  . Smoking status: Former Smoker -- 1.00 packs/day for 25 years    Types: Cigarettes    Quit date: 10/28/1982  . Smokeless tobacco: Never Used  . Alcohol Use: No  .  Drug Use: No  . Sexual Activity: No   Other Topics Concern  . Not on file   Social History Narrative    Family History  Problem Relation Age of Onset  . Cancer Sister      Review of Systems:  See HPI for pertinent ROS. All other ROS negative.    Physical Exam: Blood pressure 126/70, pulse 64, temperature 97.8 F (36.6 C), temperature source Oral, resp. rate 18, weight 0 lb (0 kg)., Body mass index is 0.00 kg/(m^2). General: WNWD WM. Appears in no acute distress. Sitting in wheelchair.  Neck: Supple. No thyromegaly. No lymphadenopathy. Lungs: Clear bilaterally to auscultation without wheezes, rales, or rhonchi. Breathing is unlabored. Heart: RRR with S1 S2. No murmurs, rubs, or gallops. Musculoskeletal:  Strength and tone normal for age. Right BKA--Site warpped.  Extremities/Skin: Warm and dry.  Neuro: Alert and oriented X 3. Moves all extremities spontaneously. Gait is normal. CNII-XII grossly in tact. Psych:  Responds to questions appropriately with a normal affect.     ASSESSMENT AND PLAN:  74 y.o. year old male with  1. S/P BKA (below knee amputation) unilateral, right   2. Diabetes mellitus type 2 with complications   3. CKD (chronic kidney disease), stage 4 (severe)   4. End stage renal disease   5. Essential hypertension  Today I printed copy of labs performed by Dr. Dwyane Dee 08/22/14 so that patient can take a copy of these results to Dr. Lorrene Reid. He had A1c , CMET and lipid panel done. Today I discussed with patient that more than happy to see him but do not want him to continue to come here every 3 months if there is nothing for me to do--- Dr. Dwyane Dee and Dr. Lorrene Reid are managing all of his medical issues. Told him to schedule follow-up here as needed.   Marin Olp Bigelow, [place], [facility] 08/27/2014 8:58 AM

## 2014-09-06 ENCOUNTER — Telehealth: Payer: Self-pay | Admitting: Family Medicine

## 2014-09-06 ENCOUNTER — Other Ambulatory Visit: Payer: Self-pay | Admitting: *Deleted

## 2014-09-06 DIAGNOSIS — E118 Type 2 diabetes mellitus with unspecified complications: Secondary | ICD-10-CM

## 2014-09-06 MED ORDER — INSULIN DETEMIR 100 UNIT/ML ~~LOC~~ SOLN: 10.0000 [IU] | Freq: Every day | SUBCUTANEOUS | Status: AC

## 2014-09-06 MED ORDER — AMLODIPINE BESYLATE 10 MG PO TABS: ORAL_TABLET | ORAL | Status: AC

## 2014-09-06 NOTE — Telephone Encounter (Signed)
Approved.  

## 2014-09-06 NOTE — Telephone Encounter (Signed)
Pharmacy sent fax with 70/30 insulin at 5 units TID, pt says taking more and need RX so that he get the correct amount.  Per last visit 08/22/14 with Dr.  Dwyane Dee, the 70/30 insulin was changed to 8 units TID w/meals.  I called pharmacy and clarified this and approved refills for 6 months.

## 2014-09-06 NOTE — Telephone Encounter (Signed)
Received fax requesting refill on Amlodipine.   Refill appropriate and filled per protocol. 

## 2014-09-12 ENCOUNTER — Encounter: Payer: Self-pay | Admitting: Endocrinology

## 2014-09-12 ENCOUNTER — Ambulatory Visit (INDEPENDENT_AMBULATORY_CARE_PROVIDER_SITE_OTHER): Payer: Medicare PPO | Admitting: Endocrinology

## 2014-09-12 VITALS — BP 126/62 | HR 75 | Temp 98.0°F | Resp 16

## 2014-09-12 DIAGNOSIS — N184 Chronic kidney disease, stage 4 (severe): Secondary | ICD-10-CM | POA: Diagnosis not present

## 2014-09-12 DIAGNOSIS — E1149 Type 2 diabetes mellitus with other diabetic neurological complication: Secondary | ICD-10-CM | POA: Diagnosis not present

## 2014-09-12 DIAGNOSIS — E1165 Type 2 diabetes mellitus with hyperglycemia: Principal | ICD-10-CM

## 2014-09-12 DIAGNOSIS — E1122 Type 2 diabetes mellitus with diabetic chronic kidney disease: Secondary | ICD-10-CM | POA: Diagnosis not present

## 2014-09-12 DIAGNOSIS — IMO0002 Reserved for concepts with insufficient information to code with codable children: Secondary | ICD-10-CM

## 2014-09-12 NOTE — Patient Instructions (Signed)
Please check blood sugars at least half the time about 2 hours after any meal and 3 times per week on waking up.   Please bring blood sugar monitor to each visit. Recommended blood sugar levels about 2 hours after meal is 140-180 and on waking up 90-130

## 2014-09-12 NOTE — Progress Notes (Signed)
Patient ID: Phillip Herring, male   DOB: May 11, 1941, 74 y.o.   MRN: [id number]           Reason for Appointment:  Follow-up for Type 2 Diabetes  Referring physician: Dena Billet  History of Present Illness:          Diagnosis: Type 2 diabetes mellitus, date of diagnosis: 2010       Past history:  Details of his previous history are not available but he was previously treated with metformin until his renal function was worse and this was stopped. He thinks he took glipizide also previously but more recently had been taking Actos and Tradgenta Because of poor control he was started on insulin in 03/2014  Recent history: He has been on Levemir At bedtime and premixed insulin before meals since about 03/2014   Although he was referred for poor control and previously higher A1c levels his follow-up A1c on his initial visit was 7.4.   Apparently he did not get consistent died when he was at the nursing home earlier this year.  Because of tendency to high readings after meals his premixed insulin was increased at lunch and suppertime    However he has not checked his blood sugars after meals again as directed and did not bring his monitor for review.  No recent labs available No reports of hypoglycemia       Oral hypoglycemic drugs the patient is taking are: Actos 45 mg      Side effects from medications have been: None INSULIN regimen is described as:  Humulin 70/30: 03-06-11 units tid.  He takes this right before or right after eating   Levemir 8 units at bedtime.    Compliance with the medical regimen: Good Hypoglycemia: none    Glucose monitoring:  done 2 times  a day         Glucometer: One Touch.      Blood Glucose readings by time of day by recall  PRE-MEAL Breakfast Lunch Dinner Bedtime Overall  Glucose range: 110 130-140 130-140 ?   Mean/median:             Self-care: The diet that the patient has been following is: None, sometimes will have fried food.  Usually has a family  member helping him with his cooking Meals: 3 meals per day. Breakfast is at 8 am and will have grits and toast or egg/bacon and toast.  Lunch: 12 noon usually with meat and 2 vegetables and dinner is 6-7 pm and will usually eat a sandwich.  He will have snacks with Jell-O, pudding, crackers and peanut butter                    Dietician visit, most recent: At diagnosis .               Weight history:  Wt Readings from Last 3 Encounters:  06/28/14 214 lb 12.8 oz (97.433 kg)  05/30/14 226 lb 12.8 oz (102.876 kg)  05/28/14 218 lb (98.884 kg)    Glycemic control:   Lab Results  Component Value Date   HGBA1C 7.4* 08/22/2014   HGBA1C 8.8* 06/04/2014   HGBA1C 8.8* 05/16/2014   Lab Results  Component Value Date   LDLCALC 77 08/22/2014   CREATININE 4.37* 08/22/2014         Medication List       This list is accurate as of: 09/12/14  9:22 PM.  Always use your most recent med  list.               ACCU-CHEK SOFTCLIX LANCETS lancets  1 each by Other route 4 (four) times daily -  before meals and at bedtime. Use as instructed     Alcohol Preps Pads  1 each by Does not apply route 4 (four) times daily -  before meals and at bedtime.     amLODipine 10 MG tablet  Commonly known as:  NORVASC  TAKE 1 TABLET (10 MG TOTAL) BY MOUTH DAILY.     aspirin EC 81 MG tablet  Take 81 mg by mouth daily.     B-D INS SYR ULTRAFINE 1CC/31G 31G X 5/16" 1 ML Misc  Generic drug:  Insulin Syringe-Needle U-100  daily. for testing     B-D INS SYR ULTRAFINE 1CC/30G 30G X 1/2" 1 ML Misc  Generic drug:  Insulin Syringe-Needle U-100  See admin instructions.     blood glucose meter kit and supplies Kit  Dispense based on patient and insurance preference. Use up to four times daily as directed. (FOR ICD-9 250.00, 250.01).     BYSTOLIC 5 MG tablet  Generic drug:  nebivolol  TAKE 1 TABLET BY MOUTH EVERY DAY     calcitRIOL 0.5 MCG capsule  Commonly known as:  ROCALTROL  Take 0.5 mcg by mouth  daily.     cholecalciferol 1000 UNITS tablet  Commonly known as:  VITAMIN D  Take 1,000 Units by mouth daily.     FERREX 150 150 MG capsule  Generic drug:  iron polysaccharides  Take 150 mg by mouth 2 (two) times daily.     finasteride 5 MG tablet  Commonly known as:  PROSCAR  Take 1 tablet (5 mg total) by mouth daily.     furosemide 80 MG tablet  Commonly known as:  LASIX  Take 80 mg by mouth daily.     gabapentin 100 MG capsule  Commonly known as:  NEURONTIN  Take 100 mg by mouth at bedtime.     glucose blood test strip  1 each by Other route 4 (four) times daily -  before meals and at bedtime. Use as instructed     insulin detemir 100 UNIT/ML injection  Commonly known as:  LEVEMIR  Inject 0.1 mLs (10 Units total) into the skin at bedtime.     insulin NPH-regular Human (70-30) 100 UNIT/ML injection  Commonly known as:  NOVOLIN 70/30  Inject into the skin 3 (three) times daily with meals. 10 units at breakfast and 12 units and lunch and supper     oxyCODONE-acetaminophen 5-325 MG per tablet  Commonly known as:  PERCOCET/ROXICET  Take 1-2 tablets by mouth every 4 (four) hours as needed for moderate pain or severe pain.     pioglitazone 45 MG tablet  Commonly known as:  ACTOS  Take 45 mg by mouth daily.     sodium bicarbonate 650 MG tablet  Take 650 mg by mouth 3 (three) times daily.     ACCU-CHEK AVIVA Soln     SURECHEK CONTROL SOLUTION NORMAL Liqd  ACCU-CHEK AVIVA SOLUTION  USE AS DIRECTED        Allergies:  Allergies  Allergen Reactions  . Enalapril Other (See Comments)    Hyperkalemia.  . Metformin And Related Other (See Comments)    Renal insufficiency   . Tekturna [Aliskiren] Other (See Comments)    hyperkalemia    Past Medical History  Diagnosis Date  . Hypertension   .  Undescended testicle   . Vitamin D deficiency   . Prostate cancer   . Chronic hyperkalemia 12/23/2012    Sees Renal  . NIDDM (non-insulin dependent diabetes mellitus)   . CKD  (chronic kidney disease), stage IV     Past Surgical History  Procedure Laterality Date  . Foot amputation Right 2010    cut off 1/2 my foot"  . Refractive surgery Bilateral 2000's  . Av fistula placement Left 04/17/2013    Procedure: ARTERIOVENOUS (AV) FISTULA CREATION- LEFT RADIOCEPHALIC;  Surgeon: Rosetta Posner, MD;  Location: [facility];  Service: Vascular;  Laterality: Left;  . Shuntogram N/A 08/03/2013    Procedure: Earney Mallet;  Surgeon: Conrad [place], MD;  Location: [facility] CATH LAB;  Service: Cardiovascular;  Laterality: N/A;  . Colonoscopy    . Foot amputation Right 05/30/2014    transtibial  . Amputation Right 05/30/2014    Procedure: AMPUTATION BELOW KNEE;  Surgeon: Newt Minion, MD;  Location: [facility];  Service: Orthopedics;  Laterality: Right;    Family History  Problem Relation Age of Onset  . Cancer Sister     Social History:  reports that he quit smoking about 31 years ago. His smoking use included Cigarettes. He has a 25 pack-year smoking history. He has never used smokeless tobacco. He reports that he does not drink alcohol or use illicit drugs.    Review of Systems    Lipid history: Never on medications for this, has mild increase in triglycerides and low HDL       Lab Results  Component Value Date   CHOL 154 08/22/2014   HDL 37.20* 08/22/2014   LDLCALC 77 08/22/2014   TRIG 199.0* 08/22/2014   CHOLHDL 4 08/22/2014            Hypertension: This is being treated with Bystolic and amlodipine  Neurological:   Has mild numbness and  tingling in his feet, currently not taking gabapentin that had been prescribed    Diabetic foot exam in 07/2014 of the left foot shows markedly decreased monofilament sensation in the toes, absent on the fourth and fifth toes and and plantar surfaces, no skin lesions or ulcers on the feet and normal pedal pulses Mild club foot present     has chronic kidney disease  LABS:  No visits with results within 1 Week(s) from this visit. Latest  known visit with results is:  Office Visit on 08/22/2014  Component Date Value Ref Range Status  . Hgb A1c MFr Bld 08/22/2014 7.4* 4.6 - 6.5 % Final   Glycemic Control Guidelines for People with Diabetes:Non Diabetic:  <6%Goal of Therapy: <7%Additional Action Suggested:  >8%   . Sodium 08/22/2014 137  135 - 145 mEq/L Final  . Potassium 08/22/2014 4.3  3.5 - 5.1 mEq/L Final  . Chloride 08/22/2014 100  96 - 112 mEq/L Final  . CO2 08/22/2014 29  19 - 32 mEq/L Final  . Glucose, Bld 08/22/2014 189* 70 - 99 mg/dL Final  . BUN 08/22/2014 79* 6 - 23 mg/dL Final  . Creatinine, Ser 08/22/2014 4.37* 0.40 - 1.50 mg/dL Final  . Total Bilirubin 08/22/2014 0.3  0.2 - 1.2 mg/dL Final  . Alkaline Phosphatase 08/22/2014 44  39 - 117 U/L Final  . AST 08/22/2014 13  0 - 37 U/L Final  . ALT 08/22/2014 7  0 - 53 U/L Final  . Total Protein 08/22/2014 8.1  6.0 - 8.3 g/dL Final  . Albumin 08/22/2014 4.0  3.5 -  5.2 g/dL Final  . Calcium 08/22/2014 10.4  8.4 - 10.5 mg/dL Final  . GFR 08/22/2014 14.25* >60.00 mL/min Final  . Cholesterol 08/22/2014 154  0 - 200 mg/dL Final   ATP III Classification       Desirable:  < 200 mg/dL               Borderline High:  200 - 239 mg/dL          High:  > = 240 mg/dL  . Triglycerides 08/22/2014 199.0* 0.0 - 149.0 mg/dL Final   Normal:  <150 mg/dLBorderline High:  150 - 199 mg/dL  . HDL 08/22/2014 37.20* >39.00 mg/dL Final  . VLDL 08/22/2014 39.8  0.0 - 40.0 mg/dL Final  . LDL Cholesterol 08/22/2014 77  0 - 99 mg/dL Final  . Total CHOL/HDL Ratio 08/22/2014 4   Final                  Men          Women1/2 Average Risk     3.4          3.3Average Risk          5.0          4.42X Average Risk          9.6          7.13X Average Risk          15.0          11.0                      . NonHDL 08/22/2014 116.80   Final   NOTE:  Non-HDL goal should be 30 mg/dL higher than patient's LDL goal (i.e. LDL goal of < 70 mg/dL, would have non-HDL goal of < 100 mg/dL)  . POC Glucose 08/22/2014  197* 70 - 99 mg/dl Final    Physical Examination:  BP 126/62 mmHg  Pulse 75  Temp(Src) 98 F (36.7 C)  Resp 16  Ht   Wt   SpO2 97%           ASSESSMENT:  Diabetes type 2, uncontrolled with previous A1c around 8.8%     He is on a regimen of premixed insulin before meals and Levemir at bedtime    His blood sugars are difficult to assess as he did not bring his monitor and is not doing his readings after meals as directed again.   He probably has done somewhat better with increasing his mealtime insulin at lunch and supper and blood sugars fairly good today in the office in the late afternoon.      History of neuropathy:  He is wanting a diabetic shoe for his left leg and will full out the forms for this   Apparently wanting to start ablating soon with his new prosthesis  PLAN:   Start checking blood sugars at various times including after meals to help identify blood sugar patterns before adjusting insulin regimen  For now  Will not change his insulin regimen   Review home blood sugars and A1c again on the follow-up as it  Patient Instructions  Please check blood sugars at least half the time about 2 hours after any meal and 3 times per week on waking up.   Please bring blood sugar monitor to each visit. Recommended blood sugar levels about 2 hours after meal is 140-180 and on waking up 90-130     Jacquelyne Quarry  09/12/2014, 9:22 PM   Note: This office note was prepared with Dragon voice recognition system technology. Any transcriptional errors that result from this process are unintentional.

## 2014-09-19 ENCOUNTER — Encounter: Payer: Self-pay | Admitting: Physical Therapy

## 2014-09-19 ENCOUNTER — Ambulatory Visit: Payer: Medicare PPO | Attending: Orthopedic Surgery | Admitting: Physical Therapy

## 2014-09-19 DIAGNOSIS — R269 Unspecified abnormalities of gait and mobility: Secondary | ICD-10-CM | POA: Diagnosis present

## 2014-09-19 DIAGNOSIS — R531 Weakness: Secondary | ICD-10-CM | POA: Insufficient documentation

## 2014-09-19 DIAGNOSIS — Z89511 Acquired absence of right leg below knee: Secondary | ICD-10-CM | POA: Insufficient documentation

## 2014-09-19 DIAGNOSIS — R29818 Other symptoms and signs involving the nervous system: Secondary | ICD-10-CM | POA: Diagnosis not present

## 2014-09-19 DIAGNOSIS — R2689 Other abnormalities of gait and mobility: Secondary | ICD-10-CM

## 2014-09-19 DIAGNOSIS — R6889 Other general symptoms and signs: Secondary | ICD-10-CM | POA: Diagnosis not present

## 2014-09-20 NOTE — Therapy (Signed)
[facility] [address] [place], [place], [address] Phone: [phone]   Fax:  [phone]  Physical Therapy Evaluation  Patient Details  Name: Phillip Herring MRN: [id number] Date of Birth: Oct 10, 1940 Referring Provider:  Orlena Sheldon, PA-C  Encounter Date: 09/19/2014      PT End of Session - 09/19/14 1515    Visit Number 1   Number of Visits 17   Date for PT Re-Evaluation 11/18/14   PT Start Time 1497   PT Stop Time 1605   PT Time Calculation (min) 50 min   Equipment Utilized During Treatment Gait belt   Activity Tolerance Patient tolerated treatment well   Behavior During Therapy [facility] for tasks assessed/performed      Past Medical History  Diagnosis Date  . Hypertension   . Undescended testicle   . Vitamin D deficiency   . Prostate cancer   . Chronic hyperkalemia 12/23/2012    Sees Renal  . NIDDM (non-insulin dependent diabetes mellitus)   . CKD (chronic kidney disease), stage IV     Past Surgical History  Procedure Laterality Date  . Foot amputation Right 2010    cut off 1/2 my foot"  . Refractive surgery Bilateral 2000's  . Av fistula placement Left 04/17/2013    Procedure: ARTERIOVENOUS (AV) FISTULA CREATION- LEFT RADIOCEPHALIC;  Surgeon: Rosetta Posner, MD;  Location: [facility];  Service: Vascular;  Laterality: Left;  . Shuntogram N/A 08/03/2013    Procedure: Earney Mallet;  Surgeon: Conrad [place], MD;  Location: [facility] CATH LAB;  Service: Cardiovascular;  Laterality: N/A;  . Colonoscopy    . Foot amputation Right 05/30/2014    transtibial  . Amputation Right 05/30/2014    Procedure: AMPUTATION BELOW KNEE;  Surgeon: Newt Minion, MD;  Location: [facility];  Service: Orthopedics;  Laterality: Right;    There were no vitals filed for this visit.  Visit Diagnosis:  Abnormality of gait  Balance problems  Decreased functional activity tolerance  Weakness generalized  Status post below knee amputation of right lower  extremity      Subjective Assessment - 09/19/14 1520    Subjective This 74yo male underwent a right Transtibial Amputation on 05/30/2014 due to wound. He recieved his first prosthesis on 09/04/14. He is dependent in use & care. He presents for PT evaluation.   Patient is accompained by: Family member   Patient Stated Goals wants to use prosthesis to drive car, go to beach, walk in home & yard   Currently in Pain? No/denies            [facility] PT Assessment - 09/19/14 1515    Assessment   Medical Diagnosis right Transtibial Amputation prosthesis   Onset Date 09/04/14   Precautions   Precautions Other (comment);Fall  No BP left UE - dialysis fistula   Restrictions   Weight Bearing Restrictions No   Balance Screen   Has the patient fallen in the past 6 months No   Has the patient had a decrease in activity level because of a fear of falling?  No   Is the patient reluctant to leave their home because of a fear of falling?  No   Home Environment   Living Enviornment Private residence   Living Arrangements Alone   Available Help at Discharge Family   Type of [place] entrance  2nd entrance has 5 steps with 2 [place] One level  [place] - 2 wheels;Cane - single point;Crutches;Tub bench;Grab bars - tub/shower;Grab bars - toilet;Hand held shower head;Wheelchair - Press photographer   Prior Function   Level of Independence Independent with basic ADLs;Independent with homemaking with ambulation;Independent with gait;Independent with transfers  full community without device, golf   Observation/Other Assessments   Focus on Therapeutic Outcomes (FOTO)  30  functional status   Fear Avoidance Belief Questionnaire (FABQ)  47 (13)   Posture/Postural Control   Posture/Postural Control Postural limitations   Postural Limitations Rounded Shoulders;Forward head;Flexed trunk;Weight shift left   ROM / Strength   AROM / PROM / Strength  PROM;Strength   PROM   Overall PROM  Within functional limits for tasks performed   Strength   Overall Strength Within functional limits for tasks performed   Transfers   Transfers Sit to Stand;Stand to Sit   Sit to Stand 5: Supervision;With upper extremity assist;With armrests;From chair/3-in-1  presses back of legs against chair to stabilize   Stand to Sit 5: Supervision;With upper extremity assist;With armrests;To chair/3-in-1  from RW for stabilization   Ambulation/Gait   Ambulation/Gait Yes   Ambulation/Gait Assistance 5: Supervision   Ambulation Distance (Feet) 100 Feet   Assistive device Prosthesis;Rolling walker   Gait Pattern Step-to pattern;Decreased step length - left;Decreased stance time - right;Decreased stride length;Decreased hip/knee flexion - right;Decreased weight shift to right;Right steppage;Lateral hip instability;Trunk flexed;Abducted- right;Poor foot clearance - right   Ambulation Surface Indoor;Level   Gait velocity 1.18 ft/sec   Standardized Balance Assessment   Standardized Balance Assessment Berg Balance Test   Berg Balance Test   Sit to Stand Needs minimal aid to stand or to stabilize  uses back of legs against chair to stabilize   Standing Unsupported Needs several tries to stand 30 seconds unsupported  stands 2 min with RW safely   Sitting with Back Unsupported but Feet Supported on Floor or Stool Able to sit safely and securely 2 minutes   Stand to Sit Uses backs of legs against chair to control descent  uses RW for stability   Transfers Able to transfer safely, definite need of hands   Standing Unsupported with Eyes Closed Needs help to keep from falling  with RW 10 sec safely   Standing Ubsupported with Feet Together Needs help to attain position and unable to hold for 15 seconds  with RW safe 1 min   From Standing, Reach Forward with Outstretched Arm Loses balance while trying/requires external support  reaches 10" with RW   From Standing  Position, Pick up Object from Floor Unable to try/needs assist to keep balance  safe with RW   From Standing Position, Turn to Look Behind Over each Shoulder Needs assist to keep from losing balance and falling  looks behind one side only with RW   Turn 360 Degrees Needs assistance while turning  needs contact assist with RW   Standing Unsupported, Alternately Place Feet on Step/Stool Needs assistance to keep from falling or unable to try   Standing Unsupported, One Foot in [employer] balance while stepping or standing  small step with RW   Standing on One Leg Unable to try or needs assist to prevent fall  10 sec with RW   Total Score 11         Prosthetics Assessment - 09/19/14 1515    Prosthetics   Prosthetic Care Dependent with Skin check;Residual limb care;Care of non-amputated limb;Prosthetic cleaning;Ply sock cleaning;Correct ply sock adjustment;Proper wear schedule/adjustment;Proper weight-bearing schedule/adjustment;Other (comment)  donning prosthesis   Donning prosthesis  Supervision   Doffing prosthesis  Supervision   Current prosthetic wear tolerance (days/week)  worn prosthesis ~12 of 15 days since delivery   Current prosthetic wear tolerance (#hours/day)  15 min    Current prosthetic weight-bearing tolerance (hours/day)  tolerated standing 8 min with PWB on prosthesis with RW without pain or discomfort   Edema non-pitting   Residual limb condition  dry skin, smalls scabs along incision                  [facility] Adult PT Treatment/Exercise - 09/19/14 1515    Prosthetics   Education Provided Skin check;Residual limb care;Prosthetic cleaning;Correct ply sock adjustment;Proper wear schedule/adjustment;Other (comment)  donning prosthesis   Person(s) Educated Patient;Other (comment)  sister   Education Method Explanation;Demonstration;Tactile cues;Verbal cues   Education Method Verbalized understanding;Returned demonstration;Tactile cues required;Verbal cues  required;Needs further instruction                  PT Short Term Goals - 09/19/14 1515    PT SHORT TERM GOAL #1   Title Patient verbalizes basic understanding of prosthetic care with PT cues for adjusting ply socks & problem solving issues. (Target Date: 10/19/2014)   Time 4   Period Weeks   Status New   PT SHORT TERM GOAL #2   Title tolerates wear >8 hours /day daily with no skin issues nor undue discomfort. (Target Date: 10/19/2014)   Time 4   Period Weeks   Status New   PT SHORT TERM GOAL #3   Title reaches 10" & to floor without UE support with supervision. (Target Date: 10/19/2014)   Time 4   Period Weeks   Status New   PT SHORT TERM GOAL #4   Title ambulates 500' with LRAD & prosthesis with supervision / cues. (Target Date: 10/19/2014)   Time 4   Period Weeks   Status New   PT SHORT TERM GOAL #5   Title negotiates ramp, curb and stairs with LRAD & prosthesis with supervision. (Target Date: 10/19/2014)   Time 4   Period Weeks   Status New           PT Long Term Goals - 09/19/14 1515    PT LONG TERM GOAL #1   Title independent in prosthetic care. (Target Date: 11/16/2014)   Time 8   Period Weeks   Status New   PT LONG TERM GOAL #2   Title Tolerates wear of prosthesis >90% of awake hours without skin issues or discomfort. (Target Date: 11/16/2014)   Time 8   Period Weeks   Status New   PT LONG TERM GOAL #3   Title Berg Balance >45/56 (Target Date: 11/16/2014)   Time 8   Period Weeks   Status New   PT LONG TERM GOAL #4   Title ambulates around household furniture carrying objects without device except prosthesis independently. (Target Date: 11/16/2014)   Time 8   Period Weeks   Status New   PT LONG TERM GOAL #5   Title ambulates 1000' on various surfaces with single point cane & prosthesis modified independent. (Target Date: 11/16/2014)   Time 8   Period Weeks   Status New   Additional Long Term Goals   Additional Long Term Goals Yes   PT LONG TERM  GOAL #6   Title negotiates barriers including ramp, curb & stairs with single point cane & prosthesis modified independent. (Target Date: 11/16/2014)   Time 8  Period Weeks   Status New   PT LONG TERM GOAL #7   Title demonstrates proper technique to safely lift, carry & climb A-frame ladders. (Target Date: 11/16/2014)   Time 8   Period Weeks   Status New               Plan - 09-29-2014 1515    Clinical Impression Statement This 74yo male recieved his first prosthesis 2 weeks ago and is dependent in use & care. Patient is dependent in balance & gait with prosthesis with risk of falls.    Pt will benefit from skilled therapeutic intervention in order to improve on the following deficits Abnormal gait;Decreased activity tolerance;Decreased balance;Decreased endurance;Decreased knowledge of use of DME;Decreased mobility;Decreased strength;Postural dysfunction;Other (comment)  prosthetic dependency   Rehab Potential Good   PT Frequency 2x / week   PT Duration 8 weeks   PT Treatment/Interventions ADLs/Self Care Home Management;DME Instruction;Gait training;Stair training;Functional mobility training;Therapeutic activities;Therapeutic exercise;Balance training;Neuromuscular re-education;Patient/family education;Other (comment)  prosthetic training   PT Next Visit Plan review prosthetic care, gait with RW including barriers   PT Home Exercise Plan midline at sink   Consulted and Agree with Plan of Care Patient;Family member/caregiver   Family Member Consulted sister          G-Codes - 2014-09-29 1515    Functional Assessment Tool Used dependent in prosthetic care, wears prosthesis 12 of 15 days since delviery for 15 min.   Functional Limitation Self care   Self Care Current Status [phone]) At least 80 percent but less than 100 percent impaired, limited or restricted   Self Care Goal Status (Q7341) At least 1 percent but less than 20 percent impaired, limited or restricted        Problem List Patient Active Problem List   Diagnosis Date Noted  . CKD stage 4 due to type 2 diabetes mellitus 08/22/2014  . S/P BKA (below knee amputation) unilateral 05/30/2014  . Complete amputation of right foot 04/23/2014  . End stage renal disease 04/11/2013  . Chronic hyperkalemia   . CKD (chronic kidney disease)   . Vitamin D deficiency   . Diabetes mellitus type 2 with complications   . Hypertension   . Prostate cancer   . Undescended testicle     Norleen Xie PT, DPT 09/20/2014, 8:26 AM  [facility] [address] [place], [place], [address] Phone: [phone]   Fax:  [phone]

## 2014-09-25 ENCOUNTER — Ambulatory Visit: Payer: Medicare PPO | Attending: Orthopedic Surgery

## 2014-09-25 DIAGNOSIS — R2689 Other abnormalities of gait and mobility: Secondary | ICD-10-CM

## 2014-09-25 DIAGNOSIS — R29818 Other symptoms and signs involving the nervous system: Secondary | ICD-10-CM | POA: Insufficient documentation

## 2014-09-25 DIAGNOSIS — R531 Weakness: Secondary | ICD-10-CM | POA: Diagnosis not present

## 2014-09-25 DIAGNOSIS — R269 Unspecified abnormalities of gait and mobility: Secondary | ICD-10-CM

## 2014-09-25 DIAGNOSIS — Z89511 Acquired absence of right leg below knee: Secondary | ICD-10-CM | POA: Diagnosis not present

## 2014-09-25 DIAGNOSIS — R6889 Other general symptoms and signs: Secondary | ICD-10-CM | POA: Insufficient documentation

## 2014-09-25 NOTE — Patient Instructions (Signed)
Do each exercise 1-2  times per day Do each exercise 10-15 repetitions Hold each exercise for 2-3 seconds to feel your location  AT [place].  USE TAPE ON FLOOR TO MARK THE MIDLINE POSITION. You also should try to feel with your limb pressure in socket.  You are trying to feel with limb what you used to feel with the bottom of your foot.  1. Side to Side Shift: Moving your hips only (not shoulders): move weight onto your left leg, HOLD/FEEL.  Move back to equal weight on each leg, HOLD/FEEL. Move weight onto your right leg, HOLD/FEEL. Move back to equal weight on each leg, HOLD/FEEL. Repeat. Progress to holding on with Right hand and then no hand support. 2. Front to Back Shift: Moving your hips only (not shoulders): move your weight forward onto your toes, HOLD/FEEL. Move your weight back to equal Flat Foot on both legs, HOLD/FEEL. Move your weight back onto your heels, HOLD/FEEL. Move your weight back to equal on both legs, HOLD/FEEL. Repeat. Progress to holding on with Right hand and then no hand support. 3. Moving Cones / Cups: With equal weight on each leg: Hold on with one hand the first time, then progress to no hand supports. Move cups from one side of sink to the other. Place cups ~2" out of your reach, progress to 10" beyond reach. 4. Overhead/Upward Reaching: alternated reaching up to top cabinets or ceiling if no cabinets present. Keep equal weight on each leg. Start with one hand support on counter while other hand reaches and progress to no hand support with reaching. 5.   Looking Over Shoulders: With equal weight on each leg: alternate turning to look over your shoulders with one hand support on counter as needed. Shift weight to side looking, pull hip then shoulder then head/eyes around to look behind you. Start with one hand support & progress to no hand support.

## 2014-09-25 NOTE — Therapy (Signed)
[facility] [address] [place], [place], [address] Phone: [phone]   Fax:  [phone]  Physical Therapy Treatment  Patient Details  Name: Phillip Herring MRN: [id number] Date of Birth: 1941-03-11 Referring Provider:  Orlena Sheldon, PA-C  Encounter Date: 09/25/2014      PT End of Session - 09/25/14 1643    Visit Number 2   Number of Visits 17   Date for PT Re-Evaluation 11/18/14   Authorization Type G-code every 10th visit. [employer]   PT Start Time 2836   PT Stop Time 1531   PT Time Calculation (min) 44 min   Equipment Utilized During Treatment --  PT utilized assist at pt's hips.   Activity Tolerance Patient tolerated treatment well   Behavior During Therapy [facility] for tasks assessed/performed      Past Medical History  Diagnosis Date  . Hypertension   . Undescended testicle   . Vitamin D deficiency   . Prostate cancer   . Chronic hyperkalemia 12/23/2012    Sees Renal  . NIDDM (non-insulin dependent diabetes mellitus)   . CKD (chronic kidney disease), stage IV     Past Surgical History  Procedure Laterality Date  . Foot amputation Right 2010    cut off 1/2 my foot"  . Refractive surgery Bilateral 2000's  . Av fistula placement Left 04/17/2013    Procedure: ARTERIOVENOUS (AV) FISTULA CREATION- LEFT RADIOCEPHALIC;  Surgeon: Rosetta Posner, MD;  Location: [facility];  Service: Vascular;  Laterality: Left;  . Shuntogram N/A 08/03/2013    Procedure: Earney Mallet;  Surgeon: Conrad Rancho Alegre, MD;  Location: [facility] CATH LAB;  Service: Cardiovascular;  Laterality: N/A;  . Colonoscopy    . Foot amputation Right 05/30/2014    transtibial  . Amputation Right 05/30/2014    Procedure: AMPUTATION BELOW KNEE;  Surgeon: Newt Minion, MD;  Location: [facility];  Service: Orthopedics;  Laterality: Right;    There were no vitals filed for this visit.  Visit Diagnosis:  Decreased functional activity tolerance  Balance problems  Abnormality of  gait  Weakness generalized  Status post below knee amputation of right lower extremity      Subjective Assessment - 09/25/14 1454    Subjective Pt denied falls or changes since last visit. Pt walking to kitchen from living room a few times a day. Pt has been wearing R prosthesis 2x/day for 2 hours and washing liner per instructed. Pt reported he has an appointment with his eye doctor, as he is having difficulty seeing far away. Pt states he wants to ambulate on home ramp.   Patient Stated Goals wants to use prosthesis to drive car, go to beach, walk in home & yard   Currently in Pain? No/denies      Prosthetic training: The following exercises were performed at counter/sink with pt progressing from min guard and tactile cues to supervision. Each performed with pt progressing from 2 UE support, then 1 UE support and then no UE support. VC's to improve R LE weight shifting. Each performed 10-15 reps per activity. Pt required two seated rest breaks due to fatigue. Exercises then provided to pt for HEP.  AT [place] AND PLACE FEET EQUAL DISTANCE FROM THE MIDLINE.  USE TAPE ON FLOOR TO MARK THE MIDLINE POSITION. You also should try to feel with your limb pressure in socket.  You are trying to feel with limb what you used to feel with the bottom of  your foot.  1. Side to Side Shift: Moving your hips only (not shoulders): move weight onto your left leg, HOLD/FEEL.  Move back to equal weight on each leg, HOLD/FEEL. Move weight onto your right leg, HOLD/FEEL. Move back to equal weight on each leg, HOLD/FEEL. Repeat. Progress to holding on with Right hand and then no hand support. 2. Front to Back Shift: Moving your hips only (not shoulders): move your weight forward onto your toes, HOLD/FEEL. Move your weight back to equal Flat Foot on both legs, HOLD/FEEL. Move your weight back onto your heels, HOLD/FEEL. Move your weight back to equal on both legs, HOLD/FEEL. Repeat. Progress  to holding on with Right hand and then no hand support. 3. Moving Cones / Cups: With equal weight on each leg: Hold on with one hand the first time, then progress to no hand supports. Move cups from one side of sink to the other. Place cups ~2" out of your reach, progress to 10" beyond reach. 4. Overhead/Upward Reaching: alternated reaching up to top cabinets or ceiling if no cabinets present. Keep equal weight on each leg. Start with one hand support on counter while other hand reaches and progress to no hand support with reaching. 5.   Looking Over Shoulders: With equal weight on each leg: alternate turning to look over your shoulders with one hand support on counter as needed. Shift weight to side looking, pull hip then shoulder then head/eyes around to look behind you. Start with one hand support & progress to no hand support.                   [facility] Adult PT Treatment/Exercise - 09/25/14 1652    Ambulation/Gait   Ambulation/Gait Yes   Ambulation/Gait Assistance 5: Supervision   Ambulation/Gait Assistance Details R prosthesis donned for stairs, curb and amb.Cues for upright posture and sequencing with RW.   Ambulation Distance (Feet) --  10x2   Assistive device Prosthesis;Rolling walker   Gait Pattern Step-to pattern;Decreased step length - left;Decreased stance time - right;Decreased stride length;Decreased hip/knee flexion - right;Decreased weight shift to right;Right steppage;Lateral hip instability;Trunk flexed;Abducted- right;Poor foot clearance - right   Ambulation Surface Level;Indoor   Stairs Yes   Stairs Assistance 4: Min guard;4: Min assist   Stairs Assistance Details (indicate cue type and reason) Min A during one LOB, when pt tried to descend steps with R LE. VC's for sequencing.   Stair Management Technique One rail Right;Step to pattern   Number of Stairs 8   Height of Stairs 6   Curb Other (comment)  min guard and RW, cues for sequencing.                 PT Education - 09/25/14 1641    Education provided Yes   Education Details Sink HEP. PT reiterated the importance of cleaning liner and using lotion on R LE, as pt's liner had dry skin but pt reported it was from wearing liner eariler today. PT also re-educated on how to properly donn prosthesis and on wearing prosthesis 2x/day for 2 hours at a time. PT informed pt on waiting to ambulate on ramp at home, until he can safely traverse stairs, curb, and ambulate over even surfaces.   Person(s) Educated Patient   Methods Explanation;Demonstration;Tactile cues;Verbal cues;Handout   Comprehension Verbalized understanding;Returned demonstration          PT Short Term Goals - 09/25/14 1646    PT SHORT TERM GOAL #1   Title Patient  verbalizes basic understanding of prosthetic care with PT cues for adjusting ply socks & problem solving issues. (Target Date: 10/19/2014)   Time 4   Period Weeks   Status On-going   PT SHORT TERM GOAL #2   Title tolerates wear >8 hours /day daily with no skin issues nor undue discomfort. (Target Date: 10/19/2014)   Time 4   Period Weeks   Status On-going   PT SHORT TERM GOAL #3   Title reaches 10" & to floor without UE support with supervision. (Target Date: 10/19/2014)   Time 4   Period Weeks   Status On-going   PT SHORT TERM GOAL #4   Title ambulates 500' with LRAD & prosthesis with supervision / cues. (Target Date: 10/19/2014)   Time 4   Period Weeks   Status On-going   PT SHORT TERM GOAL #5   Title negotiates ramp, curb and stairs with LRAD & prosthesis with supervision. (Target Date: 10/19/2014)   Time 4   Period Weeks   Status On-going           PT Long Term Goals - 09/25/14 1647    PT LONG TERM GOAL #1   Title independent in prosthetic care. (Target Date: 11/16/2014)   Time 8   Period Weeks   Status On-going   PT LONG TERM GOAL #2   Title Tolerates wear of prosthesis >90% of awake hours without skin issues or discomfort. (Target Date:  11/16/2014)   Time 8   Period Weeks   Status On-going   PT LONG TERM GOAL #3   Title Berg Balance >45/56 (Target Date: 11/16/2014)   Time 8   Period Weeks   Status On-going   PT LONG TERM GOAL #4   Title ambulates around household furniture carrying objects without device except prosthesis independently. (Target Date: 11/16/2014)   Time 8   Period Weeks   Status On-going   PT LONG TERM GOAL #5   Title ambulates 1000' on various surfaces with single point cane & prosthesis modified independent. (Target Date: 11/16/2014)   Time 8   Period Weeks   Status On-going   PT LONG TERM GOAL #6   Title negotiates barriers including ramp, curb & stairs with single point cane & prosthesis modified independent. (Target Date: 11/16/2014)   Time 8   Period Weeks   Status On-going   PT LONG TERM GOAL #7   Title demonstrates proper technique to safely lift, carry & climb A-frame ladders. (Target Date: 11/16/2014)   Time 8   Period Weeks   Status On-going               Plan - 09/25/14 1644    Clinical Impression Statement Pt demonstrated progress as he is wearing prosthesis 2x/day for 2 hours at a time. Pt continues to experience decreased weight bearing through R LE, but progressed from tactile cues to verbal cues during sink exercises. Pt would continue to benefit from skilled PT to improve safety during functional mobility and to gain independenc in using prosthesis.   Pt will benefit from skilled therapeutic intervention in order to improve on the following deficits Abnormal gait;Decreased activity tolerance;Decreased balance;Decreased endurance;Decreased knowledge of use of DME;Decreased mobility;Decreased strength;Postural dysfunction;Other (comment)  prosthetic dependency   Rehab Potential Good   PT Frequency 2x / week   PT Duration 8 weeks   PT Treatment/Interventions ADLs/Self Care Home Management;DME Instruction;Gait training;Stair training;Functional mobility training;Therapeutic  activities;Therapeutic exercise;Balance training;Neuromuscular re-education;Patient/family education;Other (comment)  prosthetic training   PT  Next Visit Plan review prosthetic care, gait with RW including barriers   PT Home Exercise Plan midline at sink   Consulted and Agree with Plan of Care Patient   Family Member Consulted sister        Problem List Patient Active Problem List   Diagnosis Date Noted  . CKD stage 4 due to type 2 diabetes mellitus 08/22/2014  . S/P BKA (below knee amputation) unilateral 05/30/2014  . Complete amputation of right foot 04/23/2014  . End stage renal disease 04/11/2013  . Chronic hyperkalemia   . CKD (chronic kidney disease)   . Vitamin D deficiency   . Diabetes mellitus type 2 with complications   . Hypertension   . Prostate cancer   . Undescended testicle     Miller,Jennifer L 09/25/2014, 4:54 PM  [facility] [address] [place], [place], [address] Phone: [phone]   Fax:  [phone]     Geoffry Paradise, PT,DPT 09/25/2014 4:54 PM Phone: [phone] Fax: [phone]

## 2014-09-28 ENCOUNTER — Ambulatory Visit: Payer: Medicare PPO

## 2014-09-28 DIAGNOSIS — R269 Unspecified abnormalities of gait and mobility: Secondary | ICD-10-CM | POA: Diagnosis not present

## 2014-09-28 DIAGNOSIS — R6889 Other general symptoms and signs: Secondary | ICD-10-CM

## 2014-09-28 DIAGNOSIS — Z89511 Acquired absence of right leg below knee: Secondary | ICD-10-CM

## 2014-09-28 DIAGNOSIS — R2689 Other abnormalities of gait and mobility: Secondary | ICD-10-CM

## 2014-09-28 DIAGNOSIS — R531 Weakness: Secondary | ICD-10-CM

## 2014-09-28 NOTE — Therapy (Signed)
[facility] [address] [place], [place], [address] Phone: [phone]   Fax:  [phone]  Physical Therapy Treatment  Patient Details  Name: Phillip Herring MRN: [id number] Date of Birth: 16-Sep-1940 Referring Provider:  Orlena Sheldon, PA-C  Encounter Date: 09/28/2014      PT End of Session - 09/28/14 1412    Visit Number 3   Number of Visits 17   Date for PT Re-Evaluation 11/18/14   Authorization Type G-code every 10th visit. [employer]   PT Start Time 1316   PT Stop Time 1357   PT Time Calculation (min) 41 min   Equipment Utilized During Treatment Gait belt   Activity Tolerance Patient tolerated treatment well   Behavior During Therapy WFL for tasks assessed/performed      Past Medical History  Diagnosis Date  . Hypertension   . Undescended testicle   . Vitamin D deficiency   . Prostate cancer   . Chronic hyperkalemia 12/23/2012    Sees Renal  . NIDDM (non-insulin dependent diabetes mellitus)   . CKD (chronic kidney disease), stage IV     Past Surgical History  Procedure Laterality Date  . Foot amputation Right 2010    cut off 1/2 my foot"  . Refractive surgery Bilateral 2000's  . Av fistula placement Left 04/17/2013    Procedure: ARTERIOVENOUS (AV) FISTULA CREATION- LEFT RADIOCEPHALIC;  Surgeon: Rosetta Posner, MD;  Location: [facility];  Service: Vascular;  Laterality: Left;  . Shuntogram N/A 08/03/2013    Procedure: Earney Mallet;  Surgeon: Conrad Greer, MD;  Location: [facility] CATH LAB;  Service: Cardiovascular;  Laterality: N/A;  . Colonoscopy    . Foot amputation Right 05/30/2014    transtibial  . Amputation Right 05/30/2014    Procedure: AMPUTATION BELOW KNEE;  Surgeon: Newt Minion, MD;  Location: [facility];  Service: Orthopedics;  Laterality: Right;    There were no vitals filed for this visit.  Visit Diagnosis:  Abnormality of gait  Decreased functional activity tolerance  Balance problems  Weakness  generalized  Status post below knee amputation of right lower extremity      Subjective Assessment - 09/28/14 1319    Subjective Pt denied falls or changes since last visit. Pt reported he is still wearing R prosthesis for 2 hours, 2x/day. Pt also reported he is using lotion on R limb for dry skin. Pt performed exercises but would like to make sure he's performing them correctly.   Patient is accompained by: Family member   Patient Stated Goals wants to use prosthesis to drive car, go to beach, walk in home & yard   Currently in Pain? No/denies                         [facility] Adult PT Treatment/Exercise - 09/28/14 1322    Ambulation/Gait   Ambulation/Gait Yes   Ambulation/Gait Assistance 5: Supervision;4: Min guard   Ambulation/Gait Assistance Details R prosthesis donned. VC's to decrease R hip ER during mid to terminal stance and to improve upright posture. Pt required three seated rest break during ambulation due to fatigue.   Ambulation Distance (Feet) --  230', 115', 30', 117'   Assistive device Prosthesis;Rolling walker   Gait Pattern Step-to pattern;Decreased step length - left;Decreased stance time - right;Decreased stride length;Decreased hip/knee flexion - right;Decreased weight shift to right;Right steppage;Lateral hip instability;Trunk flexed;Abducted- right;Poor foot clearance - right   Ambulation Surface Level;Indoor   Stairs  Yes   Stairs Assistance 4: Min guard   Stairs Assistance Details (indicate cue type and reason) Min guard to ensure safety. VC's for sequencing.   Stair Management Technique Two rails;One rail Right;Step to pattern   Number of Stairs 8   Height of Stairs 6                PT Education - 09/28/14 1411    Education provided Yes   Education Details PT reviewed sink HEP per pt request. PT also reiterated the importance of not traversing ramp until pt is proficient and safe with ramp during PT sessions.    Person(s) Educated  Patient   Methods Explanation;Demonstration;Tactile cues;Verbal cues;Handout   Comprehension Verbalized understanding;Returned demonstration          PT Short Term Goals - 09/25/14 1646    PT SHORT TERM GOAL #1   Title Patient verbalizes basic understanding of prosthetic care with PT cues for adjusting ply socks & problem solving issues. (Target Date: 10/19/2014)   Time 4   Period Weeks   Status On-going   PT SHORT TERM GOAL #2   Title tolerates wear >8 hours /day daily with no skin issues nor undue discomfort. (Target Date: 10/19/2014)   Time 4   Period Weeks   Status On-going   PT SHORT TERM GOAL #3   Title reaches 10" & to floor without UE support with supervision. (Target Date: 10/19/2014)   Time 4   Period Weeks   Status On-going   PT SHORT TERM GOAL #4   Title ambulates 500' with LRAD & prosthesis with supervision / cues. (Target Date: 10/19/2014)   Time 4   Period Weeks   Status On-going   PT SHORT TERM GOAL #5   Title negotiates ramp, curb and stairs with LRAD & prosthesis with supervision. (Target Date: 10/19/2014)   Time 4   Period Weeks   Status On-going           PT Long Term Goals - 09/25/14 1647    PT LONG TERM GOAL #1   Title independent in prosthetic care. (Target Date: 11/16/2014)   Time 8   Period Weeks   Status On-going   PT LONG TERM GOAL #2   Title Tolerates wear of prosthesis >90% of awake hours without skin issues or discomfort. (Target Date: 11/16/2014)   Time 8   Period Weeks   Status On-going   PT LONG TERM GOAL #3   Title Berg Balance >45/56 (Target Date: 11/16/2014)   Time 8   Period Weeks   Status On-going   PT LONG TERM GOAL #4   Title ambulates around household furniture carrying objects without device except prosthesis independently. (Target Date: 11/16/2014)   Time 8   Period Weeks   Status On-going   PT LONG TERM GOAL #5   Title ambulates 1000' on various surfaces with single point cane & prosthesis modified independent. (Target  Date: 11/16/2014)   Time 8   Period Weeks   Status On-going   PT LONG TERM GOAL #6   Title negotiates barriers including ramp, curb & stairs with single point cane & prosthesis modified independent. (Target Date: 11/16/2014)   Time 8   Period Weeks   Status On-going   PT LONG TERM GOAL #7   Title demonstrates proper technique to safely lift, carry & climb A-frame ladders. (Target Date: 11/16/2014)   Time 8   Period Weeks   Status On-going  Plan - 09/28/14 1412    Clinical Impression Statement Pt very motivated and demonstrated progress towards goals, as he required less cues and assist while traversing stairs. Pt was also able to ambulate longer distances today. Pt would continue to benefit from skilled PT to improve safety during functional mobilty and to gain independence in using prosthesis.   Pt will benefit from skilled therapeutic intervention in order to improve on the following deficits Abnormal gait;Decreased activity tolerance;Decreased balance;Decreased endurance;Decreased knowledge of use of DME;Decreased mobility;Decreased strength;Postural dysfunction;Other (comment)  prosthetic dependency   Rehab Potential Good   PT Frequency 2x / week   PT Duration 8 weeks   PT Treatment/Interventions ADLs/Self Care Home Management;DME Instruction;Gait training;Stair training;Functional mobility training;Therapeutic activities;Therapeutic exercise;Balance training;Neuromuscular re-education;Patient/family education;Other (comment)  prosthetic training   PT Next Visit Plan review prosthetic care, gait with RW including barriers (try curb and ramp)   PT Home Exercise Plan midline at sink   Consulted and Agree with Plan of Care Patient        Problem List Patient Active Problem List   Diagnosis Date Noted  . CKD stage 4 due to type 2 diabetes mellitus 08/22/2014  . S/P BKA (below knee amputation) unilateral 05/30/2014  . Complete amputation of right foot 04/23/2014   . End stage renal disease 04/11/2013  . Chronic hyperkalemia   . CKD (chronic kidney disease)   . Vitamin D deficiency   . Diabetes mellitus type 2 with complications   . Hypertension   . Prostate cancer   . Undescended testicle     Shavanna Furnari L 09/28/2014, 2:15 PM  [facility] [address] [place], [place], [address] Phone: [phone]   Fax:  [phone]     Geoffry Paradise, PT,DPT 09/28/2014 2:15 PM Phone: [phone] Fax: [phone]

## 2014-10-02 ENCOUNTER — Ambulatory Visit: Payer: Medicare PPO | Admitting: Physical Therapy

## 2014-10-02 ENCOUNTER — Other Ambulatory Visit: Payer: Self-pay | Admitting: Family Medicine

## 2014-10-02 ENCOUNTER — Other Ambulatory Visit: Payer: Self-pay | Admitting: *Deleted

## 2014-10-02 ENCOUNTER — Encounter: Payer: Self-pay | Admitting: Physical Therapy

## 2014-10-02 DIAGNOSIS — R269 Unspecified abnormalities of gait and mobility: Secondary | ICD-10-CM | POA: Diagnosis not present

## 2014-10-02 DIAGNOSIS — R6889 Other general symptoms and signs: Secondary | ICD-10-CM

## 2014-10-02 DIAGNOSIS — Z89511 Acquired absence of right leg below knee: Secondary | ICD-10-CM

## 2014-10-02 DIAGNOSIS — R531 Weakness: Secondary | ICD-10-CM

## 2014-10-02 DIAGNOSIS — R2689 Other abnormalities of gait and mobility: Secondary | ICD-10-CM

## 2014-10-02 MED ORDER — PIOGLITAZONE HCL 45 MG PO TABS
45.0000 mg | ORAL_TABLET | Freq: Every day | ORAL | Status: AC
Start: 2014-10-02 — End: ?

## 2014-10-02 NOTE — Therapy (Signed)
[facility] [address] [place], [place], [address] Phone: [phone]   Fax:  [phone]  Physical Therapy Treatment  Patient Details  Name: Phillip Herring MRN: [id number] Date of Birth: 24-Aug-1940 Referring Provider:  Orlena Sheldon, PA-C  Encounter Date: 10/02/2014      PT End of Session - 10/02/14 1445    Visit Number 4   Number of Visits 17   Date for PT Re-Evaluation 11/18/14   Authorization Type G-code every 10th visit. [employer]   PT Start Time 6789   PT Stop Time 1530   PT Time Calculation (min) 45 min   Equipment Utilized During Treatment Gait belt   Activity Tolerance Patient tolerated treatment well   Behavior During Therapy WFL for tasks assessed/performed      Past Medical History  Diagnosis Date  . Hypertension   . Undescended testicle   . Vitamin D deficiency   . Prostate cancer   . Chronic hyperkalemia 12/23/2012    Sees Renal  . NIDDM (non-insulin dependent diabetes mellitus)   . CKD (chronic kidney disease), stage IV     Past Surgical History  Procedure Laterality Date  . Foot amputation Right 2010    cut off 1/2 my foot"  . Refractive surgery Bilateral 2000's  . Av fistula placement Left 04/17/2013    Procedure: ARTERIOVENOUS (AV) FISTULA CREATION- LEFT RADIOCEPHALIC;  Surgeon: Rosetta Posner, MD;  Location: [facility];  Service: Vascular;  Laterality: Left;  . Shuntogram N/A 08/03/2013    Procedure: Earney Mallet;  Surgeon: Conrad [place], MD;  Location: [facility] CATH LAB;  Service: Cardiovascular;  Laterality: N/A;  . Colonoscopy    . Foot amputation Right 05/30/2014    transtibial  . Amputation Right 05/30/2014    Procedure: AMPUTATION BELOW KNEE;  Surgeon: Newt Minion, MD;  Location: [facility];  Service: Orthopedics;  Laterality: Right;    There were no vitals filed for this visit.  Visit Diagnosis:  Abnormality of gait  Decreased functional activity tolerance  Balance problems  Weakness  generalized  Status post below knee amputation of right lower extremity      Subjective Assessment - 10/02/14 1455    Subjective (p) Wearing prosthesis 2 hrs 2 xday without issues. Saw prosthetist today and he is pleased with progress.   Currently in Pain? (p) No/denies      Prosthetic Training: Sit to stand with  Supervision  from chairs with armrests and 24" stool without use of UEs. Stand to sit with  Modified Independent  to chairs with armrests. Patient ambulated with Supervision Device: Rolling walker & prosthesis  Distance: 250' on Indoor  Level surface.  Multi-modal cues for deviations: proper step length  Patient ambulated with Min. Assist Device: Single point cane & prosthesis  Distance: 100' on Indoor  Level surface, initiated along counter with right UE support. Patient negotiated ramp with Supervision Device: Rolling walker & prosthesis  Patient negotiated curb with Supervision Device: Rolling walker & prosthesis  Patient negotiated stairs with Supervision Device: Two handrails & prosthesis step-to pattern  Neuromuscular Re-education: In parallel bars with occasional touch with tactile & visual (mirror) cues. Standing balance with red theraband reciprocal & BUE row, upward reach & forward reach; sitting on 24" stool reaching forward to floor then righting trunk with back extension and backwards lean with recovery.  PT Education - 10/02/14 1445    Education provided Yes   Education Details increase wear to 3 hrs 2x/day,    Person(s) Educated Patient   Methods Explanation   Comprehension Verbalized understanding          PT Short Term Goals - 09/25/14 1646    PT SHORT TERM GOAL #1   Title Patient verbalizes basic understanding of prosthetic care with PT cues for adjusting ply socks & problem solving issues. (Target Date: 10/19/2014)   Time 4   Period Weeks   Status On-going   PT SHORT TERM GOAL #2   Title tolerates wear >8  hours /day daily with no skin issues nor undue discomfort. (Target Date: 10/19/2014)   Time 4   Period Weeks   Status On-going   PT SHORT TERM GOAL #3   Title reaches 10" & to floor without UE support with supervision. (Target Date: 10/19/2014)   Time 4   Period Weeks   Status On-going   PT SHORT TERM GOAL #4   Title ambulates 500' with LRAD & prosthesis with supervision / cues. (Target Date: 10/19/2014)   Time 4   Period Weeks   Status On-going   PT SHORT TERM GOAL #5   Title negotiates ramp, curb and stairs with LRAD & prosthesis with supervision. (Target Date: 10/19/2014)   Time 4   Period Weeks   Status On-going           PT Long Term Goals - 09/25/14 1647    PT LONG TERM GOAL #1   Title independent in prosthetic care. (Target Date: 11/16/2014)   Time 8   Period Weeks   Status On-going   PT LONG TERM GOAL #2   Title Tolerates wear of prosthesis >90% of awake hours without skin issues or discomfort. (Target Date: 11/16/2014)   Time 8   Period Weeks   Status On-going   PT LONG TERM GOAL #3   Title Berg Balance >45/56 (Target Date: 11/16/2014)   Time 8   Period Weeks   Status On-going   PT LONG TERM GOAL #4   Title ambulates around household furniture carrying objects without device except prosthesis independently. (Target Date: 11/16/2014)   Time 8   Period Weeks   Status On-going   PT LONG TERM GOAL #5   Title ambulates 1000' on various surfaces with single point cane & prosthesis modified independent. (Target Date: 11/16/2014)   Time 8   Period Weeks   Status On-going   PT LONG TERM GOAL #6   Title negotiates barriers including ramp, curb & stairs with single point cane & prosthesis modified independent. (Target Date: 11/16/2014)   Time 8   Period Weeks   Status On-going   PT LONG TERM GOAL #7   Title demonstrates proper technique to safely lift, carry & climb A-frame ladders. (Target Date: 11/16/2014)   Time 8   Period Weeks   Status On-going                Plan - 10/02/14 1445    Clinical Impression Statement Patient appears safe with RW including ramps & curbs. Patient tolerated increased wt with use of single point cane without tenderness on residual limb.    Pt will benefit from skilled therapeutic intervention in order to improve on the following deficits Abnormal gait;Decreased activity tolerance;Decreased balance;Decreased endurance;Decreased knowledge of use of DME;Decreased mobility;Decreased strength;Postural dysfunction;Other (comment)  prosthetic dependency   Rehab Potential Good   PT Frequency 2x /  week   PT Duration 8 weeks   PT Treatment/Interventions ADLs/Self Care Home Management;DME Instruction;Gait training;Stair training;Functional mobility training;Therapeutic activities;Therapeutic exercise;Balance training;Neuromuscular re-education;Patient/family education;Other (comment)  prosthetic training   PT Next Visit Plan review prosthetic care, gait with single point cane including barriers (try curb and ramp)   Consulted and Agree with Plan of Care Patient        Problem List Patient Active Problem List   Diagnosis Date Noted  . CKD stage 4 due to type 2 diabetes mellitus 08/22/2014  . S/P BKA (below knee amputation) unilateral 05/30/2014  . Complete amputation of right foot 04/23/2014  . End stage renal disease 04/11/2013  . Chronic hyperkalemia   . CKD (chronic kidney disease)   . Vitamin D deficiency   . Diabetes mellitus type 2 with complications   . Hypertension   . Prostate cancer   . Undescended testicle     Calise Dunckel PT, DPT 10/02/2014, 9:59 PM  [facility] [address] [place], [place], [address] Phone: [phone]   Fax:  [phone]

## 2014-10-02 NOTE — Telephone Encounter (Signed)
Medication refilled per protocol. 

## 2014-10-04 ENCOUNTER — Ambulatory Visit: Payer: Medicare PPO | Admitting: Physical Therapy

## 2014-10-04 ENCOUNTER — Encounter: Payer: Self-pay | Admitting: Physical Therapy

## 2014-10-04 DIAGNOSIS — R6889 Other general symptoms and signs: Secondary | ICD-10-CM

## 2014-10-04 DIAGNOSIS — R269 Unspecified abnormalities of gait and mobility: Secondary | ICD-10-CM | POA: Diagnosis not present

## 2014-10-04 DIAGNOSIS — R531 Weakness: Secondary | ICD-10-CM

## 2014-10-05 NOTE — Therapy (Signed)
[name] [address] [place], [place], [address] Phone: [phone]   Fax:  [phone]  Physical Therapy Treatment  Patient Details  Name: Phillip Herring MRN: [id number] Date of Birth: November 01, 1940 Referring Provider:  Orlena Sheldon, PA-C  Encounter Date: 10/04/2014      PT End of Session - 10/04/14 1321    Visit Number 5   Number of Visits 17   Date for PT Re-Evaluation 11/18/14   Authorization Type G-code every 10th visit. [employer]   PT Start Time 1319   PT Stop Time 1400   PT Time Calculation (min) 41 min   Equipment Utilized During Treatment Gait belt   Activity Tolerance Patient tolerated treatment well   Behavior During Therapy WFL for tasks assessed/performed      Past Medical History  Diagnosis Date  . Hypertension   . Undescended testicle   . Vitamin D deficiency   . Prostate cancer   . Chronic hyperkalemia 12/23/2012    Sees Renal  . NIDDM (non-insulin dependent diabetes mellitus)   . CKD (chronic kidney disease), stage IV     Past Surgical History  Procedure Laterality Date  . Foot amputation Right 2010    cut off 1/2 my foot"  . Refractive surgery Bilateral 2000's  . Av fistula placement Left 04/17/2013    Procedure: ARTERIOVENOUS (AV) FISTULA CREATION- LEFT RADIOCEPHALIC;  Surgeon: Rosetta Posner, MD;  Location: [facility];  Service: Vascular;  Laterality: Left;  . Shuntogram N/A 08/03/2013    Procedure: Earney Mallet;  Surgeon: Conrad [place], MD;  Location: [facility] CATH LAB;  Service: Cardiovascular;  Laterality: N/A;  . Colonoscopy    . Foot amputation Right 05/30/2014    transtibial  . Amputation Right 05/30/2014    Procedure: AMPUTATION BELOW KNEE;  Surgeon: Newt Minion, MD;  Location: [facility];  Service: Orthopedics;  Laterality: Right;    There were no vitals filed for this visit.  Visit Diagnosis:  Abnormality of gait  Decreased functional activity tolerance  Weakness generalized      Subjective  Assessment - 10/04/14 1320    Subjective No new complaints. No falls or pain to report.    Currently in Pain? No/denies           [facility] Adult PT Treatment/Exercise - 10/04/14 1321    Transfers   Sit to Stand 5: Supervision;With upper extremity assist;With armrests;From chair/3-in-1   Stand to Sit 5: Supervision;With upper extremity assist;With armrests;To chair/3-in-1   Ambulation/Gait   Ambulation/Gait Yes   Ambulation/Gait Assistance 4: Min assist   Ambulation/Gait Assistance Details cues on posture, for right knee exension with stance, equal step length and for sequence with gait                        Ambulation Distance (Feet) 110 Feet  x2 reps   Assistive device Straight cane;Prosthesis   Gait Pattern Decreased stride length;Decreased stance time - right;Decreased step length - left;Right flexed knee in stance;Right steppage;Trunk flexed;Narrow base of support   Ramp 4: Min assist  with cane/prosthesis   Ramp Details (indicate cue type and reason) cues on sequence, step length and posture   Curb 4: Min assist  with cane/prosthesis   Curb Details (indicate cue type and reason) cues on sequence and technique   High Level Balance   High Level Balance Activities Tandem walking;Marching forwards;Marching backwards;Side stepping  tandem forward/backwards   High Level Balance Comments in parallel  bars: x 3 laps each with min guard assist   Prosthetics   Prosthetic Care Comments  Saw prosthetist Tuesday, no adjustments made. Pt noted to have longer leg length on prosthetic side (1/4 inch longer on prosthetic side). Added heel wedge to left shoe with leg length correction achieved. Wedge left in shoe and pt instructed to remove it if he noted hip/back/knee pain that is new/different. Pt verbalized understanding. If leg length descrepancy still present next visit will send to prosthetist for adjustment.                                  Current prosthetic wear tolerance (days/week)  daily    Current prosthetic wear tolerance (#hours/day)  3 hours 2x day   Residual limb condition  intact per pt.   Education Provided Residual limb care;Correct ply sock adjustment;Proper wear schedule/adjustment  getting on/off riding Psychologist, educational) Educated Patient   Education Method Explanation   Education Method Verbalized understanding;Verbal cues required   Donning Prosthesis Supervision   Doffing Prosthesis Supervision           PT Short Term Goals - 09/25/14 1646    PT SHORT TERM GOAL #1   Title Patient verbalizes basic understanding of prosthetic care with PT cues for adjusting ply socks & problem solving issues. (Target Date: 10/19/2014)   Time 4   Period Weeks   Status On-going   PT SHORT TERM GOAL #2   Title tolerates wear >8 hours /day daily with no skin issues nor undue discomfort. (Target Date: 10/19/2014)   Time 4   Period Weeks   Status On-going   PT SHORT TERM GOAL #3   Title reaches 10" & to floor without UE support with supervision. (Target Date: 10/19/2014)   Time 4   Period Weeks   Status On-going   PT SHORT TERM GOAL #4   Title ambulates 500' with LRAD & prosthesis with supervision / cues. (Target Date: 10/19/2014)   Time 4   Period Weeks   Status On-going   PT SHORT TERM GOAL #5   Title negotiates ramp, curb and stairs with LRAD & prosthesis with supervision. (Target Date: 10/19/2014)   Time 4   Period Weeks   Status On-going           PT Long Term Goals - 09/25/14 1647    PT LONG TERM GOAL #1   Title independent in prosthetic care. (Target Date: 11/16/2014)   Time 8   Period Weeks   Status On-going   PT LONG TERM GOAL #2   Title Tolerates wear of prosthesis >90% of awake hours without skin issues or discomfort. (Target Date: 11/16/2014)   Time 8   Period Weeks   Status On-going   PT LONG TERM GOAL #3   Title Berg Balance >45/56 (Target Date: 11/16/2014)   Time 8   Period Weeks   Status On-going   PT LONG TERM GOAL #4   Title  ambulates around household furniture carrying objects without device except prosthesis independently. (Target Date: 11/16/2014)   Time 8   Period Weeks   Status On-going   PT LONG TERM GOAL #5   Title ambulates 1000' on various surfaces with single point cane & prosthesis modified independent. (Target Date: 11/16/2014)   Time 8   Period Weeks   Status On-going   PT LONG TERM GOAL #6   Title negotiates barriers including  ramp, curb & stairs with single point cane & prosthesis modified independent. (Target Date: 11/16/2014)   Time 8   Period Weeks   Status On-going   PT LONG TERM GOAL #7   Title demonstrates proper technique to safely lift, carry & climb A-frame ladders. (Target Date: 11/16/2014)   Time 8   Period Weeks   Status On-going            Plan - 10/04/14 1321    Clinical Impression Statement Pt appears to be longer on his prosthetic side and reported feeling better/more balanced when heel lift added to other shoe. Pt making steady progress toward goals.   Pt will benefit from skilled therapeutic intervention in order to improve on the following deficits Abnormal gait;Decreased activity tolerance;Decreased balance;Decreased endurance;Decreased knowledge of use of DME;Decreased mobility;Decreased strength;Postural dysfunction;Other (comment)  prosthetic dependency   Rehab Potential Good   PT Frequency 2x / week   PT Duration 8 weeks   PT Treatment/Interventions ADLs/Self Care Home Management;DME Instruction;Gait training;Stair training;Functional mobility training;Therapeutic activities;Therapeutic exercise;Balance training;Neuromuscular re-education;Patient/family education;Other (comment)  prosthetic training   PT Next Visit Plan review prosthetic care, gait with single point cane, balance activities.   Consulted and Agree with Plan of Care Patient       Problem List Patient Active Problem List   Diagnosis Date Noted  . CKD stage 4 due to type 2 diabetes mellitus  08/22/2014  . S/P BKA (below knee amputation) unilateral 05/30/2014  . Complete amputation of right foot 04/23/2014  . End stage renal disease 04/11/2013  . Chronic hyperkalemia   . CKD (chronic kidney disease)   . Vitamin D deficiency   . Diabetes mellitus type 2 with complications   . Hypertension   . Prostate cancer   . Undescended testicle     Willow Ora 10/05/2014, 12:06 PM  Willow Ora, PTA, [facility] [address] [place], [place] [address] [phone] 10/05/2014, 12:06 PM

## 2014-10-09 ENCOUNTER — Encounter: Payer: Medicare PPO | Admitting: Physical Therapy

## 2014-10-11 ENCOUNTER — Ambulatory Visit: Payer: Medicare PPO | Admitting: Physical Therapy

## 2014-10-11 ENCOUNTER — Encounter: Payer: Self-pay | Admitting: Physical Therapy

## 2014-10-11 DIAGNOSIS — R269 Unspecified abnormalities of gait and mobility: Secondary | ICD-10-CM

## 2014-10-11 DIAGNOSIS — R6889 Other general symptoms and signs: Secondary | ICD-10-CM

## 2014-10-11 NOTE — Therapy (Signed)
[facility] [address] [place], [place], [address] Phone: [phone]   Fax:  [phone]  Physical Therapy Treatment  Patient Details  Name: Phillip Herring MRN: [id number] Date of Birth: 02/06/1941 Referring Provider:  Orlena Sheldon, PA-C  Encounter Date: 10/11/2014      PT End of Session - 10/11/14 1321    Visit Number 6   Number of Visits 17   Date for PT Re-Evaluation 11/18/14   Authorization Type G-code every 10th visit. [employer]   PT Start Time 1219   PT Stop Time 1400   PT Time Calculation (min) 43 min   Equipment Utilized During Treatment Gait belt   Activity Tolerance Patient tolerated treatment well   Behavior During Therapy WFL for tasks assessed/performed      Past Medical History  Diagnosis Date  . Hypertension   . Undescended testicle   . Vitamin D deficiency   . Prostate cancer   . Chronic hyperkalemia 12/23/2012    Sees Renal  . NIDDM (non-insulin dependent diabetes mellitus)   . CKD (chronic kidney disease), stage IV     Past Surgical History  Procedure Laterality Date  . Foot amputation Right 2010    cut off 1/2 my foot"  . Refractive surgery Bilateral 2000's  . Av fistula placement Left 04/17/2013    Procedure: ARTERIOVENOUS (AV) FISTULA CREATION- LEFT RADIOCEPHALIC;  Surgeon: Rosetta Posner, MD;  Location: [place];  Service: Vascular;  Laterality: Left;  . Shuntogram N/A 08/03/2013    Procedure: Earney Mallet;  Surgeon: Conrad Evergreen, MD;  Location: [facility] CATH LAB;  Service: Cardiovascular;  Laterality: N/A;  . Colonoscopy    . Foot amputation Right 05/30/2014    transtibial  . Amputation Right 05/30/2014    Procedure: AMPUTATION BELOW KNEE;  Surgeon: Newt Minion, MD;  Location: [place];  Service: Orthopedics;  Laterality: Right;    There were no vitals filed for this visit.  Visit Diagnosis:  Abnormality of gait  Decreased functional activity tolerance      Subjective Assessment - 10/11/14  1319    Subjective No falls or pain to report. Saw eye doctor on Tuesday, still having issues with left eye swelling. MD unsure what is causing it, see's him again in 3 weeks.   Currently in Pain? No/denies           [facility] Adult PT Treatment/Exercise - 10/11/14 1321    Transfers   Sit to Stand 5: Supervision;With upper extremity assist;With armrests;From chair/3-in-1   Stand to Sit 5: Supervision;With upper extremity assist;With armrests;To chair/3-in-1   Ambulation/Gait   Ambulation/Gait Yes   Ambulation/Gait Assistance 4: Min guard;4: Min assist   Ambulation/Gait Assistance Details cues on posture, cane position, increased left step length with gait                      Ambulation Distance (Feet) 115 Feet  x3 (3rd rep included barriers)   Assistive device Straight cane;Prosthesis   Gait Pattern Decreased stride length;Decreased stance time - right;Decreased step length - left;Right flexed knee in stance;Right steppage;Trunk flexed;Narrow base of support   Ambulation Surface Level;Indoor   Stairs Yes   Stairs Assistance 4: Min guard   Stairs Assistance Details (indicate cue type and reason) cues on sequence with use of 1 rail/cane for ascending/descending stairs.   Stair Management Technique One rail Right;One rail Left;Step to pattern;Forwards;With cane   Number of Stairs 4  x 2 reps  Ramp 4: Min assist  with cane/prosthesis   Ramp Details (indicate cue type and reason) cues on sequence and technique   Curb 4: Min assist  with cane/prosthesis   Curb Details (indicate cue type and reason) cues on sequence and posture   Dynamic Standing Balance   Dynamic Standing - Balance Support No upper extremity supported;During functional activity   Dynamic Standing - Level of Assistance 4: Min assist   Dynamic Standing - Balance Activities Rocker board;Eyes open;Eyes closed;Head turns;Head nods   Dynamic Standing - Comments performed both ways on board: eyes closed no head movements, eyes  open head nods/shakes with intermitted touching to bars for balance. cues on posture and weight shifiting for midline position/improved balance.   High Level Balance   High Level Balance Activities Marching forwards;Marching backwards;Side stepping   High Level Balance Comments at counter top x 3 laps each with 1-2 UE support for balance and min guard to min assist for balance, cues on posture and ex form.                  Prosthetics   Prosthetic Care Comments  Pt to increase wear to 4 hours 2x day. Discuessed drying midway with each wear and between each wear cycle.                             Current prosthetic wear tolerance (days/week)  daily   Current prosthetic wear tolerance (#hours/day)  3 hours 2x day   Residual limb condition  intact per pt.   Education Provided Residual limb care;Correct ply sock adjustment;Proper wear schedule/adjustment   Person(s) Educated Patient   Education Method Explanation;Tactile cues   Education Method Verbalized understanding;Verbal cues required   Donning Prosthesis Supervision   Doffing Prosthesis Supervision           PT Short Term Goals - 09/25/14 1646    PT SHORT TERM GOAL #1   Title Patient verbalizes basic understanding of prosthetic care with PT cues for adjusting ply socks & problem solving issues. (Target Date: 10/19/2014)   Time 4   Period Weeks   Status On-going   PT SHORT TERM GOAL #2   Title tolerates wear >8 hours /day daily with no skin issues nor undue discomfort. (Target Date: 10/19/2014)   Time 4   Period Weeks   Status On-going   PT SHORT TERM GOAL #3   Title reaches 10" & to floor without UE support with supervision. (Target Date: 10/19/2014)   Time 4   Period Weeks   Status On-going   PT SHORT TERM GOAL #4   Title ambulates 500' with LRAD & prosthesis with supervision / cues. (Target Date: 10/19/2014)   Time 4   Period Weeks   Status On-going   PT SHORT TERM GOAL #5   Title negotiates ramp, curb and stairs with LRAD  & prosthesis with supervision. (Target Date: 10/19/2014)   Time 4   Period Weeks   Status On-going           PT Long Term Goals - 09/25/14 1647    PT LONG TERM GOAL #1   Title independent in prosthetic care. (Target Date: 11/16/2014)   Time 8   Period Weeks   Status On-going   PT LONG TERM GOAL #2   Title Tolerates wear of prosthesis >90% of awake hours without skin issues or discomfort. (Target Date: 11/16/2014)   Time 8   Period Weeks  Status On-going   PT LONG TERM GOAL #3   Title Berg Balance >45/56 (Target Date: 11/16/2014)   Time 8   Period Weeks   Status On-going   PT LONG TERM GOAL #4   Title ambulates around household furniture carrying objects without device except prosthesis independently. (Target Date: 11/16/2014)   Time 8   Period Weeks   Status On-going   PT LONG TERM GOAL #5   Title ambulates 1000' on various surfaces with single point cane & prosthesis modified independent. (Target Date: 11/16/2014)   Time 8   Period Weeks   Status On-going   PT LONG TERM GOAL #6   Title negotiates barriers including ramp, curb & stairs with single point cane & prosthesis modified independent. (Target Date: 11/16/2014)   Time 8   Period Weeks   Status On-going   PT LONG TERM GOAL #7   Title demonstrates proper technique to safely lift, carry & climb A-frame ladders. (Target Date: 11/16/2014)   Time 8   Period Weeks   Status On-going           Plan - 10/11/14 1321    Clinical Impression Statement Pt still has heel lift in shoe opposite the prosthesis, finds it is helping and feels more level with it. Seeing the prosthetist to get his new shoes and will have his prosthesis adjusted at this time.  Pt continues to make steady progress toward goals.                                                                 Pt will benefit from skilled therapeutic intervention in order to improve on the following deficits Abnormal gait;Decreased activity tolerance;Decreased  balance;Decreased endurance;Decreased knowledge of use of DME;Decreased mobility;Decreased strength;Postural dysfunction;Other (comment)  prosthetic dependency   Rehab Potential Good   PT Frequency 2x / week   PT Duration 8 weeks   PT Treatment/Interventions ADLs/Self Care Home Management;DME Instruction;Gait training;Stair training;Functional mobility training;Therapeutic activities;Therapeutic exercise;Balance training;Neuromuscular re-education;Patient/family education;Other (comment)  prosthetic training   PT Next Visit Plan review prosthetic care, gait with single point cane, balance activities.   Consulted and Agree with Plan of Care Patient      Problem List Patient Active Problem List   Diagnosis Date Noted  . CKD stage 4 due to type 2 diabetes mellitus 08/22/2014  . S/P BKA (below knee amputation) unilateral 05/30/2014  . Complete amputation of right foot 04/23/2014  . End stage renal disease 04/11/2013  . Chronic hyperkalemia   . CKD (chronic kidney disease)   . Vitamin D deficiency   . Diabetes mellitus type 2 with complications   . Hypertension   . Prostate cancer   . Undescended testicle     Willow Ora 10/11/2014, 7:22 PM  Willow Ora, PTA, [place] [address], [place],  [address] [phone] 10/11/2014, 7:22 PM

## 2014-10-16 ENCOUNTER — Ambulatory Visit: Payer: Medicare PPO | Admitting: Physical Therapy

## 2014-10-16 ENCOUNTER — Encounter: Payer: Self-pay | Admitting: Physical Therapy

## 2014-10-16 DIAGNOSIS — Z89511 Acquired absence of right leg below knee: Secondary | ICD-10-CM

## 2014-10-16 DIAGNOSIS — R6889 Other general symptoms and signs: Secondary | ICD-10-CM

## 2014-10-16 DIAGNOSIS — R269 Unspecified abnormalities of gait and mobility: Secondary | ICD-10-CM | POA: Diagnosis not present

## 2014-10-16 DIAGNOSIS — R531 Weakness: Secondary | ICD-10-CM

## 2014-10-16 DIAGNOSIS — R2689 Other abnormalities of gait and mobility: Secondary | ICD-10-CM

## 2014-10-16 NOTE — Therapy (Signed)
[facility] [address] [place], [place], [address] Phone: [phone]   Fax:  [phone]  Physical Therapy Treatment  Patient Details  Name: Phillip Herring MRN: [id number] Date of Birth: 27-Nov-1940 Referring Provider:  Orlena Sheldon, PA-C  Encounter Date: 10/16/2014      PT End of Session - 10/16/14 1058    Visit Number 7   Number of Visits 17   Date for PT Re-Evaluation 11/18/14   Authorization Type G-code every 10th visit. [employer]   PT Start Time 1058   PT Stop Time 1147   PT Time Calculation (min) 49 min   Equipment Utilized During Treatment Gait belt   Activity Tolerance Patient tolerated treatment well   Behavior During Therapy WFL for tasks assessed/performed      Past Medical History  Diagnosis Date  . Hypertension   . Undescended testicle   . Vitamin D deficiency   . Prostate cancer   . Chronic hyperkalemia 12/23/2012    Sees Renal  . NIDDM (non-insulin dependent diabetes mellitus)   . CKD (chronic kidney disease), stage IV     Past Surgical History  Procedure Laterality Date  . Foot amputation Right 2010    cut off 1/2 my foot"  . Refractive surgery Bilateral 2000's  . Av fistula placement Left 04/17/2013    Procedure: ARTERIOVENOUS (AV) FISTULA CREATION- LEFT RADIOCEPHALIC;  Surgeon: Rosetta Posner, MD;  Location: [place];  Service: Vascular;  Laterality: Left;  . Shuntogram N/A 08/03/2013    Procedure: Earney Mallet;  Surgeon: Conrad [place], MD;  Location: [facility] CATH LAB;  Service: Cardiovascular;  Laterality: N/A;  . Colonoscopy    . Foot amputation Right 05/30/2014    transtibial  . Amputation Right 05/30/2014    Procedure: AMPUTATION BELOW KNEE;  Surgeon: Newt Minion, MD;  Location: [place];  Service: Orthopedics;  Laterality: Right;    There were no vitals filed for this visit.  Visit Diagnosis:  Abnormality of gait  Decreased functional activity tolerance  Weakness generalized  Balance  problems  Status post below knee amputation of right lower extremity      Subjective Assessment - 10/16/14 1151    Subjective Pt states he is wearing the prosthesis 4 hours twice a day, no complaints or falls. States he used both hand rails to walk down his 25' ramp this morning in order to come to PT session wthout any issues.    Currently in Pain? No/denies                         [facility] Adult PT Treatment/Exercise - 10/16/14 0001    Transfers   Sit to Stand 5: Supervision;With upper extremity assist;With armrests;From chair/3-in-1   Stand to Sit 5: Supervision;With upper extremity assist;With armrests;To chair/3-in-1   Ambulation/Gait   Ambulation/Gait Yes   Ambulation/Gait Assistance 4: Min guard;5: Supervision   Ambulation/Gait Assistance Details cues for increased step length on L and decreased ABD on R   Ambulation Distance (Feet) 230 Feet  x1 and 150x1 with SPC   Assistive device Straight cane;Prosthesis   Gait Pattern Step-through pattern;Decreased step length - left;Abducted- right   Ambulation Surface Level;Indoor   Stairs No   Ramp 4: Min assist  min guard, no assistance but used for safety   Ramp Details (indicate cue type and reason) cues for step length on the L while ascending stairs and maintaining weight on heels to descend  Curb 4: Min assist  SPC and prosthesis   Curb Details (indicate cue type and reason) cues for sequencing and foot placement to maintain balance and momentum   High Level Balance   High Level Balance Activities --  Golfing   High Level Balance Comments putting x10 without AD, chipping x5 without AD                PT Education - 10/16/14 1058    Education provided Yes   Education Details Instructed pt to increase wear to 5hrs 2x/day and to begin wearing the prosthesis immediately upon waking. Explained the signs of sweating and proper time to dry the residual limb and liner. Advised the patient to begin using the  [facility] inside the home for short distances and ocntinue with the RW for community ambulation. Proper stepping pattern to ascend and descend ramp and curb to maintain momentum.    Person(s) Educated Patient   Methods Explanation;Demonstration;Verbal cues   Comprehension Verbalized understanding;Returned demonstration;Verbal cues required;Need further instruction          PT Short Term Goals - 10/16/14 1416    PT SHORT TERM GOAL #1   Title Patient verbalizes basic understanding of prosthetic care with PT cues for adjusting ply socks & problem solving issues.    Baseline MET 10/16/14   Time 4   Period Weeks   Status Achieved   PT SHORT TERM GOAL #2   Title tolerates wear >8 hours /day daily with no skin issues nor undue discomfort.    Baseline MET 10/16/14   Time 4   Period Weeks   Status Achieved   PT SHORT TERM GOAL #3   Title reaches 10" & to floor without UE support with supervision. (NEW Target Date: 11/13/14)   Baseline Can reach 10" and to floor to retrieve object but requires UE support on [facility]   Time 4   Period Weeks   Status On-going   PT SHORT TERM GOAL #4   Title ambulates 500' with LRAD & prosthesis with supervision / cues. (Target Date: 10/19/2014)   Time 4   Period Weeks   Status On-going   PT SHORT TERM GOAL #5   Title negotiates ramp, curb and stairs with LRAD & prosthesis with supervision. (Target Date: 10/19/2014)   Time 4   Period Weeks   Status On-going           PT Long Term Goals - 09/25/14 1647    PT LONG TERM GOAL #1   Title independent in prosthetic care. (Target Date: 11/16/2014)   Time 8   Period Weeks   Status On-going   PT LONG TERM GOAL #2   Title Tolerates wear of prosthesis >90% of awake hours without skin issues or discomfort. (Target Date: 11/16/2014)   Time 8   Period Weeks   Status On-going   PT LONG TERM GOAL #3   Title Berg Balance >45/56 (Target Date: 11/16/2014)   Time 8   Period Weeks   Status On-going   PT LONG TERM GOAL #4    Title ambulates around household furniture carrying objects without device except prosthesis independently. (Target Date: 11/16/2014)   Time 8   Period Weeks   Status On-going   PT LONG TERM GOAL #5   Title ambulates 1000' on various surfaces with single point cane & prosthesis modified independent. (Target Date: 11/16/2014)   Time 8   Period Weeks   Status On-going   PT LONG TERM GOAL #6  Title negotiates barriers including ramp, curb & stairs with single point cane & prosthesis modified independent. (Target Date: 11/16/2014)   Time 8   Period Weeks   Status On-going   PT LONG TERM GOAL #7   Title demonstrates proper technique to safely lift, carry & climb A-frame ladders. (Target Date: 11/16/2014)   Time 8   Period Weeks   Status On-going               Plan - 10/16/14 1058    Clinical Impression Statement Pt is continuing to improve with balance and activity endurance. Requires verbal cueing to regotiate ramps and curbs.    Pt will benefit from skilled therapeutic intervention in order to improve on the following deficits Abnormal gait;Decreased activity tolerance;Decreased balance;Decreased endurance;Decreased knowledge of use of DME;Decreased mobility;Decreased strength;Postural dysfunction;Other (comment)  prosthetic dependency   Rehab Potential Good   PT Frequency 2x / week   PT Duration 8 weeks   PT Treatment/Interventions ADLs/Self Care Home Management;DME Instruction;Gait training;Stair training;Functional mobility training;Therapeutic activities;Therapeutic exercise;Balance training;Neuromuscular re-education;Patient/family education;Other (comment)  prosthetic training   PT Next Visit Plan gait with single point cane, balance activities, discuss driving technique, assess remaning STGs.   Consulted and Agree with Plan of Care Patient        Problem List Patient Active Problem List   Diagnosis Date Noted  . CKD stage 4 due to type 2 diabetes mellitus 08/22/2014   . S/P BKA (below knee amputation) unilateral 05/30/2014  . Complete amputation of right foot 04/23/2014  . End stage renal disease 04/11/2013  . Chronic hyperkalemia   . CKD (chronic kidney disease)   . Vitamin D deficiency   . Diabetes mellitus type 2 with complications   . Hypertension   . Prostate cancer   . Undescended testicle    Jamey Reas, PT, DPT PT Specializing in [place] 10/16/2014 5:14 PM Phone:  [phone]  Fax:  [phone] [place] [address] [place], [place] [address]  Denica Web Blair Kyelle Urbas, [place] 10/16/2014, 5:13 PM  [facility] [address] [place], [place], [address] Phone: [phone]   Fax:  [phone]

## 2014-10-18 ENCOUNTER — Encounter: Payer: Self-pay | Admitting: Physical Therapy

## 2014-10-18 ENCOUNTER — Ambulatory Visit: Payer: Medicare PPO | Admitting: Physical Therapy

## 2014-10-18 DIAGNOSIS — R269 Unspecified abnormalities of gait and mobility: Secondary | ICD-10-CM

## 2014-10-18 DIAGNOSIS — R531 Weakness: Secondary | ICD-10-CM

## 2014-10-18 DIAGNOSIS — R6889 Other general symptoms and signs: Secondary | ICD-10-CM

## 2014-10-18 NOTE — Therapy (Signed)
[facility] [address] [place], [place], [address] Phone: [phone]   Fax:  [phone]  Physical Therapy Treatment  Patient Details  Name: Phillip Herring MRN: [id number] Date of Birth: [birth year] Referring Provider:  Orlena Sheldon, PA-C  Encounter Date: 10/18/2014      PT End of Session - 10/18/14 1321    Visit Number 8   Number of Visits 17   Date for PT Re-Evaluation 11/18/14   Authorization Type G-code every 10th visit. [employer]   PT Start Time 1318   PT Stop Time 1400   PT Time Calculation (min) 42 min   Equipment Utilized During Treatment Gait belt   Activity Tolerance Patient tolerated treatment well   Behavior During Therapy WFL for tasks assessed/performed      Past Medical History  Diagnosis Date  . Hypertension   . Undescended testicle   . Vitamin D deficiency   . Prostate cancer   . Chronic hyperkalemia 12/23/2012    Sees Renal  . NIDDM (non-insulin dependent diabetes mellitus)   . CKD (chronic kidney disease), stage IV     Past Surgical History  Procedure Laterality Date  . Foot amputation Right 2010    cut off 1/2 my foot"  . Refractive surgery Bilateral 2000's  . Av fistula placement Left 04/17/2013    Procedure: ARTERIOVENOUS (AV) FISTULA CREATION- LEFT RADIOCEPHALIC;  Surgeon: Rosetta Posner, MD;  Location: [facility];  Service: Vascular;  Laterality: Left;  . Shuntogram N/A 08/03/2013    Procedure: Earney Mallet;  Surgeon: Conrad Angola, MD;  Location: [facility] CATH LAB;  Service: Cardiovascular;  Laterality: N/A;  . Colonoscopy    . Foot amputation Right 05/30/2014    transtibial  . Amputation Right 05/30/2014    Procedure: AMPUTATION BELOW KNEE;  Surgeon: Newt Minion, MD;  Location: [facility];  Service: Orthopedics;  Laterality: Right;    There were no vitals filed for this visit.  Visit Diagnosis:  Abnormality of gait  Decreased functional activity tolerance  Weakness generalized      Subjective  Assessment - 10/18/14 1320    Subjective No new complaints. No pain or falls to report.   Currently in Pain? No/denies            [facility] Adult PT Treatment/Exercise - 10/18/14 1322    Transfers   Sit to Stand 5: Supervision;With upper extremity assist;With armrests;From chair/3-in-1   Stand to Sit 5: Supervision;With upper extremity assist;With armrests;To chair/3-in-1   Ambulation/Gait   Ambulation/Gait Yes   Ambulation/Gait Assistance 5: Supervision;6: Modified independent (Device/Increase time)   Ambulation/Gait Assistance Details cues for posture with cane, sequence and for equal step length   Ambulation Distance (Feet) 500 Feet  x1 with walker; 230 x1, 120 x1 with cane   Assistive device Rolling walker;Prosthesis;Straight cane   Gait Pattern Step-through pattern;Decreased step length - left;Abducted- right   Ambulation Surface Level;Unlevel;Indoor;Outdoor;Paved   Stairs Yes   Stairs Assistance 5: Supervision   Stairs Assistance Details (indicate cue type and reason) no cues needed with use of rails;   Stair Management Technique Two rails;Step to pattern;Forwards   Number of Stairs 4   Ramp 6: Modified independent (Device);Other (comment)  with walker/prosthesis;min guard assist with cane   Curb 6: Modified independent (Device/increase time);5: Supervision  with walker/prosthesis; supervision with cane   Prosthetics   Prosthetic Care Comments  Pt still wearing left heel lift, pelvic position checked and still level. Will have him see prosthetist and  get height of prosthesis adjusted.                             Current prosthetic wear tolerance (days/week)  daily   Current prosthetic wear tolerance (#hours/day)  5 hours 2 x day   Residual limb condition  intact per pt.   Education Provided Residual limb care;Proper wear schedule/adjustment;Correct ply sock adjustment  correct donning of shrinker   Person(s) Educated Patient   Education Method Explanation   Education Method  Verbalized understanding   Donning Prosthesis Supervision   Doffing Prosthesis Supervision            PT Short Term Goals - 10/18/14 1321    PT SHORT TERM GOAL #1   Title Patient verbalizes basic understanding of prosthetic care with PT cues for adjusting ply socks & problem solving issues.    Baseline MET 10/16/14   Time 4   Period Weeks   Status Achieved   PT SHORT TERM GOAL #2   Title tolerates wear >8 hours /day daily with no skin issues nor undue discomfort.    Baseline MET 10/16/14   Time 4   Period Weeks   Status Achieved   PT SHORT TERM GOAL #3   Title reaches 10" & to floor without UE support with supervision. (NEW Target Date: 11/13/14)   Baseline Can reach 10" and to floor to retrieve object but requires UE support on [facility]   Time 4   Period Weeks   Status Not Met   PT SHORT TERM GOAL #4   Title ambulates 500' with LRAD & prosthesis with supervision / cues. (Target Date: 10/19/2014)   Baseline met on 10/18/14 with walker   Time 4   Period Weeks   Status Achieved   PT SHORT TERM GOAL #5   Title negotiates ramp, curb and stairs with LRAD & prosthesis with supervision. (Target Date: 10/19/2014)   Baseline met on 10/18/14 with walker and rails   Time 4   Period Weeks   Status Achieved           PT Long Term Goals - 09/25/14 1647    PT LONG TERM GOAL #1   Title independent in prosthetic care. (Target Date: 11/16/2014)   Time 8   Period Weeks   Status On-going   PT LONG TERM GOAL #2   Title Tolerates wear of prosthesis >90% of awake hours without skin issues or discomfort. (Target Date: 11/16/2014)   Time 8   Period Weeks   Status On-going   PT LONG TERM GOAL #3   Title Berg Balance >45/56 (Target Date: 11/16/2014)   Time 8   Period Weeks   Status On-going   PT LONG TERM GOAL #4   Title ambulates around household furniture carrying objects without device except prosthesis independently. (Target Date: 11/16/2014)   Time 8   Period Weeks   Status On-going    PT LONG TERM GOAL #5   Title ambulates 1000' on various surfaces with single point cane & prosthesis modified independent. (Target Date: 11/16/2014)   Time 8   Period Weeks   Status On-going   PT LONG TERM GOAL #6   Title negotiates barriers including ramp, curb & stairs with single point cane & prosthesis modified independent. (Target Date: 11/16/2014)   Time 8   Period Weeks   Status On-going   PT LONG TERM GOAL #7   Title demonstrates proper technique to safely lift,  carry & climb A-frame ladders. (Target Date: 11/16/2014)   Time 8   Period Weeks   Status On-going            Plan - 10/18/14 1321    Clinical Impression Statement Pt met remaining STG's today. Progressing toward LTG's.    Pt will benefit from skilled therapeutic intervention in order to improve on the following deficits Abnormal gait;Decreased activity tolerance;Decreased balance;Decreased endurance;Decreased knowledge of use of DME;Decreased mobility;Decreased strength;Postural dysfunction;Other (comment)  prosthetic dependency   Rehab Potential Good   PT Frequency 2x / week   PT Duration 8 weeks   PT Treatment/Interventions ADLs/Self Care Home Management;DME Instruction;Gait training;Stair training;Functional mobility training;Therapeutic activities;Therapeutic exercise;Balance training;Neuromuscular re-education;Patient/family education;Other (comment)  prosthetic training   PT Next Visit Plan gait with single point cane, balance activities   Consulted and Agree with Plan of Care Patient        Problem List Patient Active Problem List   Diagnosis Date Noted  . CKD stage 4 due to type 2 diabetes mellitus 08/22/2014  . S/P BKA (below knee amputation) unilateral 05/30/2014  . Complete amputation of right foot 04/23/2014  . End stage renal disease 04/11/2013  . Chronic hyperkalemia   . CKD (chronic kidney disease)   . Vitamin D deficiency   . Diabetes mellitus type 2 with complications   . Hypertension    . Prostate cancer   . Undescended testicle     Willow Ora 10/18/2014, 8:08 PM  Willow Ora, PTA, [facility] [address] [place], [place] [address] [phone] 10/18/2014, 8:08 PM

## 2014-10-23 ENCOUNTER — Encounter: Payer: Self-pay | Admitting: Physical Therapy

## 2014-10-23 ENCOUNTER — Ambulatory Visit: Payer: Medicare PPO | Admitting: Physical Therapy

## 2014-10-23 DIAGNOSIS — Z89511 Acquired absence of right leg below knee: Secondary | ICD-10-CM

## 2014-10-23 DIAGNOSIS — R6889 Other general symptoms and signs: Secondary | ICD-10-CM

## 2014-10-23 DIAGNOSIS — R269 Unspecified abnormalities of gait and mobility: Secondary | ICD-10-CM | POA: Diagnosis not present

## 2014-10-23 DIAGNOSIS — R531 Weakness: Secondary | ICD-10-CM

## 2014-10-23 DIAGNOSIS — R2689 Other abnormalities of gait and mobility: Secondary | ICD-10-CM

## 2014-10-23 NOTE — Therapy (Signed)
[facility] [address] [place], [place], [address] Phone: [phone]   Fax:  [phone]  Physical Therapy Treatment  Patient Details  Name: Phillip Herring MRN: [id number] Date of Birth: 09/06/1940 Referring Provider:  Dixon, Mary B, PA-C  Encounter Date: 10/23/2014      PT End of Session - 10/23/14 1400    Visit Number 9   Number of Visits 17   Date for PT Re-Evaluation 11/18/14   Authorization Type G-code every 10th visit. [employer]   PT Start Time 1400   PT Stop Time 1445   PT Time Calculation (min) 45 min   Equipment Utilized During Treatment Gait belt   Activity Tolerance Patient tolerated treatment well   Behavior During Therapy WFL for tasks assessed/performed      Past Medical History  Diagnosis Date  . Hypertension   . Undescended testicle   . Vitamin D deficiency   . Prostate cancer   . Chronic hyperkalemia 12/23/2012    Sees Renal  . NIDDM (non-insulin dependent diabetes mellitus)   . CKD (chronic kidney disease), stage IV     Past Surgical History  Procedure Laterality Date  . Foot amputation Right 2010    cut off 1/2 my foot"  . Refractive surgery Bilateral 2000's  . Av fistula placement Left 04/17/2013    Procedure: ARTERIOVENOUS (AV) FISTULA CREATION- LEFT RADIOCEPHALIC;  Surgeon: Todd F Early, MD;  Location: [facility];  Service: Vascular;  Laterality: Left;  . Shuntogram N/A 08/03/2013    Procedure: SHUNTOGRAM;  Surgeon: Brian L Chen, MD;  Location: [facility] CATH LAB;  Service: Cardiovascular;  Laterality: N/A;  . Colonoscopy    . Foot amputation Right 05/30/2014    transtibial  . Amputation Right 05/30/2014    Procedure: AMPUTATION BELOW KNEE;  Surgeon: Marcus Duda V, MD;  Location: [facility];  Service: Orthopedics;  Laterality: Right;    There were no vitals filed for this visit.  Visit Diagnosis:  Abnormality of gait  Decreased functional activity tolerance  Weakness generalized  Balance  problems  Status post below knee amputation of right lower extremity      Subjective Assessment - 10/23/14 1404    Subjective Wearing prosthesis 5 hrs 2x/day without issue.   Currently in Pain? No/denies                         [facility] Adult PT Treatment/Exercise - 10/23/14 1400    Transfers   Sit to Stand 5: Supervision;With upper extremity assist;From chair/3-in-1  chair without armrests using UEs   Stand to Sit 5: Supervision;With upper extremity assist;To chair/3-in-1  chair without armrests using UEs   Ambulation/Gait   Ambulation/Gait Yes   Ambulation/Gait Assistance 5: Supervision   Ambulation/Gait Assistance Details cues on posture, step width & step length   Ambulation Distance (Feet) 500 Feet  500' X 2   Assistive device Prosthesis;Straight cane   Gait Pattern Step-through pattern;Decreased step length - left;Abducted- right   Ambulation Surface Indoor;Level;Outdoor;Unlevel;Gravel;Paved;Grass   Stairs Yes   Stairs Assistance 5: Supervision   Stair Management Technique Step to pattern;Forwards;One rail Right;With cane   Number of Stairs 4  3 reps   Ramp 4: Min assist  cane & prosthesis   Ramp Details (indicate cue type and reason) cues on technique   Curb 4: Min assist  cane & prosthesis   Curb Details (indicate cue type and reason) cues on technique   Dynamic Standing Balance     Dynamic Standing - Balance Support During functional activity;No upper extremity supported   Dynamic Standing - Level of Assistance 5: Stand by assistance   Dynamic Standing - Balance Activities Compliant surfaces  hitting golf balls with driver standing on grass, picking up   Dynamic Standing - Comments limited follow thru due to balance   Prosthetics   Prosthetic Care Comments  --   Current prosthetic wear tolerance (days/week)  daily   Current prosthetic wear tolerance (#hours/day)  5 hours 2 x day  PT increased to all awake hours with drying q4-5 hrs   Current  prosthetic weight-bearing tolerance (hours/day)  5-8 minutes until fatigue   Residual limb condition  intact   Education Provided Residual limb care;Correct ply sock adjustment;Proper wear schedule/adjustment  use of oil knee proximally, too many, too few & correct ply    Person(s) Educated Patient   Education Method Explanation;Demonstration;Verbal cues;Tactile cues   Education Method Verbalized understanding;Returned demonstration;Tactile cues required;Verbal cues required;Needs further instruction   Donning Prosthesis Modified independent (device/increased time)   Doffing Prosthesis Modified independent (device/increased time)     PT demo / instructed in floor transfer: Patient performed pushing on mat leading with RLE & with LLE           PT Education - 10/23/14 1400    Education provided Yes   Education Details Progress endurance with increasing frequency of short distance (household) gait & 1-3 times per day ambulating to tolerable distance. See prosthetic care.   Person(s) Educated Patient   Methods Explanation   Comprehension Verbalized understanding          PT Short Term Goals - 10/18/14 1321    PT SHORT TERM GOAL #1   Title Patient verbalizes basic understanding of prosthetic care with PT cues for adjusting ply socks & problem solving issues.    Baseline MET 10/16/14   Time 4   Period Weeks   Status Achieved   PT SHORT TERM GOAL #2   Title tolerates wear >8 hours /day daily with no skin issues nor undue discomfort.    Baseline MET 10/16/14   Time 4   Period Weeks   Status Achieved   PT SHORT TERM GOAL #3   Title reaches 10" & to floor without UE support with supervision. (NEW Target Date: 11/13/14)   Baseline Can reach 10" and to floor to retrieve object but requires UE support on [facility]   Time 4   Period Weeks   Status Not Met   PT SHORT TERM GOAL #4   Title ambulates 500' with LRAD & prosthesis with supervision / cues. (Target Date: 10/19/2014)   Baseline  met on 10/18/14 with walker   Time 4   Period Weeks   Status Achieved   PT SHORT TERM GOAL #5   Title negotiates ramp, curb and stairs with LRAD & prosthesis with supervision. (Target Date: 10/19/2014)   Baseline met on 10/18/14 with walker and rails   Time 4   Period Weeks   Status Achieved           PT Long Term Goals - 09/25/14 1647    PT LONG TERM GOAL #1   Title independent in prosthetic care. (Target Date: 11/16/2014)   Time 8   Period Weeks   Status On-going   PT LONG TERM GOAL #2   Title Tolerates wear of prosthesis >90% of awake hours without skin issues or discomfort. (Target Date: 11/16/2014)   Time 8   Period Weeks  Status On-going   PT LONG TERM GOAL #3   Title Berg Balance >45/56 (Target Date: 11/16/2014)   Time 8   Period Weeks   Status On-going   PT LONG TERM GOAL #4   Title ambulates around household furniture carrying objects without device except prosthesis independently. (Target Date: 11/16/2014)   Time 8   Period Weeks   Status On-going   PT LONG TERM GOAL #5   Title ambulates 1000' on various surfaces with single point cane & prosthesis modified independent. (Target Date: 11/16/2014)   Time 8   Period Weeks   Status On-going   PT LONG TERM GOAL #6   Title negotiates barriers including ramp, curb & stairs with single point cane & prosthesis modified independent. (Target Date: 11/16/2014)   Time 8   Period Weeks   Status On-going   PT LONG TERM GOAL #7   Title demonstrates proper technique to safely lift, carry & climb A-frame ladders. (Target Date: 11/16/2014)   Time 8   Period Weeks   Status On-going               Plan - 10/23/14 1100    Clinical Impression Statement Patient improved gait with single point cane including barriers. Fatigue with limited endurance appears to greatest issue limiting balance & gait.   Pt will benefit from skilled therapeutic intervention in order to improve on the following deficits Abnormal gait;Decreased  activity tolerance;Decreased balance;Decreased endurance;Decreased knowledge of use of DME;Decreased mobility;Decreased strength;Postural dysfunction;Other (comment)  prosthetic dependency   Rehab Potential Good   PT Frequency 2x / week   PT Duration 8 weeks   PT Treatment/Interventions ADLs/Self Care Home Management;DME Instruction;Gait training;Stair training;Functional mobility training;Therapeutic activities;Therapeutic exercise;Balance training;Neuromuscular re-education;Patient/family education;Other (comment)  prosthetic training   PT Next Visit Plan Do G-code, gait with single point cane, balance activities   Consulted and Agree with Plan of Care Patient        Problem List Patient Active Problem List   Diagnosis Date Noted  . CKD stage 4 due to type 2 diabetes mellitus 08/22/2014  . S/P BKA (below knee amputation) unilateral 05/30/2014  . Complete amputation of right foot 04/23/2014  . End stage renal disease 04/11/2013  . Chronic hyperkalemia   . CKD (chronic kidney disease)   . Vitamin D deficiency   . Diabetes mellitus type 2 with complications   . Hypertension   . Prostate cancer   . Undescended testicle     WALDRON,ROBIN PT, DPT 10/23/2014, 9:23 PM  [facility] [address] [place], [place], [address] Phone: [phone]   Fax:  [phone]      

## 2014-10-25 ENCOUNTER — Other Ambulatory Visit (HOSPITAL_COMMUNITY): Payer: Self-pay | Admitting: *Deleted

## 2014-10-25 ENCOUNTER — Ambulatory Visit: Payer: Medicare PPO | Attending: Orthopedic Surgery | Admitting: Physical Therapy

## 2014-10-25 ENCOUNTER — Encounter: Payer: Self-pay | Admitting: Physical Therapy

## 2014-10-25 DIAGNOSIS — R29818 Other symptoms and signs involving the nervous system: Secondary | ICD-10-CM | POA: Insufficient documentation

## 2014-10-25 DIAGNOSIS — R6889 Other general symptoms and signs: Secondary | ICD-10-CM | POA: Diagnosis present

## 2014-10-25 DIAGNOSIS — R269 Unspecified abnormalities of gait and mobility: Secondary | ICD-10-CM | POA: Insufficient documentation

## 2014-10-25 DIAGNOSIS — Z89511 Acquired absence of right leg below knee: Secondary | ICD-10-CM | POA: Insufficient documentation

## 2014-10-25 DIAGNOSIS — R531 Weakness: Secondary | ICD-10-CM | POA: Diagnosis present

## 2014-10-25 NOTE — Discharge Instructions (Signed)
Darbepoetin Alfa injection What is this medicine? DARBEPOETIN ALFA (dar be POE e tin AL fa) helps your body make more red blood cells. It is used to treat anemia caused by chronic kidney failure and chemotherapy. This medicine may be used for other purposes; ask your health care provider or pharmacist if you have questions. COMMON BRAND NAME(S): Aranesp What should I tell my health care provider before I take this medicine? They need to know if you have any of these conditions: -blood clotting disorders or history of blood clots -cancer patient not on chemotherapy -cystic fibrosis -heart disease, such as angina, heart failure, or a history of a heart attack -hemoglobin level of 12 g/dL or greater -high blood pressure -low levels of folate, iron, or vitamin B12 -seizures -an unusual or allergic reaction to darbepoetin, erythropoietin, albumin, hamster proteins, latex, other medicines, foods, dyes, or preservatives -pregnant or trying to get pregnant -breast-feeding How should I use this medicine? This medicine is for injection into a vein or under the skin. It is usually given by a health care professional in a hospital or clinic setting. If you get this medicine at home, you will be taught how to prepare and give this medicine. Do not shake the solution before you withdraw a dose. Use exactly as directed. Take your medicine at regular intervals. Do not take your medicine more often than directed. It is important that you put your used needles and syringes in a special sharps container. Do not put them in a trash can. If you do not have a sharps container, call your pharmacist or healthcare provider to get one. Talk to your pediatrician regarding the use of this medicine in children. While this medicine may be used in children as young as 1 year for selected conditions, precautions do apply. Overdosage: If you think you have taken too much of this medicine contact a poison control center or  emergency room at once. NOTE: This medicine is only for you. Do not share this medicine with others. What if I miss a dose? If you miss a dose, take it as soon as you can. If it is almost time for your next dose, take only that dose. Do not take double or extra doses. What may interact with this medicine? Do not take this medicine with any of the following medications: -epoetin alfa This list may not describe all possible interactions. Give your health care provider a list of all the medicines, herbs, non-prescription drugs, or dietary supplements you use. Also tell them if you smoke, drink alcohol, or use illegal drugs. Some items may interact with your medicine. What should I watch for while using this medicine? Visit your prescriber or health care professional for regular checks on your progress and for the needed blood tests and blood pressure measurements. It is especially important for the doctor to make sure your hemoglobin level is in the desired range, to limit the risk of potential side effects and to give you the best benefit. Keep all appointments for any recommended tests. Check your blood pressure as directed. Ask your doctor what your blood pressure should be and when you should contact him or her. As your body makes more red blood cells, you may need to take iron, folic acid, or vitamin B supplements. Ask your doctor or health care provider which products are right for you. If you have kidney disease continue dietary restrictions, even though this medication can make you feel better. Talk with your doctor or health   care professional about the foods you eat and the vitamins that you take. What side effects may I notice from receiving this medicine? Side effects that you should report to your doctor or health care professional as soon as possible: -allergic reactions like skin rash, itching or hives, swelling of the face, lips, or tongue -breathing problems -changes in vision -chest  pain -confusion, trouble speaking or understanding -feeling faint or lightheaded, falls -high blood pressure -muscle aches or pains -pain, swelling, warmth in the leg -rapid weight gain -severe headaches -sudden numbness or weakness of the face, arm or leg -trouble walking, dizziness, loss of balance or coordination -seizures (convulsions) -swelling of the ankles, feet, hands -unusually weak or tired Side effects that usually do not require medical attention (report to your doctor or health care professional if they continue or are bothersome): -diarrhea -fever, chills (flu-like symptoms) -headaches -nausea, vomiting -redness, stinging, or swelling at site where injected This list may not describe all possible side effects. Call your doctor for medical advice about side effects. You may report side effects to FDA at 1-800-FDA-1088. Where should I keep my medicine? Keep out of the reach of children. Store in a refrigerator between 2 and 8 degrees C (36 and 46 degrees F). Do not freeze. Do not shake. Throw away any unused portion if using a single-dose vial. Throw away any unused medicine after the expiration date. NOTE: This sheet is a summary. It may not cover all possible information. If you have questions about this medicine, talk to your doctor, pharmacist, or health care provider.  2015, [employer]. (2008-04-24 10:23:57)  

## 2014-10-25 NOTE — Therapy (Signed)
[facility] [address] [place], [place], [address] Phone: [phone]   Fax:  [phone]  Physical Therapy Treatment  Patient Details  Name: Phillip Herring MRN: [id number] Date of Birth: Jun 04, 1940 Referring Provider:  Orlena Sheldon, PA-C  Encounter Date: 10/25/2014      PT End of Session - 10/25/14 0852    Visit Number 10   Number of Visits 17   Date for PT Re-Evaluation 11/18/14   Authorization Type G-code every 10th visit. [employer]   PT Start Time [phone]   PT Stop Time 0930   PT Time Calculation (min) 40 min   Equipment Utilized During Treatment Gait belt   Activity Tolerance Patient tolerated treatment well   Behavior During Therapy WFL for tasks assessed/performed      Past Medical History  Diagnosis Date  . Hypertension   . Undescended testicle   . Vitamin D deficiency   . Prostate cancer   . Chronic hyperkalemia 12/23/2012    Sees Renal  . NIDDM (non-insulin dependent diabetes mellitus)   . CKD (chronic kidney disease), stage IV     Past Surgical History  Procedure Laterality Date  . Foot amputation Right 2010    cut off 1/2 my foot"  . Refractive surgery Bilateral 2000's  . Av fistula placement Left 04/17/2013    Procedure: ARTERIOVENOUS (AV) FISTULA CREATION- LEFT RADIOCEPHALIC;  Surgeon: Rosetta Posner, MD;  Location: [facility];  Service: Vascular;  Laterality: Left;  . Shuntogram N/A 08/03/2013    Procedure: Earney Mallet;  Surgeon: Conrad Garner, MD;  Location: [facility] CATH LAB;  Service: Cardiovascular;  Laterality: N/A;  . Colonoscopy    . Foot amputation Right 05/30/2014    transtibial  . Amputation Right 05/30/2014    Procedure: AMPUTATION BELOW KNEE;  Surgeon: Newt Minion, MD;  Location: [facility];  Service: Orthopedics;  Laterality: Right;    There were no vitals filed for this visit.  Visit Diagnosis:  Abnormality of gait  Decreased functional activity tolerance  Weakness generalized      Subjective  Assessment - 10/25/14 0851    Subjective No new complaints. No falls or pain to report.   Currently in Pain? No/denies           [facility] Adult PT Treatment/Exercise - 10/25/14 0854    Transfers   Sit to Stand 5: Supervision;With upper extremity assist;From chair/3-in-1   Stand to Sit 5: Supervision;With upper extremity assist;To chair/3-in-1   Ambulation/Gait   Ambulation/Gait Yes   Ambulation/Gait Assistance 5: Supervision   Ambulation/Gait Assistance Details cues on posture and to keep prosthesis under him with gait, not abducted out   Ambulation Distance (Feet) 220 Feet  x1, 500 x1   Assistive device Prosthesis;Straight cane   Gait Pattern Step-through pattern;Decreased step length - left;Abducted- right   Ambulation Surface Level;Indoor;Outdoor;Unlevel;Paved;Gravel;Grass   Stairs Yes   Stairs Assistance 5: Supervision   Stairs Assistance Details (indicate cue type and reason) cues on posture and hand placement with stair management   Stair Management Technique One rail Right;One rail Left;Step to pattern;Forwards;With cane   Number of Stairs 4  x 2 reps   Ramp Other (comment)  min guard assist with cane/prosthesis   Ramp Details (indicate cue type and reason) cues on step length and posture   Curb Other (comment)  min guard assist with cane/prosthesis   Curb Details (indicate cue type and reason) cues on proper sequence   Knee/Hip Exercises: Aerobic   Stationary  Bike Scifit x 4 extremities level 3.5 x 8 minutes with goal rpm >/=45 for strengthening and activity tolerance        Prosthetics   Current prosthetic wear tolerance (days/week)  daily   Current prosthetic wear tolerance (#hours/day)  all awake hours  drying every 3-4 hours and as needed   Residual limb condition  intact per pt report   Education Provided Proper wear schedule/adjustment;Correct ply sock adjustment   Person(s) Educated Patient   Education Method Explanation;Demonstration   Education Method  Verbalized understanding   Donning Prosthesis Modified independent (device/increased time)   Doffing Prosthesis Modified independent (device/increased time)           PT Short Term Goals - 10/18/14 1321    PT SHORT TERM GOAL #1   Title Patient verbalizes basic understanding of prosthetic care with PT cues for adjusting ply socks & problem solving issues.    Baseline MET 10/16/14   Time 4   Period Weeks   Status Achieved   PT SHORT TERM GOAL #2   Title tolerates wear >8 hours /day daily with no skin issues nor undue discomfort.    Baseline MET 10/16/14   Time 4   Period Weeks   Status Achieved   PT SHORT TERM GOAL #3   Title reaches 10" & to floor without UE support with supervision. (NEW Target Date: 11/13/14)   Baseline Can reach 10" and to floor to retrieve object but requires UE support on [facility]   Time 4   Period Weeks   Status Not Met   PT SHORT TERM GOAL #4   Title ambulates 500' with LRAD & prosthesis with supervision / cues. (Target Date: 10/19/2014)   Baseline met on 10/18/14 with walker   Time 4   Period Weeks   Status Achieved   PT SHORT TERM GOAL #5   Title negotiates ramp, curb and stairs with LRAD & prosthesis with supervision. (Target Date: 10/19/2014)   Baseline met on 10/18/14 with walker and rails   Time 4   Period Weeks   Status Achieved           PT Long Term Goals - 09/25/14 1647    PT LONG TERM GOAL #1   Title independent in prosthetic care. (Target Date: 11/16/2014)   Time 8   Period Weeks   Status On-going   PT LONG TERM GOAL #2   Title Tolerates wear of prosthesis >90% of awake hours without skin issues or discomfort. (Target Date: 11/16/2014)   Time 8   Period Weeks   Status On-going   PT LONG TERM GOAL #3   Title Berg Balance >45/56 (Target Date: 11/16/2014)   Time 8   Period Weeks   Status On-going   PT LONG TERM GOAL #4   Title ambulates around household furniture carrying objects without device except prosthesis independently. (Target  Date: 11/16/2014)   Time 8   Period Weeks   Status On-going   PT LONG TERM GOAL #5   Title ambulates 1000' on various surfaces with single point cane & prosthesis modified independent. (Target Date: 11/16/2014)   Time 8   Period Weeks   Status On-going   PT LONG TERM GOAL #6   Title negotiates barriers including ramp, curb & stairs with single point cane & prosthesis modified independent. (Target Date: 11/16/2014)   Time 8   Period Weeks   Status On-going   PT LONG TERM GOAL #7   Title demonstrates proper technique to safely  lift, carry & climb A-frame ladders. (Target Date: 11/16/2014)   Time 8   Period Weeks   Status On-going           Plan - 10/25/14 0802    Clinical Impression Statement Pt making steady progress toward goals, limited by decreased activity tolerance.    Pt will benefit from skilled therapeutic intervention in order to improve on the following deficits Abnormal gait;Decreased activity tolerance;Decreased balance;Decreased endurance;Decreased knowledge of use of DME;Decreased mobility;Decreased strength;Postural dysfunction;Other (comment)  prosthetic dependency   Rehab Potential Good   PT Frequency 2x / week   PT Duration 8 weeks   PT Treatment/Interventions ADLs/Self Care Home Management;DME Instruction;Gait training;Stair training;Functional mobility training;Therapeutic activities;Therapeutic exercise;Balance training;Neuromuscular re-education;Patient/family education;Other (comment)  prosthetic training   PT Next Visit Plan gait with single point cane, balance activities   Consulted and Agree with Plan of Care Patient        Problem List Patient Active Problem List   Diagnosis Date Noted  . CKD stage 4 due to type 2 diabetes mellitus 08/22/2014  . S/P BKA (below knee amputation) unilateral 05/30/2014  . Complete amputation of right foot 04/23/2014  . End stage renal disease 04/11/2013  . Chronic hyperkalemia   . CKD (chronic kidney disease)   .  Vitamin D deficiency   . Diabetes mellitus type 2 with complications   . Hypertension   . Prostate cancer   . Undescended testicle     Willow Ora 10/25/2014, 9:20 PM  Willow Ora, PTA, [facility] [address], [place], [place] [address] [phone] 10/25/2014, 9:20 PM

## 2014-10-26 ENCOUNTER — Encounter (HOSPITAL_COMMUNITY)
Admission: RE | Admit: 2014-10-26 | Discharge: 2014-10-26 | Disposition: A | Discharge status: Home / Self Care | Payer: Medicare PPO | Source: Ambulatory Visit | Attending: Nephrology | Admitting: Nephrology

## 2014-10-26 DIAGNOSIS — Z79899 Other long term (current) drug therapy: Secondary | ICD-10-CM | POA: Diagnosis not present

## 2014-10-26 DIAGNOSIS — Z5181 Encounter for therapeutic drug level monitoring: Secondary | ICD-10-CM | POA: Diagnosis not present

## 2014-10-26 DIAGNOSIS — N184 Chronic kidney disease, stage 4 (severe): Secondary | ICD-10-CM | POA: Diagnosis present

## 2014-10-26 DIAGNOSIS — D509 Iron deficiency anemia, unspecified: Secondary | ICD-10-CM | POA: Diagnosis not present

## 2014-10-26 DIAGNOSIS — D631 Anemia in chronic kidney disease: Secondary | ICD-10-CM | POA: Insufficient documentation

## 2014-10-26 LAB — POCT HEMOGLOBIN-HEMACUE: Hemoglobin: 9.6 g/dL — ABNORMAL LOW (ref 13.0–17.0)

## 2014-10-26 MED ORDER — SODIUM CHLORIDE 0.9 % IV SOLN
510.0000 mg | Freq: Once | INTRAVENOUS | Status: AC
  Administered 2014-10-26: 510 mg via INTRAVENOUS
  Filled 2014-10-26: qty 17

## 2014-10-26 MED ORDER — DARBEPOETIN ALFA 100 MCG/0.5ML IJ SOSY
100.0000 ug | PREFILLED_SYRINGE | INTRAMUSCULAR | Status: DC
  Administered 2014-10-26: 100 ug via SUBCUTANEOUS

## 2014-10-26 MED ORDER — DARBEPOETIN ALFA 100 MCG/0.5ML IJ SOSY
PREFILLED_SYRINGE | INTRAMUSCULAR | Status: AC
  Filled 2014-10-26: qty 0.5

## 2014-10-30 ENCOUNTER — Encounter: Payer: Self-pay | Admitting: Physical Therapy

## 2014-10-30 ENCOUNTER — Ambulatory Visit: Payer: Medicare PPO | Admitting: Physical Therapy

## 2014-10-30 DIAGNOSIS — R6889 Other general symptoms and signs: Secondary | ICD-10-CM

## 2014-10-30 DIAGNOSIS — R2689 Other abnormalities of gait and mobility: Secondary | ICD-10-CM

## 2014-10-30 DIAGNOSIS — R531 Weakness: Secondary | ICD-10-CM

## 2014-10-30 DIAGNOSIS — Z89511 Acquired absence of right leg below knee: Secondary | ICD-10-CM

## 2014-10-30 DIAGNOSIS — R269 Unspecified abnormalities of gait and mobility: Secondary | ICD-10-CM | POA: Diagnosis not present

## 2014-10-30 NOTE — Therapy (Signed)
[facility] [address] [place], [place], [address] Phone: [phone]   Fax:  [phone]  Physical Therapy Treatment  Patient Details  Name: Phillip Herring MRN: [id number] Date of Birth: Feb 11, 1941 Referring Provider:  Orlena Sheldon, PA-C  Encounter Date: 10/30/2014      PT End of Session - 10/30/14 1400    Visit Number 11   Number of Visits 17   Date for PT Re-Evaluation 11/18/14   Authorization Type G-code every 10th visit. [employer]   PT Start Time 1402   PT Stop Time 1445   PT Time Calculation (min) 43 min   Equipment Utilized During Treatment Gait belt   Activity Tolerance Patient tolerated treatment well   Behavior During Therapy WFL for tasks assessed/performed      Past Medical History  Diagnosis Date  . Hypertension   . Undescended testicle   . Vitamin D deficiency   . Prostate cancer   . Chronic hyperkalemia 12/23/2012    Sees Renal  . NIDDM (non-insulin dependent diabetes mellitus)   . CKD (chronic kidney disease), stage IV     Past Surgical History  Procedure Laterality Date  . Foot amputation Right 2010    cut off 1/2 my foot"  . Refractive surgery Bilateral 2000's  . Av fistula placement Left 04/17/2013    Procedure: ARTERIOVENOUS (AV) FISTULA CREATION- LEFT RADIOCEPHALIC;  Surgeon: Rosetta Posner, MD;  Location: [facility];  Service: Vascular;  Laterality: Left;  . Shuntogram N/A 08/03/2013    Procedure: Earney Mallet;  Surgeon: Conrad Kinderhook, MD;  Location: [facility] CATH LAB;  Service: Cardiovascular;  Laterality: N/A;  . Colonoscopy    . Foot amputation Right 05/30/2014    transtibial  . Amputation Right 05/30/2014    Procedure: AMPUTATION BELOW KNEE;  Surgeon: Newt Minion, MD;  Location: [facility];  Service: Orthopedics;  Laterality: Right;    There were no vitals filed for this visit.  Visit Diagnosis:  Abnormality of gait  Decreased functional activity tolerance  Weakness generalized  Balance  problems  Status post below knee amputation of right lower extremity      Subjective Assessment - 10/30/14 1405    Subjective He has been using cane in home & walker in community. He has been driving to places close to his house.    Currently in Pain? No/denies                         [facility] Adult PT Treatment/Exercise - 10/30/14 1400    Transfers   Sit to Stand 5: Supervision;With upper extremity assist;From chair/3-in-1   Stand to Sit 5: Supervision;With upper extremity assist;To chair/3-in-1   Ambulation/Gait   Ambulation/Gait Yes   Ambulation/Gait Assistance 5: Supervision   Ambulation Distance (Feet) 500 Feet   Assistive device Prosthesis;Straight cane   Gait Pattern Step-through pattern;Decreased step length - left;Abducted- right   Ambulation Surface Indoor;Level   Stairs Yes   Stairs Assistance 5: Supervision   Stair Management Technique One rail Right;One rail Left;Step to pattern;Forwards;With cane   Number of Stairs 4  x 2 reps   Ramp 5: Supervision   Ramp Details (indicate cue type and reason) cues on wt shift   Curb 5: Supervision   Curb Details (indicate cue type and reason) cues on posture   High Level Balance   High Level Balance Activities Side stepping;Backward walking;Direction changes;Turns;Figure 8 turns;Negotitating around obstacles;Negotiating over obstacles  stepping forward over  5" beam & sideways over 8" beam   High Level Balance Comments PT demo technique to negotiate around /turn with prostheis and over obstacles   Knee/Hip Exercises: Aerobic   Stationary Bike --   Prosthetics   Current prosthetic wear tolerance (days/week)  daily   Current prosthetic wear tolerance (#hours/day)  all awake hours  drying every 3-4 hours and as needed   Current prosthetic weight-bearing tolerance (hours/day)  15 minutes   Residual limb condition  intact per pt report     Patient ambulated 125' without device with only prosthesis with min guard.   Lifting 4# from floor & 15# box with PT demo prior to activity and verbal cues during activity. Standing with RW support: rolling tennis ball under left foot to facilitate stability on prosthesis. Driving golf balls standing on grass X 5 minutes with SBA.              PT Short Term Goals - 10/18/14 1321    PT SHORT TERM GOAL #1   Title Patient verbalizes basic understanding of prosthetic care with PT cues for adjusting ply socks & problem solving issues.    Baseline MET 10/16/14   Time 4   Period Weeks   Status Achieved   PT SHORT TERM GOAL #2   Title tolerates wear >8 hours /day daily with no skin issues nor undue discomfort.    Baseline MET 10/16/14   Time 4   Period Weeks   Status Achieved   PT SHORT TERM GOAL #3   Title reaches 10" & to floor without UE support with supervision. (NEW Target Date: 11/13/14)   Baseline Can reach 10" and to floor to retrieve object but requires UE support on [facility]   Time 4   Period Weeks   Status Not Met   PT SHORT TERM GOAL #4   Title ambulates 500' with LRAD & prosthesis with supervision / cues. (Target Date: 10/19/2014)   Baseline met on 10/18/14 with walker   Time 4   Period Weeks   Status Achieved   PT SHORT TERM GOAL #5   Title negotiates ramp, curb and stairs with LRAD & prosthesis with supervision. (Target Date: 10/19/2014)   Baseline met on 10/18/14 with walker and rails   Time 4   Period Weeks   Status Achieved           PT Long Term Goals - 09/25/14 1647    PT LONG TERM GOAL #1   Title independent in prosthetic care. (Target Date: 11/16/2014)   Time 8   Period Weeks   Status On-going   PT LONG TERM GOAL #2   Title Tolerates wear of prosthesis >90% of awake hours without skin issues or discomfort. (Target Date: 11/16/2014)   Time 8   Period Weeks   Status On-going   PT LONG TERM GOAL #3   Title Berg Balance >45/56 (Target Date: 11/16/2014)   Time 8   Period Weeks   Status On-going   PT LONG TERM GOAL #4   Title  ambulates around household furniture carrying objects without device except prosthesis independently. (Target Date: 11/16/2014)   Time 8   Period Weeks   Status On-going   PT LONG TERM GOAL #5   Title ambulates 1000' on various surfaces with single point cane & prosthesis modified independent. (Target Date: 11/16/2014)   Time 8   Period Weeks   Status On-going   PT LONG TERM GOAL #6   Title negotiates barriers including  ramp, curb & stairs with single point cane & prosthesis modified independent. (Target Date: 11/16/2014)   Time 8   Period Weeks   Status On-going   PT LONG TERM GOAL #7   Title demonstrates proper technique to safely lift, carry & climb A-frame ladders. (Target Date: 11/16/2014)   Time 8   Period Weeks   Status On-going               Plan - 10/30/14 1400    Clinical Impression Statement patient has improved his activity tolerance. His balance requires less assistance and is able to perform more advanced level activities.   Pt will benefit from skilled therapeutic intervention in order to improve on the following deficits Abnormal gait;Decreased activity tolerance;Decreased balance;Decreased endurance;Decreased knowledge of use of DME;Decreased mobility;Decreased strength;Postural dysfunction;Other (comment)  prosthetic dependency   Rehab Potential Good   PT Frequency 2x / week   PT Duration 8 weeks   PT Treatment/Interventions ADLs/Self Care Home Management;DME Instruction;Gait training;Stair training;Functional mobility training;Therapeutic activities;Therapeutic exercise;Balance training;Neuromuscular re-education;Patient/family education;Other (comment)  prosthetic training   PT Next Visit Plan gait with single point cane, balance activities   Consulted and Agree with Plan of Care Patient        Problem List Patient Active Problem List   Diagnosis Date Noted  . CKD stage 4 due to type 2 diabetes mellitus 08/22/2014  . S/P BKA (below knee amputation)  unilateral 05/30/2014  . Complete amputation of right foot 04/23/2014  . End stage renal disease 04/11/2013  . Chronic hyperkalemia   . CKD (chronic kidney disease)   . Vitamin D deficiency   . Diabetes mellitus type 2 with complications   . Hypertension   . Prostate cancer   . Undescended testicle     Breunna Nordmann PT, DPT 10/30/2014, 3:03 PM  [facility] [address] [place], [place], [address] Phone: [phone]   Fax:  [phone]

## 2014-11-01 ENCOUNTER — Encounter: Payer: Medicare PPO | Admitting: Physical Therapy

## 2014-11-02 ENCOUNTER — Encounter: Payer: Self-pay | Admitting: Physical Therapy

## 2014-11-02 ENCOUNTER — Ambulatory Visit: Payer: Medicare PPO | Admitting: Physical Therapy

## 2014-11-02 DIAGNOSIS — R269 Unspecified abnormalities of gait and mobility: Secondary | ICD-10-CM | POA: Diagnosis not present

## 2014-11-02 DIAGNOSIS — R6889 Other general symptoms and signs: Secondary | ICD-10-CM

## 2014-11-02 DIAGNOSIS — R531 Weakness: Secondary | ICD-10-CM

## 2014-11-02 DIAGNOSIS — R2689 Other abnormalities of gait and mobility: Secondary | ICD-10-CM

## 2014-11-04 NOTE — Therapy (Signed)
Phillip Herring [address] [place], [place], [address] Phone: [phone]   Fax:  [phone]  Physical Therapy Treatment  Patient Details  Name: Phillip Herring MRN: [id number] Date of Birth: June 22, 1940 Referring Provider:  Orlena Sheldon, PA-C  Encounter Date: 11/02/2014    11/02/14 1403  PT Visits / Re-Eval  Visit Number 12  Number of Visits 17  Date for PT Re-Evaluation 11/18/14  Authorization  Authorization Type G-code every 10th visit. [employer]  PT Time Calculation  PT Start Time 1400  PT Stop Time 1442  PT Time Calculation (min) 42 min  PT - End of Session  Equipment Utilized During Treatment Gait belt  Activity Tolerance Patient tolerated treatment well  Behavior During Therapy WFL for tasks assessed/performed     Past Medical History  Diagnosis Date  . Hypertension   . Undescended testicle   . Vitamin D deficiency   . Prostate cancer   . Chronic hyperkalemia 12/23/2012    Sees Renal  . NIDDM (non-insulin dependent diabetes mellitus)   . CKD (chronic kidney disease), stage IV     Past Surgical History  Procedure Laterality Date  . Foot amputation Right 2010    cut off 1/2 my foot"  . Refractive surgery Bilateral 2000's  . Av fistula placement Left 04/17/2013    Procedure: ARTERIOVENOUS (AV) FISTULA CREATION- LEFT RADIOCEPHALIC;  Surgeon: Rosetta Posner, MD;  Location: [place];  Service: Vascular;  Laterality: Left;  . Shuntogram N/A 08/03/2013    Procedure: Earney Mallet;  Surgeon: Conrad , MD;  Location: [facility] CATH LAB;  Service: Cardiovascular;  Laterality: N/A;  . Colonoscopy    . Foot amputation Right 05/30/2014    transtibial  . Amputation Right 05/30/2014    Procedure: AMPUTATION BELOW KNEE;  Surgeon: Newt Minion, MD;  Location: [place];  Service: Orthopedics;  Laterality: Right;    There were no vitals filed for this visit.  Visit Diagnosis:  Abnormality of gait  Decreased functional activity  tolerance  Weakness generalized  Balance problems     11/02/14 1402  Symptoms/Limitations  Subjective He has been using cane in home & walker in community. He has been driving to places close to his house. No falls or pain to report.  Pain Assessment  Currently in Pain? No/denies      11/02/14 1404  Transfers  Sit to Stand 5: Supervision;With upper extremity assist;From chair/3-in-1  Stand to Sit 5: Supervision;With upper extremity assist;To chair/3-in-1  Ambulation/Gait  Ambulation/Gait Yes  Ambulation/Gait Assistance 5: Supervision;4: Min guard  Ambulation Distance (Feet) 220 Feet (x1, 500 x1)  Assistive device Prosthesis;Straight cane  Gait Pattern Step-through pattern;Decreased stride length;Abducted- right  Ambulation Surface Level;Indoor  Gait velocity 13.10 sec's= 2.5 ft/sec  Stairs Yes  Stairs Assistance 5: Supervision  Stairs Assistance Details (indicate cue type and reason) cues on sequence and to advance hand on rail  Stair Management Technique One rail Right;One rail Left;Step to pattern;Forwards;With cane  Number of Stairs 4 (x 2 reps)  Ramp 5: Supervision (with cane/prosthesis)  Ramp Details (indicate cue type and reason) cues on techique  Curb 5: Supervision (with cane/prosthesis)  Curb Details (indicate cue type and reason) cues on posture  Dynamic Standing Balance  Dynamic Standing - Balance Support No upper extremity supported;During functional activity  Dynamic Standing - Level of Assistance 4: Min assist  Dynamic Standing - Balance Activities Foam balance beam;Eyes open;Eyes closed;Head turns;Head nods  Dynamic Standing - Comments standing with feet  across blue foam beam:  High Level Balance  High Level Balance Activities Negotitating around obstacles;Negotiating over obstacles;Side stepping;Marching forwards;Marching backwards;Tandem walking (tandem gait fwd/bwd)  High Level Balance Comments stepping over 4 black bosters of varied heights and  weaving around 4 cones with straight cane, min guard assist and cues on sequence/technique/posture                   Prosthetics  Current prosthetic wear tolerance (days/week)  daily  Current prosthetic wear tolerance (#hours/day)  all awake hours (drying every 3-4 hours)  Residual limb condition  intact per pt report  Education Provided Correct ply sock adjustment (drying with increased heat)  Person(s) Educated Patient  Education Method Explanation  Education Method Verbalized understanding  Donning Prosthesis 6  Doffing Prosthesis 6          PT Short Term Goals - 10/18/14 1321    PT SHORT TERM GOAL #1   Title Patient verbalizes basic understanding of prosthetic care with PT cues for adjusting ply socks & problem solving issues.    Baseline MET 10/16/14   Time 4   Period Weeks   Status Achieved   PT SHORT TERM GOAL #2   Title tolerates wear >8 hours /day daily with no skin issues nor undue discomfort.    Baseline MET 10/16/14   Time 4   Period Weeks   Status Achieved   PT SHORT TERM GOAL #3   Title reaches 10" & to floor without UE support with supervision. (NEW Target Date: 11/13/14)   Baseline Can reach 10" and to floor to retrieve object but requires UE support on [facility]   Time 4   Period Weeks   Status Not Met   PT SHORT TERM GOAL #4   Title ambulates 500' with LRAD & prosthesis with supervision / cues. (Target Date: 10/19/2014)   Baseline met on 10/18/14 with walker   Time 4   Period Weeks   Status Achieved   PT SHORT TERM GOAL #5   Title negotiates ramp, curb and stairs with LRAD & prosthesis with supervision. (Target Date: 10/19/2014)   Baseline met on 10/18/14 with walker and rails   Time 4   Period Weeks   Status Achieved           PT Long Term Goals - 09/25/14 1647    PT LONG TERM GOAL #1   Title independent in prosthetic care. (Target Date: 11/16/2014)   Time 8   Period Weeks   Status On-going   PT LONG TERM GOAL #2   Title Tolerates wear of  prosthesis >90% of awake hours without skin issues or discomfort. (Target Date: 11/16/2014)   Time 8   Period Weeks   Status On-going   PT LONG TERM GOAL #3   Title Berg Balance >45/56 (Target Date: 11/16/2014)   Time 8   Period Weeks   Status On-going   PT LONG TERM GOAL #4   Title ambulates around household furniture carrying objects without device except prosthesis independently. (Target Date: 11/16/2014)   Time 8   Period Weeks   Status On-going   PT LONG TERM GOAL #5   Title ambulates 1000' on various surfaces with single point cane & prosthesis modified independent. (Target Date: 11/16/2014)   Time 8   Period Weeks   Status On-going   PT LONG TERM GOAL #6   Title negotiates barriers including ramp, curb & stairs with single point cane & prosthesis modified independent. (Target Date: 11/16/2014)  Time 8   Period Weeks   Status On-going   PT LONG TERM GOAL #7   Title demonstrates proper technique to safely lift, carry & climb A-frame ladders. (Target Date: 11/16/2014)   Time 8   Period Weeks   Status On-going        11/02/14 1403  Plan  Clinical Impression Statement Pt making steady progress toward goals.  Pt will benefit from skilled therapeutic intervention in order to improve on the following deficits Abnormal gait;Decreased activity tolerance;Decreased balance;Decreased endurance;Decreased knowledge of use of DME;Decreased mobility;Decreased strength;Postural dysfunction;Other (comment) (prosthetic dependency)  Rehab Potential Good  PT Frequency 2x / week  PT Duration 8 weeks  PT Treatment/Interventions ADLs/Self Care Home Management;DME Instruction;Gait training;Stair training;Functional mobility training;Therapeutic activities;Therapeutic exercise;Balance training;Neuromuscular re-education;Patient/family education;Other (comment) (prosthetic training)  PT Next Visit Plan gait with single point cane, balance activities  Consulted and Agree with Plan of Care Patient      Problem List Patient Active Problem List   Diagnosis Date Noted  . CKD stage 4 due to type 2 diabetes mellitus 08/22/2014  . S/P BKA (below knee amputation) unilateral 05/30/2014  . Complete amputation of right foot 04/23/2014  . End stage renal disease 04/11/2013  . Chronic hyperkalemia   . CKD (chronic kidney disease)   . Vitamin D deficiency   . Diabetes mellitus type 2 with complications   . Hypertension   . Prostate cancer   . Undescended testicle     Willow Ora 11/04/2014, 7:12 PM  Willow Ora, PTA, [place] [address] [place], [place] [address] [phone] 11/04/2014, 7:18 PM

## 2014-11-06 ENCOUNTER — Encounter: Payer: Self-pay | Admitting: Physical Therapy

## 2014-11-06 ENCOUNTER — Ambulatory Visit: Payer: Medicare PPO | Admitting: Physical Therapy

## 2014-11-06 DIAGNOSIS — R269 Unspecified abnormalities of gait and mobility: Secondary | ICD-10-CM

## 2014-11-06 DIAGNOSIS — R2689 Other abnormalities of gait and mobility: Secondary | ICD-10-CM

## 2014-11-06 DIAGNOSIS — R531 Weakness: Secondary | ICD-10-CM

## 2014-11-06 DIAGNOSIS — R6889 Other general symptoms and signs: Secondary | ICD-10-CM

## 2014-11-06 NOTE — Therapy (Signed)
[facility] [address] [place], [place], [address] Phone: [phone]   Fax:  [phone]  Physical Therapy Treatment  Patient Details  Name: Phillip Herring MRN: [id number] Date of Birth: 1940-09-01 Referring Provider:  Orlena Sheldon, PA-C  Encounter Date: 11/06/2014      PT End of Session - 11/06/14 1322    Visit Number 13   Number of Visits 17   Date for PT Re-Evaluation 11/18/14   Authorization Type G-code every 10th visit. [employer].    Authorization Time Period submitted on 6/14 for more visits and date extension (current date auth ended on 11/03/14)   Authorization - Visit Number 13   Authorization - Number of Visits 12   PT Start Time 1316   PT Stop Time 1400   PT Time Calculation (min) 44 min   Equipment Utilized During Treatment Gait belt   Activity Tolerance Patient tolerated treatment well   Behavior During Therapy WFL for tasks assessed/performed      Past Medical History  Diagnosis Date  . Hypertension   . Undescended testicle   . Vitamin D deficiency   . Prostate cancer   . Chronic hyperkalemia 12/23/2012    Sees Renal  . NIDDM (non-insulin dependent diabetes mellitus)   . CKD (chronic kidney disease), stage IV     Past Surgical History  Procedure Laterality Date  . Foot amputation Right 2010    cut off 1/2 my foot"  . Refractive surgery Bilateral 2000's  . Av fistula placement Left 04/17/2013    Procedure: ARTERIOVENOUS (AV) FISTULA CREATION- LEFT RADIOCEPHALIC;  Surgeon: Rosetta Posner, MD;  Location: [place];  Service: Vascular;  Laterality: Left;  . Shuntogram N/A 08/03/2013    Procedure: Earney Mallet;  Surgeon: Conrad [place], MD;  Location: [facility] CATH LAB;  Service: Cardiovascular;  Laterality: N/A;  . Colonoscopy    . Foot amputation Right 05/30/2014    transtibial  . Amputation Right 05/30/2014    Procedure: AMPUTATION BELOW KNEE;  Surgeon: Newt Minion, MD;  Location: [place];  Service: Orthopedics;   Laterality: Right;    There were no vitals filed for this visit.  Visit Diagnosis:  Abnormality of gait  Decreased functional activity tolerance  Weakness generalized  Balance problems      Subjective Assessment - 11/06/14 1320    Subjective No new complaints. No falls or pain to report. Reports he drove all the way here today (with his brother in law riding with him).   Currently in Pain? No/denies           [facility] Adult PT Treatment/Exercise - 11/06/14 1324    Transfers   Sit to Stand 5: Supervision;With upper extremity assist;From chair/3-in-1   Stand to Sit 5: Supervision;With upper extremity assist;To chair/3-in-1   Ambulation/Gait   Ambulation/Gait Yes   Ambulation/Gait Assistance 5: Supervision;4: Min guard   Ambulation/Gait Assistance Details cues on hip/knee extension, trunk extension and for equal step length with gait.  Pt with tight hip flexors and flexed knees with gait. No changes made iin leg length discrepancy due to this. Will continue to reassess need for adjustments to prosthetic height.                     Ambulation Distance (Feet) 120 Feet  x1, 230 x1 both indoors;500 x1 outdoors   Assistive device Prosthesis;Straight cane   Gait Pattern Step-through pattern;Decreased stride length   Ambulation Surface Level;Unlevel;Indoor;Outdoor;Paved;Gravel;Grass   Prosthetics  Prosthetic Care Comments  Pt's prosthesis is 1/4 inch shorter than his left leg after removing the heel wedge from the left shoe.                  Current prosthetic wear tolerance (days/week)  daily   Current prosthetic wear tolerance (#hours/day)  --  drying every 3-4 hours   Residual limb condition  intact per pt report   Donning Prosthesis Modified independent (device/increased time)   Doffing Prosthesis Independent      Exercise Lying on mat - bridge 5 sec hold x 10 reps with cues on form and for as much hip extension as possible - hip flexor stretch off edge of mat with foot on 4  inch box, hold x 3-4 minutes, bil sides - hip flexion off/onto mat with knee flexion x 10 reps bil legs        PT Short Term Goals - 10/18/14 1321    PT SHORT TERM GOAL #1   Title Patient verbalizes basic understanding of prosthetic care with PT cues for adjusting ply socks & problem solving issues.    Baseline MET 10/16/14   Time 4   Period Weeks   Status Achieved   PT SHORT TERM GOAL #2   Title tolerates wear >8 hours /day daily with no skin issues nor undue discomfort.    Baseline MET 10/16/14   Time 4   Period Weeks   Status Achieved   PT SHORT TERM GOAL #3   Title reaches 10" & to floor without UE support with supervision. (NEW Target Date: 11/13/14)   Baseline Can reach 10" and to floor to retrieve object but requires UE support on [facility]   Time 4   Period Weeks   Status Not Met   PT SHORT TERM GOAL #4   Title ambulates 500' with LRAD & prosthesis with supervision / cues. (Target Date: 10/19/2014)   Baseline met on 10/18/14 with walker   Time 4   Period Weeks   Status Achieved   PT SHORT TERM GOAL #5   Title negotiates ramp, curb and stairs with LRAD & prosthesis with supervision. (Target Date: 10/19/2014)   Baseline met on 10/18/14 with walker and rails   Time 4   Period Weeks   Status Achieved           PT Long Term Goals - 09/25/14 1647    PT LONG TERM GOAL #1   Title independent in prosthetic care. (Target Date: 11/16/2014)   Time 8   Period Weeks   Status On-going   PT LONG TERM GOAL #2   Title Tolerates wear of prosthesis >90% of awake hours without skin issues or discomfort. (Target Date: 11/16/2014)   Time 8   Period Weeks   Status On-going   PT LONG TERM GOAL #3   Title Berg Balance >45/56 (Target Date: 11/16/2014)   Time 8   Period Weeks   Status On-going   PT LONG TERM GOAL #4   Title ambulates around household furniture carrying objects without device except prosthesis independently. (Target Date: 11/16/2014)   Time 8   Period Weeks   Status  On-going   PT LONG TERM GOAL #5   Title ambulates 1000' on various surfaces with single point cane & prosthesis modified independent. (Target Date: 11/16/2014)   Time 8   Period Weeks   Status On-going   PT LONG TERM GOAL #6   Title negotiates barriers including ramp, curb & stairs with single  point cane & prosthesis modified independent. (Target Date: 11/16/2014)   Time 8   Period Weeks   Status On-going   PT LONG TERM GOAL #7   Title demonstrates proper technique to safely lift, carry & climb A-frame ladders. (Target Date: 11/16/2014)   Time 8   Period Weeks   Status On-going           Plan - 11/06/14 1323    Clinical Impression Statement Pt continues to make steady progress toward goals. Using his walker more and wheelchair less. Steady with cane on level surfaces.   Pt will benefit from skilled therapeutic intervention in order to improve on the following deficits Abnormal gait;Decreased activity tolerance;Decreased balance;Decreased endurance;Decreased knowledge of use of DME;Decreased mobility;Decreased strength;Postural dysfunction;Other (comment)  prosthetic dependency   Rehab Potential Good   PT Frequency 2x / week   PT Duration 8 weeks   PT Treatment/Interventions ADLs/Self Care Home Management;DME Instruction;Gait training;Stair training;Functional mobility training;Therapeutic activities;Therapeutic exercise;Balance training;Neuromuscular re-education;Patient/family education;Other (comment)  prosthetic training   PT Next Visit Plan gait with single point cane, balance activities   Consulted and Agree with Plan of Care Patient        Problem List Patient Active Problem List   Diagnosis Date Noted  . CKD stage 4 due to type 2 diabetes mellitus 08/22/2014  . S/P BKA (below knee amputation) unilateral 05/30/2014  . Complete amputation of right foot 04/23/2014  . End stage renal disease 04/11/2013  . Chronic hyperkalemia   . CKD (chronic kidney disease)   . Vitamin  D deficiency   . Diabetes mellitus type 2 with complications   . Hypertension   . Prostate cancer   . Undescended testicle     Willow Ora 11/07/2014, 7:36 PM  Willow Ora, PTA, [place] [address], [place], [place] [address] [phone] 11/07/2014, 7:36 PM

## 2014-11-08 ENCOUNTER — Ambulatory Visit: Payer: Medicare PPO | Admitting: Physical Therapy

## 2014-11-08 ENCOUNTER — Encounter: Payer: Self-pay | Admitting: Physical Therapy

## 2014-11-08 DIAGNOSIS — R269 Unspecified abnormalities of gait and mobility: Secondary | ICD-10-CM | POA: Diagnosis not present

## 2014-11-08 DIAGNOSIS — R6889 Other general symptoms and signs: Secondary | ICD-10-CM

## 2014-11-08 DIAGNOSIS — R2689 Other abnormalities of gait and mobility: Secondary | ICD-10-CM

## 2014-11-08 DIAGNOSIS — R531 Weakness: Secondary | ICD-10-CM

## 2014-11-08 DIAGNOSIS — Z89511 Acquired absence of right leg below knee: Secondary | ICD-10-CM

## 2014-11-08 NOTE — Therapy (Signed)
[facility] [address] [place], [place], [address] Phone: [phone]   Fax:  [phone]  Physical Therapy Treatment  Patient Details  Name: Phillip Herring MRN: [id number] Date of Birth: Sep 06, 1940 Referring Provider:  Orlena Sheldon, PA-C  Encounter Date: 11/08/2014      PT End of Session - 11/08/14 1315    Visit Number 14   Number of Visits 17   Date for PT Re-Evaluation 11/18/14   Authorization Type G-code every 10th visit. [employer].    Authorization Time Period submitted on 6/14 for more visits and date extension (current date auth ended on 11/03/14)   PT Start Time 1317   PT Stop Time 1401   PT Time Calculation (min) 44 min   Equipment Utilized During Treatment Gait belt   Activity Tolerance Patient tolerated treatment well   Behavior During Therapy WFL for tasks assessed/performed      Past Medical History  Diagnosis Date  . Hypertension   . Undescended testicle   . Vitamin D deficiency   . Prostate cancer   . Chronic hyperkalemia 12/23/2012    Sees Renal  . NIDDM (non-insulin dependent diabetes mellitus)   . CKD (chronic kidney disease), stage IV     Past Surgical History  Procedure Laterality Date  . Foot amputation Right 2010    cut off 1/2 my foot"  . Refractive surgery Bilateral 2000's  . Av fistula placement Left 04/17/2013    Procedure: ARTERIOVENOUS (AV) FISTULA CREATION- LEFT RADIOCEPHALIC;  Surgeon: Rosetta Posner, MD;  Location: [facility];  Service: Vascular;  Laterality: Left;  . Shuntogram N/A 08/03/2013    Procedure: Earney Mallet;  Surgeon: Conrad , MD;  Location: [facility] CATH LAB;  Service: Cardiovascular;  Laterality: N/A;  . Colonoscopy    . Foot amputation Right 05/30/2014    transtibial  . Amputation Right 05/30/2014    Procedure: AMPUTATION BELOW KNEE;  Surgeon: Newt Minion, MD;  Location: [facility];  Service: Orthopedics;  Laterality: Right;    There were no vitals filed for this  visit.  Visit Diagnosis:  Abnormality of gait  Balance problems  Decreased functional activity tolerance  Weakness generalized  Status post below knee amputation of right lower extremity      Subjective Assessment - 11/08/14 1318    Subjective Wearing prosthesis all day without issues. No falls.   Currently in Pain? No/denies                         [facility] Adult PT Treatment/Exercise - 11/08/14 1315    Transfers   Sit to Stand 5: Supervision;With upper extremity assist;From chair/3-in-1   Stand to Sit 5: Supervision;With upper extremity assist;To chair/3-in-1   Ambulation/Gait   Ambulation/Gait Yes   Ambulation/Gait Assistance 5: Supervision   Ambulation/Gait Assistance Details cues on posture & step width   Ambulation Distance (Feet) 400 Feet  X 2   Assistive device Prosthesis;Straight cane   Gait Pattern Step-through pattern;Decreased stride length;Abducted- right   Ambulation Surface Indoor;Level   Gait velocity --   Stairs Yes   Stairs Assistance 5: Supervision   Stair Management Technique One rail Right;One rail Left;Step to pattern;Forwards;With cane   Number of Stairs 4  x 2 reps   Ramp 5: Supervision  with cane/prosthesis   Ramp Details (indicate cue type and reason) cues on posture & wt shift   Curb 5: Supervision  with cane/prosthesis   Curb Details (indicate  cue type and reason) cues on sequence   Dynamic Standing Balance   Dynamic Standing - Balance Support No upper extremity supported;During functional activity  In parallel bars   Dynamic Standing - Level of Assistance 4: Min assist   Dynamic Standing - Balance Activities Foam balance beam;Eyes open;Eyes closed;Head turns;Head nods;[place];Reaching for objects;Compliant surfaces;Step ups   Dynamic Standing - Comments tactile / manual cues on righting reaction  green theraband row, upward reach, forward reach reciprocal    High Level Balance   High Level Balance Activities --   High  Level Balance Comments --   Prosthetics   Current prosthetic wear tolerance (days/week)  daily   Current prosthetic wear tolerance (#hours/day)  all awake hours  drying every 3-4 hours   Residual limb condition  intact   Donning Prosthesis Modified independent (device/increased time)   Doffing Prosthesis Independent                  PT Short Term Goals - 10/18/14 1321    PT SHORT TERM GOAL #1   Title Patient verbalizes basic understanding of prosthetic care with PT cues for adjusting ply socks & problem solving issues.    Baseline MET 10/16/14   Time 4   Period Weeks   Status Achieved   PT SHORT TERM GOAL #2   Title tolerates wear >8 hours /day daily with no skin issues nor undue discomfort.    Baseline MET 10/16/14   Time 4   Period Weeks   Status Achieved   PT SHORT TERM GOAL #3   Title reaches 10" & to floor without UE support with supervision. (NEW Target Date: 11/13/14)   Baseline Can reach 10" and to floor to retrieve object but requires UE support on [facility]   Time 4   Period Weeks   Status Not Met   PT SHORT TERM GOAL #4   Title ambulates 500' with LRAD & prosthesis with supervision / cues. (Target Date: 10/19/2014)   Baseline met on 10/18/14 with walker   Time 4   Period Weeks   Status Achieved   PT SHORT TERM GOAL #5   Title negotiates ramp, curb and stairs with LRAD & prosthesis with supervision. (Target Date: 10/19/2014)   Baseline met on 10/18/14 with walker and rails   Time 4   Period Weeks   Status Achieved           PT Long Term Goals - 09/25/14 1647    PT LONG TERM GOAL #1   Title independent in prosthetic care. (Target Date: 11/16/2014)   Time 8   Period Weeks   Status On-going   PT LONG TERM GOAL #2   Title Tolerates wear of prosthesis >90% of awake hours without skin issues or discomfort. (Target Date: 11/16/2014)   Time 8   Period Weeks   Status On-going   PT LONG TERM GOAL #3   Title Berg Balance >45/56 (Target Date: 11/16/2014)   Time 8    Period Weeks   Status On-going   PT LONG TERM GOAL #4   Title ambulates around household furniture carrying objects without device except prosthesis independently. (Target Date: 11/16/2014)   Time 8   Period Weeks   Status On-going   PT LONG TERM GOAL #5   Title ambulates 1000' on various surfaces with single point cane & prosthesis modified independent. (Target Date: 11/16/2014)   Time 8   Period Weeks   Status On-going   PT LONG TERM GOAL #  6   Title negotiates barriers including ramp, curb & stairs with single point cane & prosthesis modified independent. (Target Date: 11/16/2014)   Time 8   Period Weeks   Status On-going   PT LONG TERM GOAL #7   Title demonstrates proper technique to safely lift, carry & climb A-frame ladders. (Target Date: 11/16/2014)   Time 8   Period Weeks   Status On-going               Plan - 11/08/14 1315    Clinical Impression Statement Patient is on target to meet LTGs next week. He is functioning in home with cane. PT recommended using cane for community activities. Patient needs cues for lifting and occasional assist for high level balance activities.   Pt will benefit from skilled therapeutic intervention in order to improve on the following deficits Abnormal gait;Decreased activity tolerance;Decreased balance;Decreased endurance;Decreased knowledge of use of DME;Decreased mobility;Decreased strength;Postural dysfunction;Other (comment)  prosthetic dependency   Rehab Potential Good   PT Frequency 2x / week   PT Duration 8 weeks   PT Treatment/Interventions ADLs/Self Care Home Management;DME Instruction;Gait training;Stair training;Functional mobility training;Therapeutic activities;Therapeutic exercise;Balance training;Neuromuscular re-education;Patient/family education;Other (comment)  prosthetic training   PT Next Visit Plan Assess LTGs and discharge next week, gait with single point cane for community & no device for home, balance activities    Consulted and Agree with Plan of Care Patient        Problem List Patient Active Problem List   Diagnosis Date Noted  . CKD stage 4 due to type 2 diabetes mellitus 08/22/2014  . S/P BKA (below knee amputation) unilateral 05/30/2014  . Complete amputation of right foot 04/23/2014  . End stage renal disease 04/11/2013  . Chronic hyperkalemia   . CKD (chronic kidney disease)   . Vitamin D deficiency   . Diabetes mellitus type 2 with complications   . Hypertension   . Prostate cancer   . Undescended testicle     Adilson Grafton PT, DPT 11/08/2014, 6:02 PM  [facility] [address] [place], [place], [address] Phone: [phone]   Fax:  [phone]

## 2014-11-13 ENCOUNTER — Encounter: Payer: Self-pay | Admitting: Physical Therapy

## 2014-11-13 ENCOUNTER — Ambulatory Visit (INDEPENDENT_AMBULATORY_CARE_PROVIDER_SITE_OTHER): Payer: Medicare PPO | Admitting: Podiatry

## 2014-11-13 ENCOUNTER — Encounter: Payer: Self-pay | Admitting: Podiatry

## 2014-11-13 ENCOUNTER — Ambulatory Visit: Payer: Medicare PPO | Admitting: Physical Therapy

## 2014-11-13 VITALS — BP 115/49 | HR 75 | Resp 16

## 2014-11-13 DIAGNOSIS — R6889 Other general symptoms and signs: Secondary | ICD-10-CM

## 2014-11-13 DIAGNOSIS — B351 Tinea unguium: Secondary | ICD-10-CM

## 2014-11-13 DIAGNOSIS — R269 Unspecified abnormalities of gait and mobility: Secondary | ICD-10-CM

## 2014-11-13 DIAGNOSIS — M79676 Pain in unspecified toe(s): Secondary | ICD-10-CM

## 2014-11-13 DIAGNOSIS — R2689 Other abnormalities of gait and mobility: Secondary | ICD-10-CM

## 2014-11-13 DIAGNOSIS — Z89511 Acquired absence of right leg below knee: Secondary | ICD-10-CM

## 2014-11-13 DIAGNOSIS — R531 Weakness: Secondary | ICD-10-CM

## 2014-11-13 NOTE — Therapy (Signed)
[facility] [address] [place], [place], [address] Phone: [phone]   Fax:  [phone]  Physical Therapy Treatment  Patient Details  Name: Phillip Herring MRN: [id number] Date of Birth: Oct 05, 1940 Referring Provider:  Orlena Sheldon, PA-C  Encounter Date: 11/13/2014      PT End of Session - 11/13/14 1315    Visit Number 15   Number of Visits 17   Date for PT Re-Evaluation 11/18/14   Authorization Type G-code every 10th visit. [employer].    Authorization Time Period submitted on 6/14 for more visits and date extension (current date auth ended on 11/03/14)   PT Start Time 1316   PT Stop Time 1400   PT Time Calculation (min) 44 min   Equipment Utilized During Treatment Gait belt   Activity Tolerance Patient tolerated treatment well   Behavior During Therapy WFL for tasks assessed/performed      Past Medical History  Diagnosis Date  . Hypertension   . Undescended testicle   . Vitamin D deficiency   . Prostate cancer   . Chronic hyperkalemia 12/23/2012    Sees Renal  . NIDDM (non-insulin dependent diabetes mellitus)   . CKD (chronic kidney disease), stage IV     Past Surgical History  Procedure Laterality Date  . Foot amputation Right 2010    cut off 1/2 my foot"  . Refractive surgery Bilateral 2000's  . Av fistula placement Left 04/17/2013    Procedure: ARTERIOVENOUS (AV) FISTULA CREATION- LEFT RADIOCEPHALIC;  Surgeon: Rosetta Posner, MD;  Location: [facility];  Service: Vascular;  Laterality: Left;  . Shuntogram N/A 08/03/2013    Procedure: Earney Mallet;  Surgeon: Conrad [place], MD;  Location: [facility] CATH LAB;  Service: Cardiovascular;  Laterality: N/A;  . Colonoscopy    . Foot amputation Right 05/30/2014    transtibial  . Amputation Right 05/30/2014    Procedure: AMPUTATION BELOW KNEE;  Surgeon: Newt Minion, MD;  Location: [facility];  Service: Orthopedics;  Laterality: Right;    There were no vitals filed for this  visit.  Visit Diagnosis:  Abnormality of gait  Status post below knee amputation of right lower extremity  Balance problems  Decreased functional activity tolerance  Weakness generalized      Subjective Assessment - 11/13/14 1324    Subjective (p) wears prosthesis all awake hours except takes it off for a few minutes to let it cool.   Currently in Pain? (p) No/denies                         [facility] Adult PT Treatment/Exercise - 11/13/14 1315    Transfers   Sit to Stand 5: Supervision;With upper extremity assist;From chair/3-in-1   Stand to Sit 5: Supervision;With upper extremity assist;To chair/3-in-1   Ambulation/Gait   Ambulation/Gait Yes   Ambulation/Gait Assistance 5: Supervision   Ambulation/Gait Assistance Details cues on posture   Ambulation Distance (Feet) 604 Feet  604' X 1, 562' X 1 - with cane 125' without device   Assistive device Prosthesis;Straight cane   Gait Pattern Step-through pattern;Decreased stride length;Abducted- right   Ambulation Surface Indoor;Level;Outdoor;Unlevel;Paved;Grass   Stairs Yes   Stairs Assistance 5: Supervision   Stair Management Technique One rail Right;One rail Left;Step to pattern;Forwards;With cane   Number of Stairs 4  x 2 reps   Ramp 5: Supervision  with cane/prosthesis   Curb 5: Supervision  with cane/prosthesis   Gait Comments no device for  household level & cane for community   Dynamic Standing Balance   Dynamic Standing - Balance Support --   Dynamic Standing - Level of Assistance --   Dynamic Standing - Balance Activities --   Prosthetics   Current prosthetic wear tolerance (days/week)  daily   Current prosthetic wear tolerance (#hours/day)  all awake hours  drying every 3-4 hours   Residual limb condition  intact, no open areas   Education Provided Residual limb care  use of lotion for dry skin   Person(s) Educated Patient   Education Method Explanation   Education Method Verbalized understanding    Donning Prosthesis Modified independent (device/increased time)   Doffing Prosthesis Independent     Patient able to lift & carry 15# box with only prosthesis with minimal cues. PT demo climbing A-frame ladder and performing 2-handed task on ladder. Patient able to return demo / verbalize understanding.           PT Education - 11/13/14 1315    Education provided Yes   Education Details climbing A-frame ladder   Person(s) Educated Patient   Methods Explanation;Demonstration;Verbal cues   Comprehension Verbalized understanding;Returned demonstration;Verbal cues required;Need further instruction          PT Short Term Goals - 10/18/14 1321    PT SHORT TERM GOAL #1   Title Patient verbalizes basic understanding of prosthetic care with PT cues for adjusting ply socks & problem solving issues.    Baseline MET 10/16/14   Time 4   Period Weeks   Status Achieved   PT SHORT TERM GOAL #2   Title tolerates wear >8 hours /day daily with no skin issues nor undue discomfort.    Baseline MET 10/16/14   Time 4   Period Weeks   Status Achieved   PT SHORT TERM GOAL #3   Title reaches 10" & to floor without UE support with supervision. (NEW Target Date: 11/13/14)   Baseline Can reach 10" and to floor to retrieve object but requires UE support on [facility]   Time 4   Period Weeks   Status Not Met   PT SHORT TERM GOAL #4   Title ambulates 500' with LRAD & prosthesis with supervision / cues. (Target Date: 10/19/2014)   Baseline met on 10/18/14 with walker   Time 4   Period Weeks   Status Achieved   PT SHORT TERM GOAL #5   Title negotiates ramp, curb and stairs with LRAD & prosthesis with supervision. (Target Date: 10/19/2014)   Baseline met on 10/18/14 with walker and rails   Time 4   Period Weeks   Status Achieved           PT Long Term Goals - 09/25/14 1647    PT LONG TERM GOAL #1   Title independent in prosthetic care. (Target Date: 11/16/2014)   Time 8   Period Weeks   Status  On-going   PT LONG TERM GOAL #2   Title Tolerates wear of prosthesis >90% of awake hours without skin issues or discomfort. (Target Date: 11/16/2014)   Time 8   Period Weeks   Status On-going   PT LONG TERM GOAL #3   Title Berg Balance >45/56 (Target Date: 11/16/2014)   Time 8   Period Weeks   Status On-going   PT LONG TERM GOAL #4   Title ambulates around household furniture carrying objects without device except prosthesis independently. (Target Date: 11/16/2014)   Time 8   Period Weeks   Status  On-going   PT LONG TERM GOAL #5   Title ambulates 1000' on various surfaces with single point cane & prosthesis modified independent. (Target Date: 11/16/2014)   Time 8   Period Weeks   Status On-going   PT LONG TERM GOAL #6   Title negotiates barriers including ramp, curb & stairs with single point cane & prosthesis modified independent. (Target Date: 11/16/2014)   Time 8   Period Weeks   Status On-going   PT LONG TERM GOAL #7   Title demonstrates proper technique to safely lift, carry & climb A-frame ladders. (Target Date: 11/16/2014)   Time 8   Period Weeks   Status On-going               Plan - 11/13/14 1315    Clinical Impression Statement Patient appears ready for discharge next session. He reports increased activity level now that he has had PT. Patient appears safe without device for household and limited community. With full community distances and grass he needs a single point cane.   Pt will benefit from skilled therapeutic intervention in order to improve on the following deficits Abnormal gait;Decreased activity tolerance;Decreased balance;Decreased endurance;Decreased knowledge of use of DME;Decreased mobility;Decreased strength;Postural dysfunction;Other (comment)  prosthetic dependency   Rehab Potential Good   PT Frequency 2x / week   PT Duration 8 weeks   PT Treatment/Interventions ADLs/Self Care Home Management;DME Instruction;Gait training;Stair  training;Functional mobility training;Therapeutic activities;Therapeutic exercise;Balance training;Neuromuscular re-education;Patient/family education;Other (comment)  prosthetic training   PT Next Visit Plan Assess LTGs and discharge next visit, gait with single point cane for community & no device for home, balance activities   Consulted and Agree with Plan of Care Patient        Problem List Patient Active Problem List   Diagnosis Date Noted  . CKD stage 4 due to type 2 diabetes mellitus 08/22/2014  . S/P BKA (below knee amputation) unilateral 05/30/2014  . Complete amputation of right foot 04/23/2014  . End stage renal disease 04/11/2013  . Chronic hyperkalemia   . CKD (chronic kidney disease)   . Vitamin D deficiency   . Diabetes mellitus type 2 with complications   . Hypertension   . Prostate cancer   . Undescended testicle     Morgann Woodburn PT, DPT 11/13/2014, 2:08 PM  [facility] [address] [place], [place], [address] Phone: [phone]   Fax:  [phone]

## 2014-11-15 ENCOUNTER — Encounter: Payer: Self-pay | Admitting: Physical Therapy

## 2014-11-15 ENCOUNTER — Ambulatory Visit: Payer: Medicare PPO | Admitting: Physical Therapy

## 2014-11-15 DIAGNOSIS — R269 Unspecified abnormalities of gait and mobility: Secondary | ICD-10-CM | POA: Diagnosis not present

## 2014-11-15 DIAGNOSIS — R2689 Other abnormalities of gait and mobility: Secondary | ICD-10-CM

## 2014-11-15 DIAGNOSIS — R6889 Other general symptoms and signs: Secondary | ICD-10-CM

## 2014-11-15 DIAGNOSIS — Z89511 Acquired absence of right leg below knee: Secondary | ICD-10-CM

## 2014-11-15 NOTE — Therapy (Signed)
[facility] [address] [place], [place], [address] Phone: [phone]   Fax:  [phone]  Physical Therapy Treatment  Patient Details  Name: Phillip Herring MRN: [id number] Date of Birth: 05-14-1941 Referring Provider:  Orlena Sheldon, PA-C  Encounter Date: 11/15/2014      PT End of Session - 11/15/14 1100    Visit Number 16   Number of Visits 17   Date for PT Re-Evaluation 11/18/14   Authorization Type G-code every 10th visit. [employer].    Authorization Time Period submitted on 6/14 for more visits and date extension (current date auth ended on 11/03/14)   PT Start Time 1100   PT Stop Time 1138   PT Time Calculation (min) 38 min   Equipment Utilized During Treatment Gait belt   Activity Tolerance Patient tolerated treatment well   Behavior During Therapy WFL for tasks assessed/performed      Past Medical History  Diagnosis Date  . Hypertension   . Undescended testicle   . Vitamin D deficiency   . Prostate cancer   . Chronic hyperkalemia 12/23/2012    Sees Renal  . NIDDM (non-insulin dependent diabetes mellitus)   . CKD (chronic kidney disease), stage IV     Past Surgical History  Procedure Laterality Date  . Foot amputation Right 2010    cut off 1/2 my foot"  . Refractive surgery Bilateral 2000's  . Av fistula placement Left 04/17/2013    Procedure: ARTERIOVENOUS (AV) FISTULA CREATION- LEFT RADIOCEPHALIC;  Surgeon: Rosetta Posner, MD;  Location: [facility];  Service: Vascular;  Laterality: Left;  . Shuntogram N/A 08/03/2013    Procedure: Earney Mallet;  Surgeon: Conrad Hytop, MD;  Location: [facility];  Service: Cardiovascular;  Laterality: N/A;  . Colonoscopy    . Foot amputation Right 05/30/2014    transtibial  . Amputation Right 05/30/2014    Procedure: AMPUTATION BELOW KNEE;  Surgeon: Newt Minion, MD;  Location: [facility];  Service: Orthopedics;  Laterality: Right;    There were no vitals filed for this  visit.  Visit Diagnosis:  Abnormality of gait  Status post below knee amputation of right lower extremity  Balance problems  Decreased functional activity tolerance          [facility] PT Assessment - 11/15/14 1100    Berg Balance Test   Sit to Stand Able to stand  independently using hands   Standing Unsupported Able to stand safely 2 minutes   Sitting with Back Unsupported but Feet Supported on Floor or Stool Able to sit safely and securely 2 minutes   Stand to Sit Sits safely with minimal use of hands   Transfers Able to transfer safely, minor use of hands   Standing Unsupported with Eyes Closed Able to stand 10 seconds with supervision   Standing Ubsupported with Feet Together Able to place feet together independently and stand 1 minute safely   From Standing, Reach Forward with Outstretched Arm Can reach confidently >25 cm (10")   From Standing Position, Pick up Object from Floor Able to pick up shoe safely and easily   From Standing Position, Turn to Look Behind Over each Shoulder Looks behind from both sides and weight shifts well   Turn 360 Degrees Able to turn 360 degrees safely one side only in 4 seconds or less   Standing Unsupported, Alternately Place Feet on Step/Stool Able to complete >2 steps/needs minimal assist   Standing Unsupported, One Foot in [employer]  Able to take small step independently and hold 30 seconds   Standing on One Leg Tries to lift leg/unable to hold 3 seconds but remains standing independently   Total Score 45                     [facility] Adult PT Treatment/Exercise - 11/15/14 1100    Transfers   Sit to Stand 6: Modified independent (Device/Increase time);With upper extremity assist;From chair/3-in-1  no armrests on chair   Stand to Sit 6: Modified independent (Device/Increase time);To chair/3-in-1  no armrests on chair   Ambulation/Gait   Ambulation/Gait Yes   Ambulation/Gait Assistance 6: Modified independent (Device/Increase time)    Ambulation/Gait Assistance Details 100' without device, 750' with cane   Ambulation Distance (Feet) 750 Feet  750' with cane & 100' without device   Assistive device Prosthesis;Straight cane   Gait Pattern Step-through pattern   Ambulation Surface Indoor;Level;Outdoor;Paved;Grass   Gait velocity 2.98 ft/sec   Stairs Yes   Stairs Assistance 6: Modified independent (Device/Increase time)   Stair Management Technique One rail Right;One rail Left;Step to pattern;Forwards;With cane   Number of Stairs 4  x 2 reps   Ramp 6: Modified independent (Device)  with cane/prosthesis   Curb 6: Modified independent (Device/increase time)  with cane/prosthesis   Gait Comments no device for household level & cane for community   Prosthetics   Current prosthetic wear tolerance (days/week)  daily   Current prosthetic wear tolerance (#hours/day)  all awake hours  drying every 3-4 hours   Residual limb condition  intact, no open areas     Patient able to lift & carry 15# box independently and safely. He climbed A-frame ladder & simulated 2-handed tasks safely.              PT Short Term Goals - 10/18/14 1321    PT SHORT TERM GOAL #1   Title Patient verbalizes basic understanding of prosthetic care with PT cues for adjusting ply socks & problem solving issues.    Baseline MET 10/16/14   Time 4   Period Weeks   Status Achieved   PT SHORT TERM GOAL #2   Title tolerates wear >8 hours /day daily with no skin issues nor undue discomfort.    Baseline MET 10/16/14   Time 4   Period Weeks   Status Achieved   PT SHORT TERM GOAL #3   Title reaches 10" & to floor without UE support with supervision. (NEW Target Date: 11/13/14)   Baseline Can reach 10" and to floor to retrieve object but requires UE support on [facility]   Time 4   Period Weeks   Status Not Met   PT SHORT TERM GOAL #4   Title ambulates 500' with LRAD & prosthesis with supervision / cues. (Target Date: 10/19/2014)   Baseline met on 10/18/14  with walker   Time 4   Period Weeks   Status Achieved   PT SHORT TERM GOAL #5   Title negotiates ramp, curb and stairs with LRAD & prosthesis with supervision. (Target Date: 10/19/2014)   Baseline met on 10/18/14 with walker and rails   Time 4   Period Weeks   Status Achieved           PT Long Term Goals - 11/15/14 1100    PT LONG TERM GOAL #1   Title independent in prosthetic care. (Target Date: 11/16/2014)   Baseline MET 11/15/2014   Time 8   Period Weeks  Status Achieved   PT LONG TERM GOAL #2   Title Tolerates wear of prosthesis >90% of awake hours without skin issues or discomfort. (Target Date: 11/16/2014)   Baseline MET 11/18/2014   Time 8   Period Weeks   Status Achieved   PT LONG TERM GOAL #3   Title Berg Balance >45/56 (Target Date: 11/16/2014)   Baseline MET 18-Nov-2014 Merrilee Jansky 45/56   Time 8   Period Weeks   Status Achieved   PT LONG TERM GOAL #4   Title ambulates around household furniture carrying objects without device except prosthesis independently. (Target Date: 11/16/2014)   Baseline MET 11-18-2014   Time 8   Period Weeks   Status On-going   PT LONG TERM GOAL #5   Title ambulates 1000' on various surfaces with single point cane & prosthesis modified independent. (Target Date: 11/16/2014)   Time 8   Period Weeks   Status Achieved   PT LONG TERM GOAL #6   Title negotiates barriers including ramp, curb & stairs with single point cane & prosthesis modified independent. (Target Date: 11/16/2014)   Baseline MET Nov 18, 2014   Time 8   Period Weeks   Status Achieved   PT LONG TERM GOAL #7   Title demonstrates proper technique to safely lift, carry & climb A-frame ladders. (Target Date: 11/16/2014)   Baseline MET 2014-11-18   Time 8   Period Weeks   Status Achieved               Plan - 11-18-14 1100    Clinical Impression Statement Patient met all LTGs. He is currently able to ambulate household level with only prosthesis and community level with cane.    Pt will benefit from skilled therapeutic intervention in order to improve on the following deficits Abnormal gait;Decreased activity tolerance;Decreased balance;Decreased endurance;Decreased knowledge of use of DME;Decreased mobility;Decreased strength;Postural dysfunction;Other (comment)  prosthetic dependency   Rehab Potential Good   PT Frequency 2x / week   PT Duration 8 weeks   PT Treatment/Interventions ADLs/Self Care Home Management;DME Instruction;Gait training;Stair training;Functional mobility training;Therapeutic activities;Therapeutic exercise;Balance training;Neuromuscular re-education;Patient/family education;Other (comment)  prosthetic training   PT Next Visit Plan discharge today   Consulted and Agree with Plan of Care Patient          G-Codes - Nov 18, 2014 1502    Functional Assessment Tool Used wears prosthesis all awake hours, independent in prosthetic care   Functional Limitation Self care   Self Care Goal Status ([id number]) At least 1 percent but less than 20 percent impaired, limited or restricted   Self Care Discharge Status [phone]) At least 1 percent but less than 20 percent impaired, limited or restricted      Problem List Patient Active Problem List   Diagnosis Date Noted  . CKD stage 4 due to type 2 diabetes mellitus 08/22/2014  . S/P BKA (below knee amputation) unilateral 05/30/2014  . Complete amputation of right foot 04/23/2014  . End stage renal disease 04/11/2013  . Chronic hyperkalemia   . CKD (chronic kidney disease)   . Vitamin D deficiency   . Diabetes mellitus type 2 with complications   . Hypertension   . Prostate cancer   . Undescended testicle     Reginold Beale PT, DPT 11-18-14, 3:03 PM  [facility] [address] [place], [place], [address] Phone: [phone]   Fax:  [phone]

## 2014-11-15 NOTE — Therapy (Signed)
[facility] [address] [place], [place], [address] Phone: [phone]   Fax:  [phone]  Patient Details  Name: Phillip Herring MRN: [id number] Date of Birth: 03/26/1941 Referring Provider:  No ref. provider found  Encounter Date: 11/15/2014  PHYSICAL THERAPY DISCHARGE SUMMARY  Visits from Start of Care: 16  Current functional level related to goals / functional outcomes:     PT Long Term Goals - 11/15/14 1100    PT LONG TERM GOAL #1   Title independent in prosthetic care. (Target Date: 11/16/2014)   Baseline MET 11/15/2014   Time 8   Period Weeks   Status Achieved   PT LONG TERM GOAL #2   Title Tolerates wear of prosthesis >90% of awake hours without skin issues or discomfort. (Target Date: 11/16/2014)   Baseline MET 11/15/2014   Time 8   Period Weeks   Status Achieved   PT LONG TERM GOAL #3   Title Berg Balance >45/56 (Target Date: 11/16/2014)   Baseline MET 11/15/2014 Berg 45/56   Time 8   Period Weeks   Status Achieved   PT LONG TERM GOAL #4   Title ambulates around household furniture carrying objects without device except prosthesis independently. (Target Date: 11/16/2014)   Baseline MET 11/15/2014   Time 8   Period Weeks   Status On-going   PT LONG TERM GOAL #5   Title ambulates 1000' on various surfaces with single point cane & prosthesis modified independent. (Target Date: 11/16/2014)   Time 8   Period Weeks   Status Partially Achieved Distances of ~750'   PT LONG TERM GOAL #6   Title negotiates barriers including ramp, curb & stairs with single point cane & prosthesis modified independent. (Target Date: 11/16/2014)   Baseline MET 11/15/2014   Time 8   Period Weeks   Status Achieved   PT LONG TERM GOAL #7   Title demonstrates proper technique to safely lift, carry & climb A-frame ladders. (Target Date: 11/16/2014)   Baseline MET 11/15/2014   Time 8   Period Weeks   Status Achieved       Remaining  deficits: He is limited in distances by endurance but verbalizes how to progressively increase this distance.   Education / Equipment: Prosthetic care/ use & HEP  Plan: Patient agrees to discharge.  Patient goals were met. Patient is being discharged due to meeting the stated rehab goals.  ?????       WALDRON,ROBIN PT, DPT 11/15/2014, 3:06 PM  [facility] [address] [place], [place], [address] Phone: [phone]   Fax:  [phone] 

## 2014-11-18 ENCOUNTER — Encounter: Payer: Self-pay | Admitting: Podiatry

## 2014-11-18 NOTE — Progress Notes (Signed)
Patient ID: Phillip Herring, male   DOB: 1940/08/27, 74 y.o.   MRN: [id number]  Subjective: 74 y.o.-year-old male returns the office today for painful, elongated, thickened toenails which he is unable to trim himself. Denies any redness or drainage around the nails. Since last appointment he has gone the prosthetic for the right lower extremities currently in rehabilitation to work with him on this. Denies any acute changes since last appointment and no new complaints today. Denies any systemic complaints such as fevers, chills, nausea, vomiting.   Objective: AAO 3, NAD Right BKA DP/PT pulses palpable on the decrease, CRT less than 3 seconds Protective sensation intact with Simms Weinstein monofilament Nails hypertrophic, dystrophic, elongated, brittle, discolored 5. There is tenderness overlying the nails 1-5 on the left foot. There is no surrounding erythema or drainage along the nail sites. No open lesions or pre-ulcerative lesions are identified. There is dried skin present left foot. No other areas of tenderness bilateral lower extremities. No overlying edema, erythema, increased warmth. No pain with calf compression, swelling, warmth, erythema.  Assessment: Patient presents with symptomatic onychomycosis  Plan: -Treatment options including alternatives, risks, complications were discussed -Nails sharply debrided 5 without complication/bleeding. -Wish risers of the left foot daily. Do not apply interdigitally. -Discussed daily foot inspection. If there are any changes, to call the office immediately.  -Follow-up in 3 months or sooner if any problems are to arise. In the meantime, encouraged to call the office with any questions, concerns, changes symptoms.

## 2014-11-20 ENCOUNTER — Ambulatory Visit: Payer: Medicare PPO | Admitting: Physical Therapy

## 2014-11-22 ENCOUNTER — Encounter: Payer: Medicare PPO | Admitting: Physical Therapy

## 2014-11-23 ENCOUNTER — Encounter (HOSPITAL_COMMUNITY)
Admission: RE | Admit: 2014-11-23 | Discharge: 2014-11-23 | Disposition: A | Discharge status: Home / Self Care | Payer: Medicare PPO | Source: Ambulatory Visit | Attending: Nephrology | Admitting: Nephrology

## 2014-11-23 DIAGNOSIS — D631 Anemia in chronic kidney disease: Secondary | ICD-10-CM | POA: Insufficient documentation

## 2014-11-23 DIAGNOSIS — Z79899 Other long term (current) drug therapy: Secondary | ICD-10-CM | POA: Diagnosis not present

## 2014-11-23 DIAGNOSIS — N184 Chronic kidney disease, stage 4 (severe): Secondary | ICD-10-CM | POA: Diagnosis not present

## 2014-11-23 DIAGNOSIS — D509 Iron deficiency anemia, unspecified: Secondary | ICD-10-CM | POA: Diagnosis not present

## 2014-11-23 DIAGNOSIS — Z5181 Encounter for therapeutic drug level monitoring: Secondary | ICD-10-CM | POA: Diagnosis not present

## 2014-11-23 LAB — IRON AND TIBC
Iron: 92 ug/dL (ref 45–182)
Saturation Ratios: 35 % (ref 17.9–39.5)
TIBC: 265 ug/dL (ref 250–450)
UIBC: 173 ug/dL

## 2014-11-23 LAB — FERRITIN: FERRITIN: 128 ng/mL (ref 24–336)

## 2014-11-23 LAB — POCT HEMOGLOBIN-HEMACUE: HEMOGLOBIN: 10.8 g/dL — AB (ref 13.0–17.0)

## 2014-11-23 MED ORDER — DARBEPOETIN ALFA 100 MCG/0.5ML IJ SOSY
PREFILLED_SYRINGE | INTRAMUSCULAR | Status: AC
  Filled 2014-11-23: qty 0.5

## 2014-11-23 MED ORDER — DARBEPOETIN ALFA 100 MCG/0.5ML IJ SOSY
100.0000 ug | PREFILLED_SYRINGE | INTRAMUSCULAR | Status: DC
  Administered 2014-11-23: 100 ug via SUBCUTANEOUS

## 2014-12-04 ENCOUNTER — Encounter: Payer: Self-pay | Admitting: Endocrinology

## 2014-12-12 ENCOUNTER — Ambulatory Visit: Payer: Medicare PPO | Admitting: Endocrinology

## 2014-12-21 ENCOUNTER — Ambulatory Visit (HOSPITAL_COMMUNITY)
Admission: RE | Admit: 2014-12-21 | Discharge: 2014-12-21 | Disposition: A | Discharge status: Home / Self Care | Payer: Medicare PPO | Source: Ambulatory Visit | Attending: Nephrology | Admitting: Nephrology

## 2014-12-21 DIAGNOSIS — Z5181 Encounter for therapeutic drug level monitoring: Secondary | ICD-10-CM | POA: Insufficient documentation

## 2014-12-21 DIAGNOSIS — N184 Chronic kidney disease, stage 4 (severe): Secondary | ICD-10-CM | POA: Diagnosis not present

## 2014-12-21 DIAGNOSIS — D631 Anemia in chronic kidney disease: Secondary | ICD-10-CM | POA: Insufficient documentation

## 2014-12-21 DIAGNOSIS — Z79899 Other long term (current) drug therapy: Secondary | ICD-10-CM | POA: Insufficient documentation

## 2014-12-21 LAB — POCT HEMOGLOBIN-HEMACUE: Hemoglobin: 11.2 g/dL — ABNORMAL LOW (ref 13.0–17.0)

## 2014-12-21 MED ORDER — DARBEPOETIN ALFA 100 MCG/0.5ML IJ SOSY
PREFILLED_SYRINGE | INTRAMUSCULAR | Status: AC
  Filled 2014-12-21: qty 0.5

## 2014-12-21 MED ORDER — DARBEPOETIN ALFA 100 MCG/0.5ML IJ SOSY
100.0000 ug | PREFILLED_SYRINGE | INTRAMUSCULAR | Status: DC
  Administered 2014-12-21: 100 ug via SUBCUTANEOUS

## 2015-01-15 ENCOUNTER — Encounter: Payer: Self-pay | Admitting: Gastroenterology

## 2015-01-16 ENCOUNTER — Ambulatory Visit: Payer: Medicare PPO | Admitting: Endocrinology

## 2015-01-17 ENCOUNTER — Other Ambulatory Visit (HOSPITAL_COMMUNITY): Payer: Self-pay | Admitting: *Deleted

## 2015-01-18 ENCOUNTER — Ambulatory Visit (HOSPITAL_COMMUNITY)
Admission: RE | Admit: 2015-01-18 | Discharge: 2015-01-18 | Disposition: A | Discharge status: Home / Self Care | Payer: Medicare PPO | Source: Ambulatory Visit | Attending: Nephrology | Admitting: Nephrology

## 2015-01-18 DIAGNOSIS — Z5181 Encounter for therapeutic drug level monitoring: Secondary | ICD-10-CM | POA: Insufficient documentation

## 2015-01-18 DIAGNOSIS — N184 Chronic kidney disease, stage 4 (severe): Secondary | ICD-10-CM | POA: Diagnosis present

## 2015-01-18 DIAGNOSIS — D631 Anemia in chronic kidney disease: Secondary | ICD-10-CM | POA: Insufficient documentation

## 2015-01-18 DIAGNOSIS — Z79899 Other long term (current) drug therapy: Secondary | ICD-10-CM | POA: Diagnosis not present

## 2015-01-18 LAB — IRON AND TIBC
IRON: 60 ug/dL (ref 45–182)
SATURATION RATIOS: 26 % (ref 17.9–39.5)
TIBC: 231 ug/dL — ABNORMAL LOW (ref 250–450)
UIBC: 171 ug/dL

## 2015-01-18 LAB — POCT HEMOGLOBIN-HEMACUE: Hemoglobin: 11.4 g/dL — ABNORMAL LOW (ref 13.0–17.0)

## 2015-01-18 LAB — FERRITIN: FERRITIN: 102 ng/mL (ref 24–336)

## 2015-01-18 MED ORDER — DARBEPOETIN ALFA 100 MCG/0.5ML IJ SOSY
PREFILLED_SYRINGE | INTRAMUSCULAR | Status: AC
  Filled 2015-01-18: qty 0.5

## 2015-01-18 MED ORDER — DARBEPOETIN ALFA 100 MCG/0.5ML IJ SOSY
100.0000 ug | PREFILLED_SYRINGE | INTRAMUSCULAR | Status: DC
  Administered 2015-01-18: 100 ug via SUBCUTANEOUS

## 2015-02-11 ENCOUNTER — Ambulatory Visit: Payer: Medicare PPO | Admitting: Endocrinology

## 2015-02-15 ENCOUNTER — Ambulatory Visit (HOSPITAL_COMMUNITY)
Admission: RE | Admit: 2015-02-15 | Discharge: 2015-02-15 | Disposition: A | Discharge status: Home / Self Care | Payer: Medicare PPO | Source: Ambulatory Visit | Attending: Nephrology | Admitting: Nephrology

## 2015-02-15 DIAGNOSIS — Z79899 Other long term (current) drug therapy: Secondary | ICD-10-CM | POA: Insufficient documentation

## 2015-02-15 DIAGNOSIS — D631 Anemia in chronic kidney disease: Secondary | ICD-10-CM | POA: Insufficient documentation

## 2015-02-15 DIAGNOSIS — Z5181 Encounter for therapeutic drug level monitoring: Secondary | ICD-10-CM | POA: Diagnosis not present

## 2015-02-15 DIAGNOSIS — N184 Chronic kidney disease, stage 4 (severe): Secondary | ICD-10-CM | POA: Insufficient documentation

## 2015-02-15 LAB — IRON AND TIBC
IRON: 64 ug/dL (ref 45–182)
SATURATION RATIOS: 23 % (ref 17.9–39.5)
TIBC: 277 ug/dL (ref 250–450)
UIBC: 213 ug/dL

## 2015-02-15 LAB — POCT HEMOGLOBIN-HEMACUE: Hemoglobin: 11.5 g/dL — ABNORMAL LOW (ref 13.0–17.0)

## 2015-02-15 LAB — FERRITIN: FERRITIN: 97 ng/mL (ref 24–336)

## 2015-02-15 MED ORDER — DARBEPOETIN ALFA 100 MCG/0.5ML IJ SOSY
PREFILLED_SYRINGE | INTRAMUSCULAR | Status: AC
  Administered 2015-02-15: 100 ug via SUBCUTANEOUS
  Filled 2015-02-15: qty 0.5

## 2015-02-15 MED ORDER — DARBEPOETIN ALFA 100 MCG/0.5ML IJ SOSY
100.0000 ug | PREFILLED_SYRINGE | INTRAMUSCULAR | Status: DC
  Administered 2015-02-15: 100 ug via SUBCUTANEOUS

## 2015-02-18 ENCOUNTER — Ambulatory Visit: Payer: Medicare PPO

## 2015-03-15 ENCOUNTER — Ambulatory Visit (HOSPITAL_COMMUNITY)
Admission: RE | Admit: 2015-03-15 | Discharge: 2015-03-15 | Disposition: A | Discharge status: Home / Self Care | Payer: Medicare PPO | Source: Ambulatory Visit | Attending: Nephrology | Admitting: Nephrology

## 2015-03-15 DIAGNOSIS — D631 Anemia in chronic kidney disease: Secondary | ICD-10-CM | POA: Insufficient documentation

## 2015-03-15 DIAGNOSIS — Z5181 Encounter for therapeutic drug level monitoring: Secondary | ICD-10-CM | POA: Insufficient documentation

## 2015-03-15 DIAGNOSIS — Z79899 Other long term (current) drug therapy: Secondary | ICD-10-CM | POA: Insufficient documentation

## 2015-03-15 DIAGNOSIS — N184 Chronic kidney disease, stage 4 (severe): Secondary | ICD-10-CM | POA: Diagnosis not present

## 2015-03-15 LAB — IRON AND TIBC
IRON: 72 ug/dL (ref 45–182)
Saturation Ratios: 25 % (ref 17.9–39.5)
TIBC: 283 ug/dL (ref 250–450)
UIBC: 211 ug/dL

## 2015-03-15 LAB — FERRITIN: FERRITIN: 122 ng/mL (ref 24–336)

## 2015-03-15 MED ORDER — DARBEPOETIN ALFA 100 MCG/0.5ML IJ SOSY
PREFILLED_SYRINGE | INTRAMUSCULAR | Status: AC
  Filled 2015-03-15: qty 0.5

## 2015-03-15 MED ORDER — DARBEPOETIN ALFA 100 MCG/0.5ML IJ SOSY
100.0000 ug | PREFILLED_SYRINGE | INTRAMUSCULAR | Status: DC
  Administered 2015-03-15: 100 ug via SUBCUTANEOUS

## 2015-03-18 LAB — POCT HEMOGLOBIN-HEMACUE: Hemoglobin: 10.9 g/dL — ABNORMAL LOW (ref 13.0–17.0)

## 2015-04-12 ENCOUNTER — Encounter (HOSPITAL_COMMUNITY)
Admission: RE | Admit: 2015-04-12 | Discharge: 2015-04-12 | Disposition: A | Discharge status: Home / Self Care | Payer: Medicare PPO | Source: Ambulatory Visit | Attending: Nephrology | Admitting: Nephrology

## 2015-04-12 DIAGNOSIS — D631 Anemia in chronic kidney disease: Secondary | ICD-10-CM | POA: Diagnosis not present

## 2015-04-12 DIAGNOSIS — N184 Chronic kidney disease, stage 4 (severe): Secondary | ICD-10-CM | POA: Diagnosis not present

## 2015-04-12 DIAGNOSIS — Z79899 Other long term (current) drug therapy: Secondary | ICD-10-CM | POA: Insufficient documentation

## 2015-04-12 DIAGNOSIS — Z5181 Encounter for therapeutic drug level monitoring: Secondary | ICD-10-CM | POA: Diagnosis not present

## 2015-04-12 LAB — IRON AND TIBC
Iron: 72 ug/dL (ref 45–182)
SATURATION RATIOS: 27 % (ref 17.9–39.5)
TIBC: 269 ug/dL (ref 250–450)
UIBC: 197 ug/dL

## 2015-04-12 LAB — POCT HEMOGLOBIN-HEMACUE: Hemoglobin: 11.1 g/dL — ABNORMAL LOW (ref 13.0–17.0)

## 2015-04-12 LAB — FERRITIN: Ferritin: 111 ng/mL (ref 24–336)

## 2015-04-12 MED ORDER — DARBEPOETIN ALFA 100 MCG/0.5ML IJ SOSY
PREFILLED_SYRINGE | INTRAMUSCULAR | Status: AC
  Filled 2015-04-12: qty 0.5

## 2015-04-12 MED ORDER — DARBEPOETIN ALFA 100 MCG/0.5ML IJ SOSY
100.0000 ug | PREFILLED_SYRINGE | INTRAMUSCULAR | Status: DC
  Administered 2015-04-12: 100 ug via SUBCUTANEOUS

## 2015-05-10 ENCOUNTER — Ambulatory Visit (HOSPITAL_COMMUNITY)
Admission: RE | Admit: 2015-05-10 | Discharge: 2015-05-10 | Disposition: A | Discharge status: Home / Self Care | Payer: Medicare PPO | Source: Ambulatory Visit | Attending: Nephrology | Admitting: Nephrology

## 2015-05-10 DIAGNOSIS — D631 Anemia in chronic kidney disease: Secondary | ICD-10-CM | POA: Diagnosis not present

## 2015-05-10 DIAGNOSIS — Z5181 Encounter for therapeutic drug level monitoring: Secondary | ICD-10-CM | POA: Diagnosis not present

## 2015-05-10 DIAGNOSIS — Z79899 Other long term (current) drug therapy: Secondary | ICD-10-CM | POA: Diagnosis not present

## 2015-05-10 DIAGNOSIS — N184 Chronic kidney disease, stage 4 (severe): Secondary | ICD-10-CM | POA: Insufficient documentation

## 2015-05-10 LAB — IRON AND TIBC
Iron: 51 ug/dL (ref 45–182)
SATURATION RATIOS: 19 % (ref 17.9–39.5)
TIBC: 272 ug/dL (ref 250–450)
UIBC: 221 ug/dL

## 2015-05-10 LAB — POCT HEMOGLOBIN-HEMACUE: Hemoglobin: 11 g/dL — ABNORMAL LOW (ref 13.0–17.0)

## 2015-05-10 LAB — FERRITIN: FERRITIN: 92 ng/mL (ref 24–336)

## 2015-05-10 MED ORDER — DARBEPOETIN ALFA 100 MCG/0.5ML IJ SOSY
100.0000 ug | PREFILLED_SYRINGE | INTRAMUSCULAR | Status: DC
  Administered 2015-05-10: 100 ug via SUBCUTANEOUS

## 2015-05-10 MED ORDER — DARBEPOETIN ALFA 100 MCG/0.5ML IJ SOSY
PREFILLED_SYRINGE | INTRAMUSCULAR | Status: AC
  Filled 2015-05-10: qty 0.5

## 2015-05-26 DEATH — deceased

## 2015-06-07 ENCOUNTER — Encounter (HOSPITAL_COMMUNITY): Payer: Medicare PPO

## 2016-02-13 IMAGING — CR DG CHEST 2V
2 series · 2 of 2 positions shown · non-contrast
Comparison: 04/13/2013

CLINICAL DATA: Preop for below knee amputation, right foot
osteomyelitis is

EXAM:
CHEST  2 VIEW

[w chest pa]
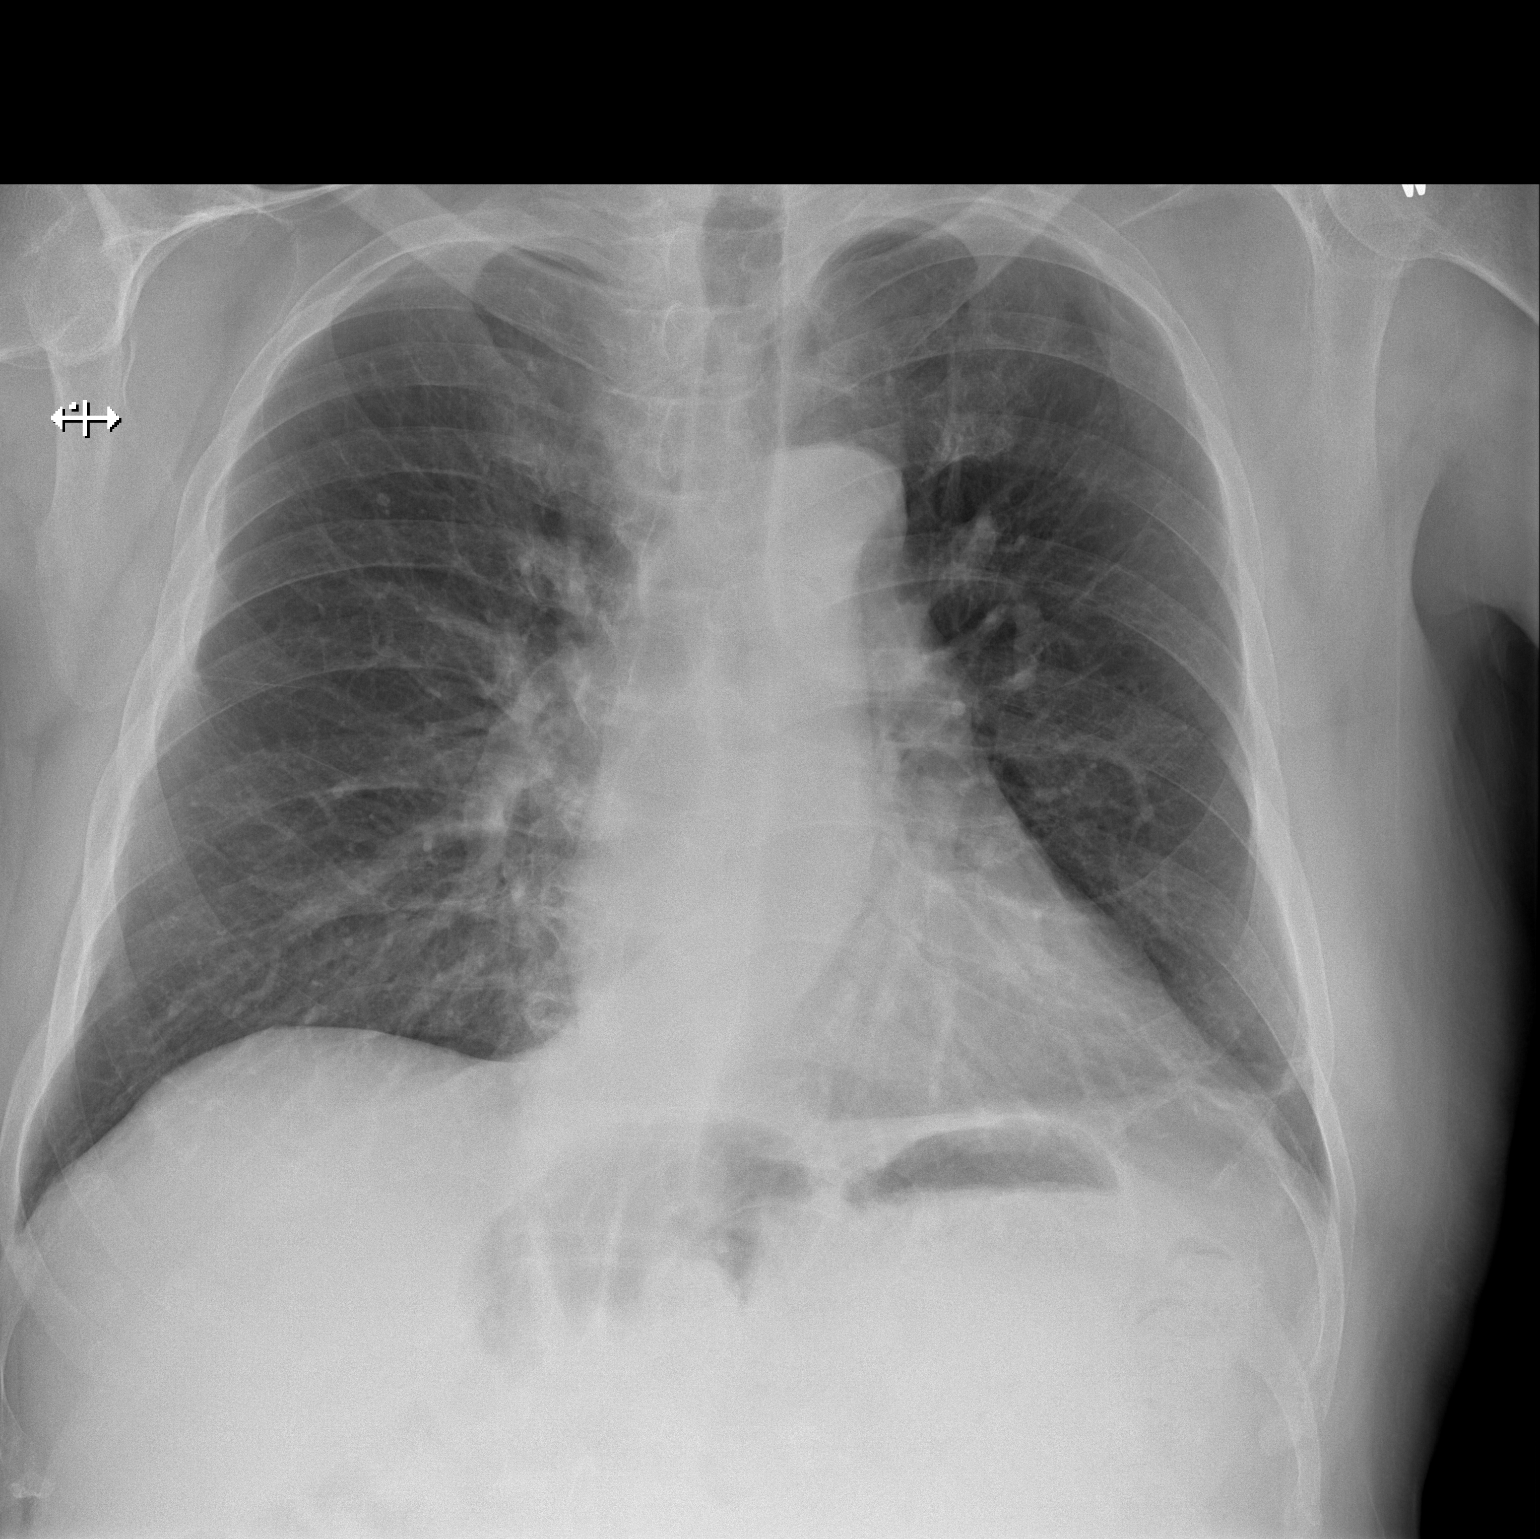

[w chest lat]
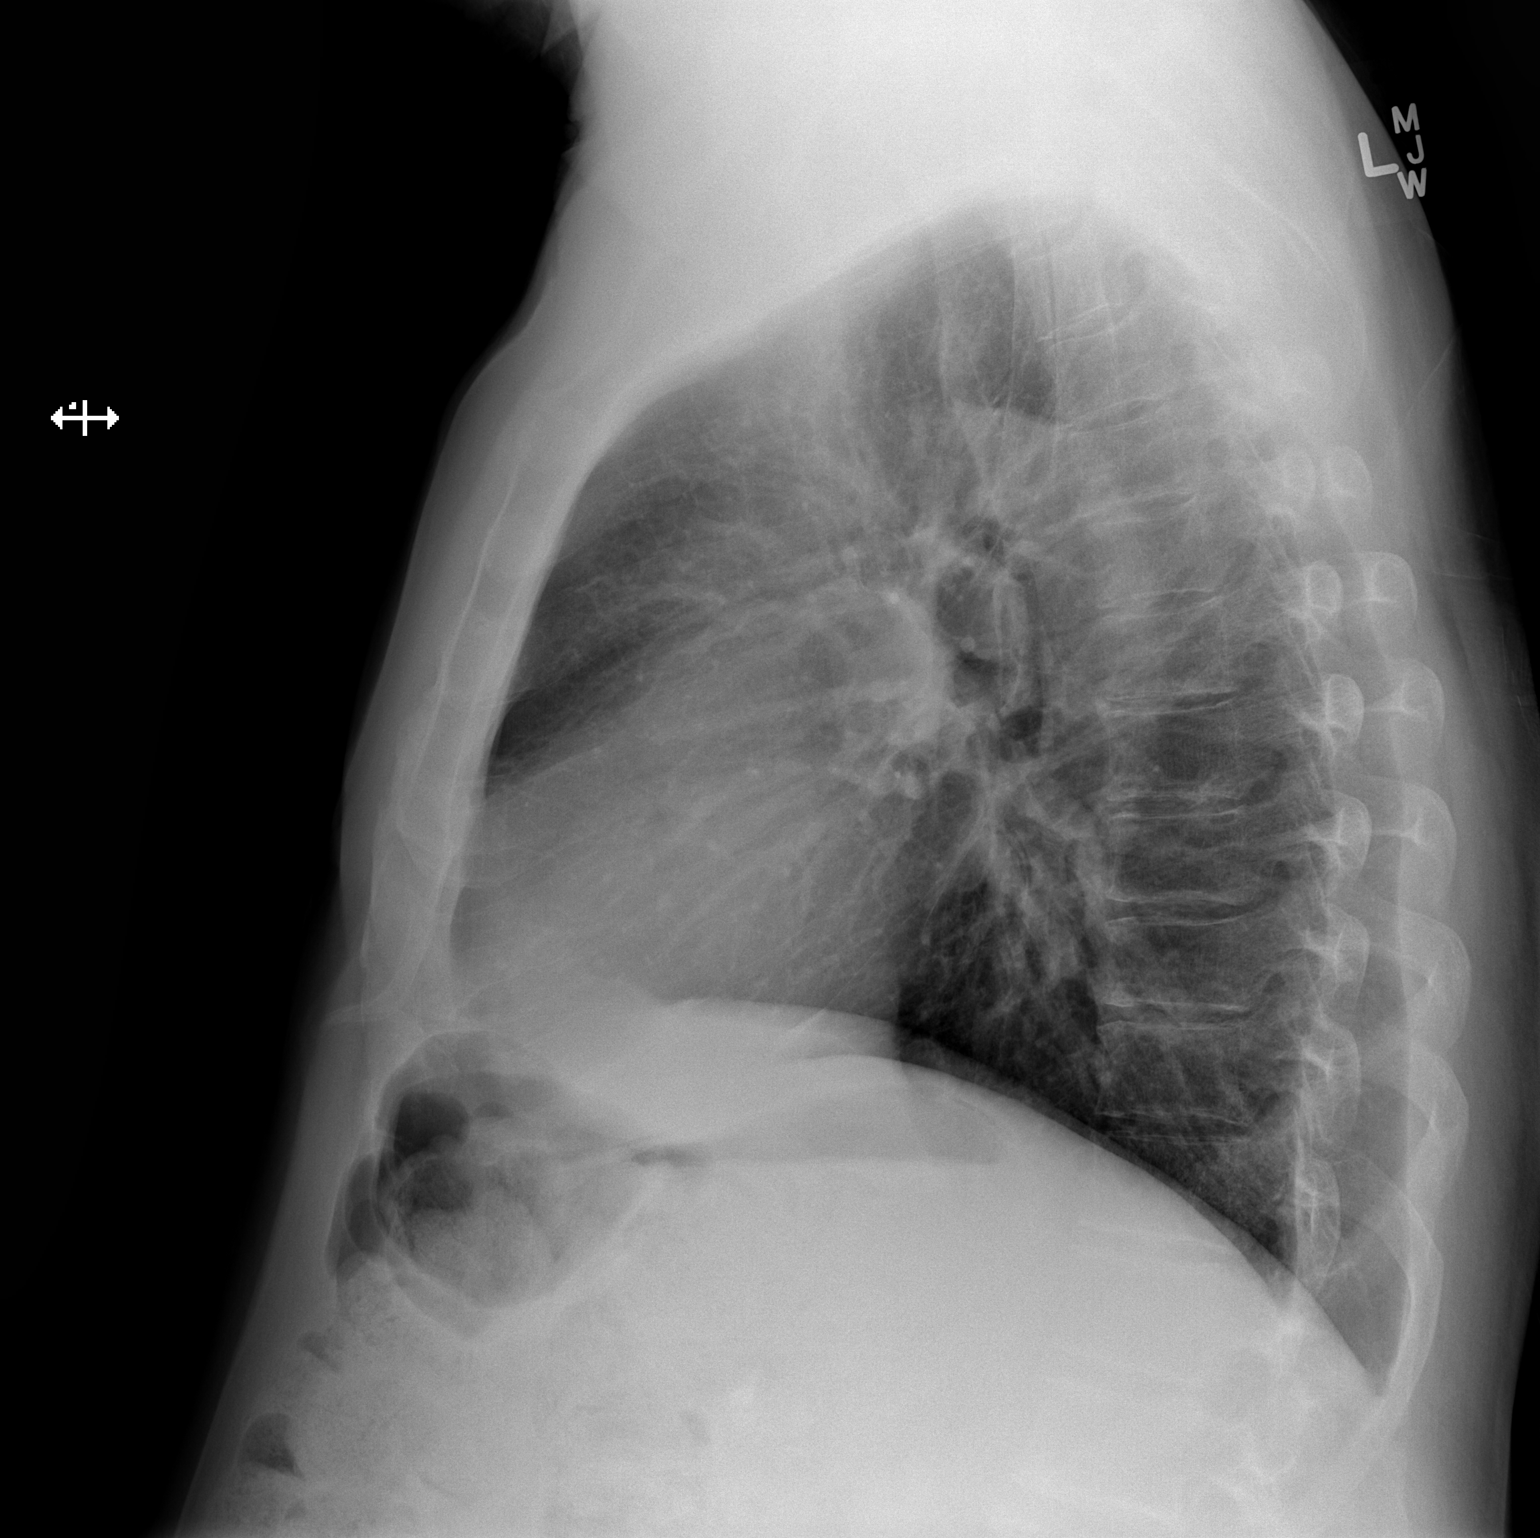

[2 of 2 positions shown; findings below may reference images not displayed]

FINDINGS: Cardiomediastinal silhouette is stable. No acute infiltrate or
pleural effusion. No pulmonary edema. Minimal degenerative changes
lower thoracic spine. Stable linear scarring left base.
IMPRESSION: No active cardiopulmonary disease.
